# Patient Record
Sex: Female | Born: 1955
Health system: Southern US, Community
[De-identification: ages and names within clinical notes are randomized; demographics above are authoritative.]

## PROBLEM LIST (undated history)

## (undated) DIAGNOSIS — T464X5A Adverse effect of angiotensin-converting-enzyme inhibitors, initial encounter: Secondary | ICD-10-CM

## (undated) DIAGNOSIS — K5909 Other constipation: Secondary | ICD-10-CM

## (undated) DIAGNOSIS — E669 Obesity, unspecified: Secondary | ICD-10-CM

## (undated) DIAGNOSIS — D649 Anemia, unspecified: Secondary | ICD-10-CM

## (undated) DIAGNOSIS — M1990 Unspecified osteoarthritis, unspecified site: Secondary | ICD-10-CM

## (undated) DIAGNOSIS — R058 Other specified cough: Secondary | ICD-10-CM

## (undated) DIAGNOSIS — R05 Cough: Secondary | ICD-10-CM

## (undated) DIAGNOSIS — I10 Essential (primary) hypertension: Secondary | ICD-10-CM

## (undated) HISTORY — DX: Adverse effect of angiotensin-converting-enzyme inhibitors, initial encounter: T46.4X5A

## (undated) HISTORY — DX: Other constipation: K59.09

## (undated) HISTORY — DX: Essential (primary) hypertension: I10

## (undated) HISTORY — DX: Obesity, unspecified: E66.9

## (undated) HISTORY — DX: Other specified cough: R05.8

## (undated) HISTORY — PX: COLONOSCOPY: SHX174

## (undated) HISTORY — DX: Cough: R05

---

## 2000-07-12 ENCOUNTER — Emergency Department (HOSPITAL_COMMUNITY): Admission: EM | Admit: 2000-07-12 | Discharge: 2000-07-13 | Payer: Self-pay

## 2000-07-14 ENCOUNTER — Encounter: Payer: Self-pay | Admitting: Family Medicine

## 2000-07-14 ENCOUNTER — Encounter: Admission: RE | Admit: 2000-07-14 | Discharge: 2000-07-14 | Payer: Self-pay | Admitting: Family Medicine

## 2000-07-17 ENCOUNTER — Encounter: Admission: RE | Admit: 2000-07-17 | Discharge: 2000-08-17 | Payer: Self-pay | Admitting: Family Medicine

## 2000-07-21 ENCOUNTER — Encounter: Admission: RE | Admit: 2000-07-21 | Discharge: 2000-07-21 | Payer: Self-pay | Admitting: Family Medicine

## 2000-07-21 ENCOUNTER — Encounter: Payer: Self-pay | Admitting: Family Medicine

## 2002-03-17 ENCOUNTER — Emergency Department (HOSPITAL_COMMUNITY): Admission: EM | Admit: 2002-03-17 | Discharge: 2002-03-17 | Payer: Self-pay | Admitting: Emergency Medicine

## 2002-03-17 ENCOUNTER — Encounter: Payer: Self-pay | Admitting: Emergency Medicine

## 2002-03-28 ENCOUNTER — Other Ambulatory Visit: Admission: RE | Admit: 2002-03-28 | Discharge: 2002-03-28 | Payer: Self-pay | Admitting: Family Medicine

## 2003-03-17 ENCOUNTER — Ambulatory Visit (HOSPITAL_BASED_OUTPATIENT_CLINIC_OR_DEPARTMENT_OTHER): Admission: RE | Admit: 2003-03-17 | Discharge: 2003-03-17 | Payer: Self-pay | Admitting: Specialist

## 2003-10-13 ENCOUNTER — Emergency Department (HOSPITAL_COMMUNITY): Admission: EM | Admit: 2003-10-13 | Discharge: 2003-10-13 | Payer: Self-pay | Admitting: Emergency Medicine

## 2004-10-17 ENCOUNTER — Emergency Department (HOSPITAL_COMMUNITY): Admission: EM | Admit: 2004-10-17 | Discharge: 2004-10-17 | Payer: Self-pay | Admitting: Emergency Medicine

## 2004-10-18 ENCOUNTER — Ambulatory Visit: Payer: Self-pay | Admitting: Internal Medicine

## 2004-10-26 ENCOUNTER — Ambulatory Visit: Payer: Self-pay | Admitting: Internal Medicine

## 2006-03-23 ENCOUNTER — Emergency Department (HOSPITAL_COMMUNITY): Admission: EM | Admit: 2006-03-23 | Discharge: 2006-03-24 | Payer: Self-pay | Admitting: Emergency Medicine

## 2006-03-29 ENCOUNTER — Ambulatory Visit: Payer: Self-pay | Admitting: Internal Medicine

## 2006-05-09 ENCOUNTER — Ambulatory Visit: Payer: Self-pay | Admitting: Internal Medicine

## 2006-05-11 ENCOUNTER — Ambulatory Visit: Payer: Self-pay | Admitting: Internal Medicine

## 2006-05-11 ENCOUNTER — Other Ambulatory Visit: Admission: RE | Admit: 2006-05-11 | Discharge: 2006-05-11 | Payer: Self-pay | Admitting: Internal Medicine

## 2007-06-25 LAB — CONVERTED CEMR LAB: Pap Smear: NORMAL

## 2007-08-29 ENCOUNTER — Emergency Department (HOSPITAL_COMMUNITY): Admission: EM | Admit: 2007-08-29 | Discharge: 2007-08-29 | Payer: Self-pay | Admitting: Emergency Medicine

## 2007-11-16 ENCOUNTER — Encounter: Admission: RE | Admit: 2007-11-16 | Discharge: 2007-11-16 | Payer: Self-pay | Admitting: Family Medicine

## 2008-03-07 HISTORY — PX: OTHER SURGICAL HISTORY: SHX169

## 2008-03-10 ENCOUNTER — Inpatient Hospital Stay (HOSPITAL_COMMUNITY): Admission: RE | Admit: 2008-03-10 | Discharge: 2008-03-13 | Payer: Self-pay | Admitting: Orthopedic Surgery

## 2008-03-12 ENCOUNTER — Encounter (INDEPENDENT_AMBULATORY_CARE_PROVIDER_SITE_OTHER): Payer: Self-pay | Admitting: Orthopedic Surgery

## 2008-03-12 ENCOUNTER — Ambulatory Visit: Payer: Self-pay | Admitting: Vascular Surgery

## 2008-04-01 ENCOUNTER — Encounter (INDEPENDENT_AMBULATORY_CARE_PROVIDER_SITE_OTHER): Payer: Self-pay | Admitting: *Deleted

## 2008-05-18 ENCOUNTER — Emergency Department (HOSPITAL_COMMUNITY): Admission: EM | Admit: 2008-05-18 | Discharge: 2008-05-18 | Payer: Self-pay | Admitting: Emergency Medicine

## 2008-06-19 ENCOUNTER — Ambulatory Visit: Payer: Self-pay | Admitting: Internal Medicine

## 2008-06-19 ENCOUNTER — Ambulatory Visit (HOSPITAL_BASED_OUTPATIENT_CLINIC_OR_DEPARTMENT_OTHER): Admission: RE | Admit: 2008-06-19 | Discharge: 2008-06-19 | Payer: Self-pay | Admitting: Internal Medicine

## 2008-06-19 DIAGNOSIS — R030 Elevated blood-pressure reading, without diagnosis of hypertension: Secondary | ICD-10-CM

## 2008-06-19 DIAGNOSIS — K5909 Other constipation: Secondary | ICD-10-CM | POA: Insufficient documentation

## 2008-06-19 LAB — HM MAMMOGRAPHY

## 2008-06-24 LAB — CONVERTED CEMR LAB: Pap Smear: NORMAL

## 2008-07-04 ENCOUNTER — Encounter: Payer: Self-pay | Admitting: Internal Medicine

## 2008-07-04 LAB — CONVERTED CEMR LAB
ALT: 27 units/L
AST: 76 units/L
Alkaline Phosphatase: 117 units/L
BUN: 6 mg/dL
Chloride: 106 meq/L
Cholesterol: 163 mg/dL
Creatinine, Ser: 0.68 mg/dL
Free T4: 1.31 ng/dL
HCT: 38.3 %
HDL: 46 mg/dL
Hemoglobin: 12.4 g/dL
LDL Cholesterol: 94 mg/dL
Platelets: 306 10*3/uL
Potassium: 4.5 meq/L
Sodium: 141 meq/L
TSH: 1.494 microintl units/mL
Total Bilirubin: 0.4 mg/dL
Total Protein: 7.8 g/dL
Triglycerides: 115 mg/dL
WBC: 7.1 10*3/uL

## 2008-08-07 ENCOUNTER — Ambulatory Visit: Payer: Self-pay | Admitting: Internal Medicine

## 2008-08-07 DIAGNOSIS — R740 Nonspecific elevation of levels of transaminase and lactic acid dehydrogenase [LDH]: Secondary | ICD-10-CM

## 2008-08-19 ENCOUNTER — Telehealth: Payer: Self-pay | Admitting: Internal Medicine

## 2008-08-19 ENCOUNTER — Ambulatory Visit: Payer: Self-pay | Admitting: Internal Medicine

## 2008-08-19 DIAGNOSIS — K921 Melena: Secondary | ICD-10-CM

## 2008-08-19 LAB — CONVERTED CEMR LAB: Fecal Occult Bld: POSITIVE — AB

## 2008-08-25 ENCOUNTER — Encounter: Payer: Self-pay | Admitting: Internal Medicine

## 2008-09-15 ENCOUNTER — Ambulatory Visit: Payer: Self-pay | Admitting: Internal Medicine

## 2008-09-15 LAB — CONVERTED CEMR LAB
ALT: 5 units/L (ref 0–35)
AST: 16 units/L (ref 0–37)
BUN: 4 mg/dL — ABNORMAL LOW (ref 6–23)
CO2: 28 meq/L (ref 19–32)
Calcium: 9.1 mg/dL (ref 8.4–10.5)
Chloride: 107 meq/L (ref 96–112)
Creatinine, Ser: 0.7 mg/dL (ref 0.4–1.2)
GFR calc Af Amer: 113 mL/min
GFR calc non Af Amer: 94 mL/min
Glucose, Bld: 112 mg/dL — ABNORMAL HIGH (ref 70–99)
Hgb A1c MFr Bld: 5.8 % (ref 4.6–6.0)
Potassium: 4 meq/L (ref 3.5–5.1)
Sodium: 140 meq/L (ref 135–145)

## 2008-09-16 ENCOUNTER — Ambulatory Visit: Payer: Self-pay | Admitting: Gastroenterology

## 2008-09-16 DIAGNOSIS — K5900 Constipation, unspecified: Secondary | ICD-10-CM

## 2008-09-16 DIAGNOSIS — R195 Other fecal abnormalities: Secondary | ICD-10-CM

## 2008-09-19 ENCOUNTER — Ambulatory Visit: Payer: Self-pay | Admitting: Gastroenterology

## 2008-09-19 LAB — HM COLONOSCOPY

## 2008-10-06 ENCOUNTER — Ambulatory Visit: Payer: Self-pay | Admitting: Internal Medicine

## 2008-10-06 DIAGNOSIS — I10 Essential (primary) hypertension: Secondary | ICD-10-CM | POA: Insufficient documentation

## 2008-10-06 DIAGNOSIS — R609 Edema, unspecified: Secondary | ICD-10-CM | POA: Insufficient documentation

## 2008-11-12 ENCOUNTER — Telehealth (INDEPENDENT_AMBULATORY_CARE_PROVIDER_SITE_OTHER): Payer: Self-pay | Admitting: *Deleted

## 2009-01-20 ENCOUNTER — Encounter: Payer: Self-pay | Admitting: Internal Medicine

## 2009-01-20 ENCOUNTER — Telehealth (INDEPENDENT_AMBULATORY_CARE_PROVIDER_SITE_OTHER): Payer: Self-pay | Admitting: *Deleted

## 2009-01-21 ENCOUNTER — Ambulatory Visit: Payer: Self-pay | Admitting: Internal Medicine

## 2009-04-24 ENCOUNTER — Ambulatory Visit: Payer: Self-pay | Admitting: Internal Medicine

## 2009-04-24 ENCOUNTER — Telehealth: Payer: Self-pay | Admitting: Internal Medicine

## 2009-04-24 DIAGNOSIS — N951 Menopausal and female climacteric states: Secondary | ICD-10-CM | POA: Insufficient documentation

## 2009-06-19 ENCOUNTER — Ambulatory Visit: Payer: Self-pay | Admitting: Internal Medicine

## 2009-06-19 LAB — CONVERTED CEMR LAB
ALT: 11 units/L (ref 0–35)
AST: 23 units/L (ref 0–37)
Albumin: 3.5 g/dL (ref 3.5–5.2)
Alkaline Phosphatase: 67 units/L (ref 39–117)
BUN: 6 mg/dL (ref 6–23)
Bilirubin, Direct: 0 mg/dL (ref 0.0–0.3)
CO2: 26 meq/L (ref 19–32)
Calcium: 8.9 mg/dL (ref 8.4–10.5)
Chloride: 106 meq/L (ref 96–112)
Cholesterol: 168 mg/dL (ref 0–200)
Creatinine, Ser: 0.7 mg/dL (ref 0.4–1.2)
FSH: 22.5 milliintl units/mL
GFR calc non Af Amer: 112.74 mL/min (ref >60)
Glucose, Bld: 106 mg/dL — ABNORMAL HIGH (ref 70–99)
HDL: 51.9 mg/dL (ref >39.00)
Hgb A1c MFr Bld: 5.8 % (ref 4.6–6.5)
LDL Cholesterol: 103 mg/dL — ABNORMAL HIGH (ref 0–99)
LH: 27.26 milliintl units/mL
Potassium: 3.6 meq/L (ref 3.5–5.1)
Sodium: 138 meq/L (ref 135–145)
TSH: 1.51 microintl units/mL (ref 0.35–5.50)
Total Bilirubin: 0.6 mg/dL (ref 0.3–1.2)
Total CHOL/HDL Ratio: 3
Total Protein: 7 g/dL (ref 6.0–8.3)
Triglycerides: 65 mg/dL (ref 0.0–149.0)
VLDL: 13 mg/dL (ref 0.0–40.0)

## 2009-06-24 ENCOUNTER — Ambulatory Visit: Payer: Self-pay | Admitting: Internal Medicine

## 2010-02-01 ENCOUNTER — Ambulatory Visit: Payer: Self-pay | Admitting: Internal Medicine

## 2010-02-02 ENCOUNTER — Telehealth: Payer: Self-pay | Admitting: Internal Medicine

## 2010-05-19 ENCOUNTER — Ambulatory Visit: Payer: Self-pay | Admitting: Internal Medicine

## 2010-05-19 LAB — CONVERTED CEMR LAB
BUN: 14 mg/dL (ref 6–23)
CO2: 25 meq/L (ref 19–32)
Calcium: 10.4 mg/dL (ref 8.4–10.5)
Chloride: 105 meq/L (ref 96–112)
Creatinine, Ser: 0.9 mg/dL (ref 0.40–1.20)
Glucose, Bld: 90 mg/dL (ref 70–99)
Hep B S Ab: POSITIVE — AB
Hgb A1c MFr Bld: 5.8 % — ABNORMAL HIGH (ref <5.7)
Potassium: 4.5 meq/L (ref 3.5–5.3)
Sodium: 141 meq/L (ref 135–145)
Varicella IgG: 2.25 — ABNORMAL HIGH

## 2010-05-21 ENCOUNTER — Encounter: Payer: Self-pay | Admitting: Internal Medicine

## 2010-05-26 ENCOUNTER — Ambulatory Visit: Payer: Self-pay | Admitting: Internal Medicine

## 2010-05-28 ENCOUNTER — Encounter: Payer: Self-pay | Admitting: Internal Medicine

## 2010-06-02 ENCOUNTER — Ambulatory Visit: Payer: Self-pay | Admitting: Internal Medicine

## 2010-11-02 ENCOUNTER — Ambulatory Visit: Payer: Self-pay | Admitting: Internal Medicine

## 2010-11-09 ENCOUNTER — Ambulatory Visit: Admit: 2010-11-09 | Payer: Self-pay | Admitting: Internal Medicine

## 2010-11-27 ENCOUNTER — Encounter: Payer: Self-pay | Admitting: Internal Medicine

## 2010-11-28 ENCOUNTER — Encounter: Payer: Self-pay | Admitting: Internal Medicine

## 2010-12-09 NOTE — Assessment & Plan Note (Signed)
Summary: t b test/mhf  Nurse Visit   Allergies: 1)  Benazepril Hcl (Benazepril Hcl)  Immunizations Administered:  PPD Skin Test:    Vaccine Type: PPD    Site: right forearm    Mfr: Sanofi Pasteur    Dose: 0.1 ml    Route: ID    Given by: Glendell Docker CMA    Exp. 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

## 2010-12-09 NOTE — Assessment & Plan Note (Signed)
Summary: TB Skin Test Recheck  Nurse Visit   Allergies: 1)  Benazepril Hcl (Benazepril Hcl)  PPD Results    Date of reading: 05/21/2010    Results: < 5mm    Interpretation: negative  Primary Care Provider:  Dondra Spry DO  CC:  Tb Skin Test Recheck.  History of Present Illness: The patient presented after 48 hours to check the injection site for positive or negative reaction  Injection site examination: No firm bump forms at the test site. Slightly reddish appearance and daimeter was smaller than 5mm.  Assessment & Plan:  Negative TB skin test. Patient was counseled to call if experience any irritation of site.

## 2010-12-09 NOTE — Progress Notes (Signed)
Summary: Tickle in Throat  ---- Converted from flag ---- ---- 02/02/2010 1:37 PM, D. Thomos Lemons DO wrote: call pt - I suggest she take otc claritin or zyrtec for tickle in her throat ------------------------------  Phone Note Outgoing Call   Call placed by: Glendell Docker CMA,  February 02, 2010 3:00 PM Call placed to: Patient Summary of Call: patient advised per Dr Artist Pais instructions Initial call taken by: Glendell Docker CMA,  February 02, 2010 3:01 PM

## 2010-12-09 NOTE — Assessment & Plan Note (Signed)
Summary: tb test/mhf  Nurse Visit  CC: Pt here for 2nd TB test. Will return Friday to have test read.   Allergies: 1)  Benazepril Hcl (Benazepril Hcl)  Immunizations Administered:  PPD Skin Test:    Vaccine Type: PPD    Site: right forearm    Mfr: Sanofi Pasteur    Dose: 0.1 ml    Route: ID    Given by: Mervin Kung CMA (AAMA)    Exp. Date: 09/05/2011    Lot #: [id number]  Orders Added: 1)  TB Skin Test [86580] 2)  Admin 1st Vaccine [phone]

## 2010-12-09 NOTE — Assessment & Plan Note (Signed)
Summary: CPX PATIENT FASTING/MHF   Vital Signs:  Patient profile:   55 year old female Height:      67 inches Weight:      247.25 pounds BMI:     38.86 O2 Sat:      100 % on Room air Temp:     98.1 degrees F oral Pulse rate:   64 / minute Pulse rhythm:   regular Resp:     16 per minute BP sitting:   110 / 70  (left arm) Cuff size:   large  Vitals Entered By: Glendell Docker CMA (June 02, 2010 10:37 AM)  O2 Flow:  Room air  Primary Care Provider:  D. Thomos Lemons DO   History of Present Illness: 55 y/o female with hx of Htn for routine cpx   Preventive Screening-Counseling & Management  Alcohol-Tobacco     Alcohol drinks/day: 0     Smoking Status: never  Caffeine-Diet-Exercise     Caffeine use/day: 2 beverages daily     Does Patient Exercise: yes     Type of exercise: walking   Allergies: 1)  Benazepril Hcl (Benazepril Hcl)  Past History:  Past Medical History: Obesity Hypertension  Chronic consiptation    Hemorrhoids  ACE cough  Past Surgical History: Left knee replacement 03/2008 C-section x 3        Family History: Father deceased in his late 18s secondary to alcoholism, HTN Mother is age 61-hypertension, stroke, kidney failure, brain aneurysm (mother is completely bedridden and patient is primary caregiver)  grandparents with HTN        Social History: Occupation:  Works as a Lawyer Married (second marriage) 2 children from her first marriage and 1 child from second marriage Never Smoked Alcohol use-no       Caffeine use/day:  2 beverages daily Does Patient Exercise:  yes  Physical Exam  General:  alert, well-developed, and well-nourished.   Eyes:  pupils equal, pupils round, and pupils reactive to light.   Ears:  R ear normal and L ear normal.   Mouth:  pharynx pink and moist.   Neck:  No deformities, masses, or tenderness noted.no carotid bruits.   Lungs:  Normal respiratory effort, chest expands symmetrically. Lungs are clear to  auscultation, no crackles or wheezes. Heart:  Normal rate and regular rhythm. S1 and S2 normal without gallop, murmur, click, rub or other extra sounds. Extremities:  trace left pedal edema and trace right pedal edema.   Neurologic:  cranial nerves II-XII intact and gait normal.   Psych:  normally interactive, good eye contact, not anxious appearing, and not depressed appearing.     Impression & Recommendations:  Problem # 1:  HEALTH MAINTENANCE EXAM (ICD-V70.0) Reviewed adult health maintenance protocols.  Orders:  EKG w/ Interpretation (93000)  Mammogram: normal (06/19/2008) Pap smear: normal (06/24/2008) Colonoscopy: Location:  [facility].   (09/19/2008) Td Booster: given (02/25/2008)   Flu Vax: Historical (07/07/2009)   Chol: 168 (06/19/2009)   HDL: 51.90 (06/19/2009)   LDL: 103 (06/19/2009)   TG: 65.0 (06/19/2009) TSH: 1.51 (06/19/2009)   HgbA1C: 5.8 (05/19/2010)     Problem # 2:  HYPERTENSION (ICD-401.9) Assessment: Improved  Her updated medication list for this problem includes:    Diovan Hct 160-12.5 Mg Tabs (Valsartan-hydrochlorothiazide) ..... One by mouth once daily  BP today: 110/70 Prior BP: 160/90 (02/01/2010)  Labs Reviewed: K+: 4.5 (05/19/2010) Creat: : 0.90 (05/19/2010)   Chol: 168 (06/19/2009)   HDL: 51.90 (06/19/2009)   LDL:  103 (06/19/2009)   TG: 65.0 (06/19/2009)  Complete Medication List: 1)  Diovan Hct 160-12.5 Mg Tabs (Valsartan-hydrochlorothiazide) .... One by mouth once daily  Other Orders: Mammogram (Screening) (Mammo)  Patient Instructions: 1)  Please schedule a follow-up appointment in 6 months. 2)  BMP prior to visit, ICD-9:  401.9 3)  HbgA1C prior to visit, ICD-9:  790.29 4)  FLP:  401.9 5)  Please return for lab work one (1) week before your next appointment.  Prescriptions: DIOVAN HCT 160-12.5 MG TABS (VALSARTAN-HYDROCHLOROTHIAZIDE) one by mouth once daily  #90 x 3   Entered and Authorized by:   D. Thomos Lemons DO    Signed by:   D. Thomos Lemons DO on 06/02/2010   Method used:   Electronically to        [facility]* (retail)       [address]       [place], [place]  [id number]       Ph: [phone]       Fax: [phone]   RxID:   [id number]     Current Allergies (reviewed today): BENAZEPRIL HCL (BENAZEPRIL HCL)

## 2010-12-09 NOTE — Assessment & Plan Note (Signed)
Summary: TB skin read--nurse visit  Nurse Visit   Primary Care Provider:  Dondra Spry DO   History of Present Illness: CC:  TB skin test recheck  The patient presented after 48 hours to check the injection site for positive or negative reaction.  Injection site examination: No firm bump forms at the test site.  Slightly reddish appearance and diameter was smaller than 5mm.  Assessment & Plan: Negative TB skin test. Patient was counselled to call if she experiences any irritation of site.  Nicki Guadalajara Fergerson CMA Duncan Dull)  May 28, 2010 11:36 AM      Allergies: 1)  Benazepril Hcl (Benazepril Hcl)  PPD Results    Date of reading: 05/28/2010    Results: < 5mm    Interpretation: negative

## 2010-12-09 NOTE — Assessment & Plan Note (Signed)
Summary: b p meds not working/mhf, resched.- jr   Vital Signs:  Patient profile:   55 year old female Height:      67 inches Weight:      258.50 pounds BMI:     40.63 O2 Sat:      100 % on Room air Temp:     97.7 degrees F oral Pulse rate:   60 / minute Pulse rhythm:   regular Resp:     16 per minute BP sitting:   160 / 90  (right arm) Cuff size:   large  Vitals Entered By: Glendell Docker CMA (February 01, 2010 10:46 AM)  O2 Flow:  Room air CC: Rm 3 Follow up  blood pressure   Primary Care Provider:  DThomos Lemons DO  CC:  Rm 3 Follow up  blood pressure.  History of Present Illness: 55 y/o AA female for f/u re:  Htn.   c/o shortness of breath and tingling in throat with blood pressure medication  she stopped taking medication last week pt prev switched from ACE to Azor due to cough and throat irritation she denies tongue or lip swelling she gained 15 lbs since prev visit. no reports more stress at home - stress eating  Allergies: 1)  Benazepril Hcl (Benazepril Hcl)  Past History:  Past Medical History: Obesity Hypertension  Chronic consiptation   Hemorrhoids ACE cough  Past Surgical History: Left knee replacement 03/2008 C-section x 3      Family History: Father deceased in his late 67s secondary to alcoholism, HTN Mother is age 75-hypertension, stroke, kidney failure, brain aneurysm (mother is completely bedridden and patient is primary caregiver)  grandparents with HTN      Social History: Occupation:  Works as a Lawyer Married (second marriage) 2 children from her first marriage and 1 child from second marriage Never Smoked Alcohol use-no       Physical Exam  General:  alert, well-developed, and well-nourished.   Eyes:  pupils equal, pupils round, and pupils reactive to light.   Ears:  R ear normal and L ear normal.   Mouth:  pharynx pink and moist.   Neck:  No deformities, masses, or tenderness noted.no carotid bruits.   Lungs:  Normal  respiratory effort, chest expands symmetrically. Lungs are clear to auscultation, no crackles or wheezes. Heart:  Normal rate and regular rhythm. S1 and S2 normal without gallop, murmur, click, rub or other extra sounds. Extremities:  trace left pedal edema and trace right pedal edema.     Impression & Recommendations:  Problem # 1:  HYPERTENSION (ICD-401.9) Assessment Deteriorated She stopped Azor 2 weeks ago due to throat irritation.   I doubt symptoms from BP medication.  throat symptoms likely from allergic rhinitis.   switch to diovan / hct.  I encouraged compliance The following medications were removed from the medication list:    Azor 5-20 Mg Tabs (Amlodipine-olmesartan) ..... One by mouth qd Her updated medication list for this problem includes:    Diovan Hct 160-12.5 Mg Tabs (Valsartan-hydrochlorothiazide) ..... One by mouth once daily  BP today: 160/90 Prior BP: 142/80 (06/24/2009)  Labs Reviewed: K+: 3.6 (06/19/2009) Creat: : 0.7 (06/19/2009)   Chol: 168 (06/19/2009)   HDL: 51.90 (06/19/2009)   LDL: 103 (06/19/2009)   TG: 65.0 (06/19/2009)  Complete Medication List: 1)  Diovan Hct 160-12.5 Mg Tabs (Valsartan-hydrochlorothiazide) .... One by mouth once daily  Patient Instructions: 1)  Please schedule a follow-up appointment in 2 months. 2)  BMP prior to visit, ICD-9:  401.9 3)  HbgA1C prior to visit, ICD-9: 790.29 4)  Please return for lab work one (1) week before your next appointment.  Prescriptions: DIOVAN HCT 160-12.5 MG TABS (VALSARTAN-HYDROCHLOROTHIAZIDE) one by mouth once daily  #30 x 3   Entered and Authorized by:   D. Thomos Lemons DO   Signed by:   D. Thomos Lemons DO on 02/01/2010   Method used:   Electronically to        [facility]* (retail)       [address]       [place], [place]  [id number]       Ph: [phone]       Fax: [phone]   RxID:   [id number]   Current Allergies (reviewed today): BENAZEPRIL HCL (BENAZEPRIL  HCL)   Immunization History:  Influenza Immunization History:    Influenza:  historical (07/07/2009)

## 2011-03-22 NOTE — Op Note (Signed)
NAMETIAMARIE, FURNARI              ACCOUNT NO.:  [id number]   MEDICAL RECORD NO.:  [id number]          PATIENT TYPE:  INP   LOCATION:  5020                         FACILITY:  [facility]   PHYSICIAN:  Feliberto Gottron. Turner Daniels, M.D.   DATE OF BIRTH:  Jul 26, 1956   DATE OF PROCEDURE:  03/10/2008  DATE OF DISCHARGE:                               OPERATIVE REPORT   PREOPERATIVE DIAGNOSIS:  End-stage arthritis of left knee.   POSTOPERATIVE DIAGNOSIS:  End-stage arthritis of left knee.   PROCEDURE:  Left total knee arthroplasty using DePuy sigma RP  components, three left femur, four tibia, 10-mm sigma RP bearing, and a  35-mm patellar button.  All components cemented double batch of DePuy HV  cement with 1500 mg of Zinacef.   SURGEON:  Feliberto Gottron.  Turner Daniels, MD   FIRST ASSISTANT:  Shirl Harris PA-C.   ANESTHETIC:  General endotracheal.   ESTIMATED BLOOD LOSS:  Minimal.   FLUID REPLACEMENT:  1200 mL of crystalloid.   DRAINS PLACED:  Two medium Hemovacs, Foley catheter.   URINE OUTPUT:  300 mL.   TOURNIQUET TIME:  1 hour and 30 minutes.   INDICATIONS FOR PROCEDURE:  Morbidly obese 55 year old woman with end-  stage arthritis of left greater than right knee, who has failed  conservative treatment with anti-inflammatory medicines, attempts at  weight loss, physical therapy, and judicious use of narcotics.  She has  disabling pain and limited function and desires elective left total knee  arthroplasty having failed conservative measures.  Risks and benefits of  surgery were discussed, questions answered.   DESCRIPTION OF PROCEDURE:  The patient was identified by armband, taken  to the operating room at [facility], where the appropriate  anesthetic monitors were attached and general endotracheal anesthesia  induced with the patient in supine position.  She received 2 gm of Ancef  preoperatively.  An extra wide Zimmer tourniquet was placed high.  The  left thigh and left lower extremity  prepped and draped in usual sterile  fashion from the ankle to the tourniquet after inserting a Foley  catheter.  A lateral post and foot positioner had also been applied to  the table.  The limb was then wrapped with an Esmarch bandage after a  time-out procedure was performed.  The knee flexed to 90 degrees and the  tourniquet inflated to 350 mmHg.  I began the procedure by making  anterior midline incision starting 1 handbreadth below the patella and  ending 1 handbreadth above the patella.  Bleeders in the skin and  subcutaneous tissue were identified and cauterized.  The transverse  retinaculum over the patella was then incised, reflected medially and  laterally because of the need to tuck the patella beneath the abundant  subcutaneous tissue.  A medial parapatellar arthrotomy was then  performed.  The prepatellar fat pad resected.  The superficial medial  collateral ligament was elevated off the proximal medial flare of the  tibia using a electrocautery around of the posteromedial corner.  The  patella was then everted and tucked underneath the lateral fat pad.  The  knee  was flexed up.  This exposed the notch and cruciate ligaments which  were then resected with the electrocautery.  The notch was widened with  an 1/2-inch wide osteotome and the tibial spines removed.  This allowed  external rotation of the tibia which was then subluxed forward using a  posteromedial Z retractor, a McCullough retractor through the notch, and  a lateral Personal assistant.  We then entered the proximal tibia with a  step-drill followed by the IM rod and a 0-degree posterior slope cutting  guide, removed about 7 mm of bone medially because of the bone-on-bone  arthritic changes, and about 10 mm of bone laterally after pinning the  cutting guide into place and protecting posterior structures with the Z,  the [place], and the [facility].  We then entered the distal femur 2 mm  anterior to the PCL origin  with the step-drill followed by  intramedullary rod and a 5-degree left distal femoral cutting guide set  at 11 mm was pinned along the epicondylar axis and the distal femoral  cut accomplished.  We then sized for a #3 left femoral component and  placed the pins in 3 degrees of external rotation followed by the  chamfer cutting guide for a #3.  The anterior and posterior chamfer cuts  were then accomplished without difficulty as well as the box cut with  the box cutting guide.  The patella was measured at 23 mm.  We felt 35  button would be about right and set the cutting guide at 14 mm and  removed the posterior 9 mm of the patella, sized for a 35 button and  drilled the patella.  At this point, the knee was placed in full  extension with traction.  The rest of the menisci were removed.  A  sizing block for a 10-mm spacer was inserted and had the correct tension  medially and laterally.  At this point, the knee was once again  hyperflexed and the proximal tibia exposed using the [place], the  [place], and the Z.  We sized for #4 tibial baseplate which was then  pinned into place followed by the smokestack cutting guide and the  conical reamer.  The Delta fin keel punch with a bullet tip was then  inserted.  The three left trial femoral component was hammered onto the  femur and the lugs were drilled.  Trial reduction was then performed  with a 10-mm sigma RP spacer and a 35 patellar button which was noted to  track with no thumb pressure.  Ligamentous stability was excellent in  both 0, 45, and 90 degrees of flexion.  Pass that mark adipose tissue  from behind her thigh started to compress a little bit and was hard to  assess the ligament tension but it appeared to be good.  The trial  components were then removed and all bony surfaces were water picked  clean and dried with suction and sponges.  At the back table, a double  batch of DePuy HV cement was mixed with 1500 mg of Zinacef  and applied  to all bony and metallic mating surfaces of the real components except  for the posterior condyles of the femur itself.  In order, we hammered  into place a #4 tibial baseplate and removed the excess cement.  A three  left femoral component removed excess cement and then snapped in a 10-mm  sigma RP rotating platform spacer.  The patellar button was then  squeezed into  place 35 mm and held with a clamp.  As pressure was  applied to the foot with the knee in extension, the cement cured.  The  wound was once again irrigated out with normal saline solution.  Medium  Hemovac drains were placed deep in the wound, and after the cement had  cured, we checked our stability one more time.  Parapatellar arthrotomy  was then closed with running #1 Vicryl suture, the subcutaneous tissue  with 0 and 2-0 undyed Vicryl suture, and the skin with skin staples.  A  dressing of Xeroform, 4x4 dressing, sponges, Webril, and Ace wrap were  applied.  The tourniquet was let down.  The patient was then awakened  and taken to the recovery room without difficulty.      Feliberto Gottron. Turner Daniels, M.D.  Electronically Signed     FJR/MEDQ  D:  03/10/2008  T:  03/10/2008  Job:  [id number]

## 2011-03-25 NOTE — Op Note (Signed)
NAME:  Lisa Mcconnell, Lisa Mcconnell                        ACCOUNT NO.:  [id number]   MEDICAL RECORD NO.:  [id number]                   PATIENT TYPE:  AMB   LOCATION:  DSC                                  FACILITY:  [facility]   PHYSICIAN:  Ronnell Guadalajara, M.D.                DATE OF BIRTH:  July 04, 1956   DATE OF PROCEDURE:  03/17/2003  DATE OF DISCHARGE:                                 OPERATIVE REPORT   PREOPERATIVE DIAGNOSIS:  Loose body, left knee.   POSTOPERATIVE DIAGNOSIS:  Thickened fibrous fat pad, left knee.   PROCEDURE:  Partial fat pad removal.   DESCRIPTION OF PROCEDURE:  After suitable general anesthesia, the knee is  exsanguinated with an Esmarch, placed into a leg holder, prepped and draped  routinely.  Exsanguination is done with the leg lifted up __________ so we  could get some good exsanguination.  Because of her large size, a tourniquet  inflated to 480 mmHg.  A lateral parapatellar inflow portal is created an  anterolateral portal for the scope, and an anteromedial portal made under  direct visualization.  The suprapatellar pouch reveals some synovial  scarring, but both medial and lateral gutters show no evidence of any loose  body.  The underside of the patella shows little wear.  There is no discrete  area where a loose body may have come off.  On the medial side the medial  femoral condyle shows some mild wear, nothing down to bare bone, nothing  where a loose body may have come off, and the tibia shows even less wear,  which is good.  A little bit of ligamentum mucosum is present, and the  lateral meniscus is inspected and looks good all the way around as well.  Some of the fat pad is removed from the front of the lateral meniscus, where  the loose body was seen on MRI.  It was thought that this was a thickened  area of scar tissue mixed in with a little bit of the fat pad.  This was  trimmed out, and this was thought to be the problem.  The rest of the  ligamentum  mucosum was released as well.  I went back up and looked in the  gutters again.  I brought in a separate arthroscopy cannula and with the  scope in the medial side now as we switched it to the other side, we  completed the debridement of some of this fatty scar tissue in front of the  lateral joint and then put the new arthroscopy cannula on the lateral side  into the posterior compartment adjacent to the ACL.  We switched the camera  into that and got a look medially and laterally, ballotted back there, put  an inflow portal onto it, could see nothing that looked like a loose body.  We tried to do the same thing to the medial side, and it was just  too tight  to get anything back in there.  There was no evidence of any true loose body  throughout the knee, and nothing really showed on the plain x-ray.  What was  seen on the MRI was thought to be this area of thickened scar tissue.   Her symptoms had not included a lot of medial joint pain at this time but  more of the knee wanting to buckle in class with her, and perhaps this  thickened scar was the problem.  At the end 20 mL of 0.5% Marcaine with  adrenalin were put into the knee.  The three puncture wounds were sealed.  The tourniquet was up a short time and released.  A compression dressing.  She goes to recovery in good condition.                                               Ronnell Guadalajara, M.D.    PC/MEDQ  D:  03/17/2003  T:  03/18/2003  Job:  [id number]

## 2011-03-25 NOTE — Discharge Summary (Signed)
NAMEFABIANA, Lisa Mcconnell              ACCOUNT NO.:  [id number]   MEDICAL RECORD NO.:  [id number]          PATIENT TYPE:  INP   LOCATION:  5020                         FACILITY:  [facility]   PHYSICIAN:  Feliberto Gottron. Lisa Mcconnell, M.D.   DATE OF BIRTH:  1956-05-20   DATE OF ADMISSION:  03/10/2008  DATE OF DISCHARGE:  03/13/2008                               DISCHARGE SUMMARY   CHIEF COMPLAINT:  Left knee pain and dysfunction.   HISTORY OF PRESENT ILLNESS:  Ms. Lisa Mcconnell is a 55 year old lady with  unremitting left knee pain.  She has failed all conservative treatment  including NSAIDs, narcotic medicines, cortisone injections, and  viscosupplementation.  She desires a surgical intervention at this time.   PAST MEDICAL HISTORY:  Unremarkable.   PAST SURGICAL HISTORY:  She has had 3 C-sections in 1987, 1988, and  1994.   SOCIAL HISTORY:  She is a nonsmoker and does not use alcohol.  She is  married.   FAMILY HISTORY:  Significant for coronary artery disease, diabetes  mellitus, and hypertension.   ALLERGIES:  She has no known drug allergies.   MEDICATIONS:  She takes no medicines.   PHYSICAL EXAMINATION:  Examination of the left knee demonstrates the  patient to have tenderness with range of motion and a varus angulation.  Her range of motion is estimated to be 5-115 degrees.   X-rays of the left knee demonstrate bone-on-bone arthritic changes in  the medial compartment.   PREOPERATIVE LABS:  White blood cells 8.8, platelets 194, hemoglobin  10.7, and hematocrit 31.3.  Sodium 133, potassium 3.7.  PT 14.5, INR  1.1.  Her urinalysis was within normal limits.   HOSPITAL COURSE:  On Mar 10, 2008, Ms. Lisa Mcconnell underwent a left total  knee arthroplasty.  She tolerated the procedure well.  On the first  postoperative day, her drains were removed, and her Foley was taken out.  She was ambulating with physical therapy using a walker.  On the second  postoperative day, her dressing was changed, and  the wound did not show  any signs of drainage.  She was tolerating p.o. intake well, and denied  any nausea or vomiting.  She did complain of increased pain in the left  knee.  On the third postoperative day, she reported improvement in her  pain, she was ambulating and climbing stairs with physical therapy and  eating well, so she was discharged to her home.   DISPOSITION:  The patient will be discharged home on Mar 13, 2008.  Home  health will manage her wound care, physical therapy, and Coumadin.   DISCHARGE MEDICATIONS:  Will include:  1. Percocet 5 mg 1-2 tabs p.o. every 4 hours p.r.n. pain.  2. Coumadin 5 mg take as directed.   FINAL DIAGNOSIS:  Left knee end-stage degenerative joint disease.   She will be weightbearing as tolerated with a walker.  She will return  to the clinic to see Dr. Turner Mcconnell in 1 week.     ______________________________  Jarvis Morgan. Lisa Mcconnell, M.D.  Electronically Signed    JW/MEDQ  D:  03/17/2008  T:  03/18/2008  Job:  [id number]

## 2011-07-27 ENCOUNTER — Ambulatory Visit (INDEPENDENT_AMBULATORY_CARE_PROVIDER_SITE_OTHER)
Admission: RE | Admit: 2011-07-27 | Discharge: 2011-07-27 | Disposition: A | Discharge status: Home / Self Care | Payer: 59 | Source: Ambulatory Visit

## 2011-07-27 ENCOUNTER — Ambulatory Visit (INDEPENDENT_AMBULATORY_CARE_PROVIDER_SITE_OTHER): Payer: 59 | Admitting: Internal Medicine

## 2011-07-27 ENCOUNTER — Encounter: Payer: Self-pay | Admitting: Internal Medicine

## 2011-07-27 DIAGNOSIS — I10 Essential (primary) hypertension: Secondary | ICD-10-CM

## 2011-07-27 DIAGNOSIS — R110 Nausea: Secondary | ICD-10-CM

## 2011-07-27 DIAGNOSIS — R51 Headache: Secondary | ICD-10-CM

## 2011-07-27 LAB — HEPATIC FUNCTION PANEL
ALT: 16 U/L (ref 0–35)
Albumin: 3.9 g/dL (ref 3.5–5.2)
Bilirubin, Direct: 0.1 mg/dL (ref 0.0–0.3)
Total Bilirubin: 0.4 mg/dL (ref 0.3–1.2)

## 2011-07-27 LAB — CBC WITH DIFFERENTIAL/PLATELET
Basophils Absolute: 0 10*3/uL (ref 0.0–0.1)
Basophils Relative: 0.6 % (ref 0.0–3.0)
Eosinophils Absolute: 0.1 10*3/uL (ref 0.0–0.7)
Eosinophils Relative: 1.6 % (ref 0.0–5.0)
HCT: 39.8 % (ref 36.0–46.0)
Hemoglobin: 13.2 g/dL (ref 12.0–15.0)
Lymphocytes Relative: 48.8 % — ABNORMAL HIGH (ref 12.0–46.0)
Lymphs Abs: 3.3 10*3/uL (ref 0.7–4.0)
MCHC: 33.1 g/dL (ref 30.0–36.0)
MCV: 95.3 fl (ref 78.0–100.0)
Monocytes Absolute: 0.3 10*3/uL (ref 0.1–1.0)
Monocytes Relative: 5.1 % (ref 3.0–12.0)
Neutro Abs: 2.9 10*3/uL (ref 1.4–7.7)
Platelets: 186 10*3/uL (ref 150.0–400.0)
RBC: 4.17 Mil/uL (ref 3.87–5.11)
RDW: 13 % (ref 11.5–14.6)
WBC: 6.7 10*3/uL (ref 4.5–10.5)

## 2011-07-27 LAB — BASIC METABOLIC PANEL
BUN: 12 mg/dL (ref 6–23)
CO2: 30 mEq/L (ref 19–32)
Chloride: 104 mEq/L (ref 96–112)
Creatinine, Ser: 0.8 mg/dL (ref 0.4–1.2)
GFR: 91.89 mL/min (ref >60.00)
Glucose, Bld: 101 mg/dL — ABNORMAL HIGH (ref 70–99)
Potassium: 3.3 mEq/L — ABNORMAL LOW (ref 3.5–5.1)

## 2011-07-27 MED ORDER — ONDANSETRON HCL 4 MG PO TABS: 4.0000 mg | ORAL_TABLET | Freq: Three times a day (TID) | ORAL | Status: DC | PRN

## 2011-07-27 NOTE — Assessment & Plan Note (Signed)
54 y/o AA female with acute elevation of blood pressure that started Sunday.  Systolic blood pressure readings in the 160s to 170s. She is having associated headache, dizziness and nausea. Rule out acute intracranial abnormality. Obtain CT of brain without contrast.  EKG show NSR at 68 bpm.  No acute ST changes noted. Pt has not been compliant with taking her blood pressure medication. Restart Diovan/hydrochlorothiazide.  Samples provided.

## 2011-07-27 NOTE — Progress Notes (Signed)
  Subjective:    Patient ID: Lisa Mcconnell, female    DOB: 04-Jun-1956, 55 y.o.   MRN: [id number]  HPI  55 year old [place] American female presents with acute blood pressure elevation. It has been over one year since her last office visit. Patient has not been taking her prescription of Diovan/hydrochlorothiazide as prescribed. She has been taking her mother's dose of amlodipine. Patient describes episode of sudden blood pressure elevation on Sunday at 2 AM. She felt associated dizziness, drug on a sweat, and felt nauseated.  Patient also reports having BM.  Patient denies chest pain during this time but did experience headache. Episode lasted a approximately one hour. She tried to go back to bed and woke up at 4 AM and dizziness recurred. She denies any unusual food intake.   Review of Systems    negative for visual changes,  negative for focal weakness Past Medical History  Diagnosis Date  . Obesity   . Hypertension   . Chronic constipation   . Hemorrhoids   . ACE-inhibitor cough     History   Social History  . Marital Status: Married    Spouse Name: N/A    Number of Children: N/A  . Years of Education: N/A   Occupational History  . Not on file.   Social History Main Topics  . Smoking status: Never Smoker   . Smokeless tobacco: Not on file  . Alcohol Use: No  . Drug Use: No  . Sexually Active: Not on file   Other Topics Concern  . Not on file   Social History Narrative  . No narrative on file    Past Surgical History  Procedure Date  . Left knee replacement 03/2008  . Cesarean section     x 3     Family History  Problem Relation Age of Onset  . Hypertension Mother   . Stroke Mother   . Kidney failure Mother   . Aneurysm Mother     brain   . Alcohol abuse Father   . Hypertension Father   . Kidney disease Maternal Grandmother   . Kidney disease Maternal Grandfather   . Kidney disease Paternal Grandmother   . Kidney disease Paternal Grandfather      Allergies  Allergen Reactions  . Benazepril Hcl     REACTION: cough    No current outpatient prescriptions on file prior to visit.    BP 170/80  Pulse 84  Temp(Src) 98.1 F (36.7 C) (Oral)  Ht 5\' 7"  (1.702 m)  Wt 244 lb (110.678 kg)  BMI 38.22 kg/m2    Objective:   Physical Exam   Constitutional: Appears well-developed and well-nourished. No distress.  Head: Normocephalic and atraumatic.  Right Ear: External ear normal.  Left Ear: External ear normal.  Mouth/Throat: Oropharynx is clear and moist.  Eyes: Conjunctivae are normal. Right pupil is mildly dilated at 4-5 mm but reactive,  EOMI,  no peripheral vision deficit Neck: Normal range of motion. Neck supple. No thyromegaly present. No carotid bruit Cardiovascular: Normal rate, regular rhythm and normal heart sounds.  Exam reveals no gallop and no friction rub.   No murmur heard. Pulmonary/Chest: Effort normal and breath sounds normal.  No wheezes. No rales.  Abdominal: Soft. Bowel sounds are normal. No mass. There is no tenderness.  Neurological: Alert. No cranial nerve deficit.  Skin: Skin is warm and dry.  Psychiatric: Normal mood and affect. Behavior is normal.     Assessment & Plan:

## 2011-07-27 NOTE — Patient Instructions (Signed)
If your headache and dizziness gets worse, go to the emergency room

## 2011-07-28 ENCOUNTER — Telehealth: Payer: Self-pay | Admitting: Internal Medicine

## 2011-07-28 NOTE — Telephone Encounter (Signed)
Pt was given normal CT results per Dr Artist Pais (verbal), still feels light headed and dizzy and looses her balance.  Wants to know what Dr Artist Pais thinks it could be since CT normal

## 2011-07-28 NOTE — Telephone Encounter (Signed)
Would like her ct scan results asap. Thanks.

## 2011-07-28 NOTE — Telephone Encounter (Signed)
Please call pt her CT results ASAP.Lisa Mcconnell She is feeling dizzy today.

## 2011-07-28 NOTE — Telephone Encounter (Signed)
Her symptoms may be from resolving gastroenteritis or inner ear problem.  Please call in meclizine 12.5 mg  # 30 one po tid prn dizziness.  I suggest OV within 2 weeks if she is still dizzy.

## 2011-07-29 ENCOUNTER — Telehealth: Payer: Self-pay | Admitting: Internal Medicine

## 2011-07-29 ENCOUNTER — Telehealth: Payer: Self-pay | Admitting: *Deleted

## 2011-07-29 MED ORDER — ONDANSETRON HCL 4 MG PO TABS: 4.0000 mg | ORAL_TABLET | Freq: Three times a day (TID) | ORAL | Status: DC | PRN

## 2011-07-29 MED ORDER — POTASSIUM CHLORIDE ER 10 MEQ PO TBCR: 10.0000 meq | EXTENDED_RELEASE_TABLET | Freq: Two times a day (BID) | ORAL | Status: DC

## 2011-07-29 MED ORDER — VALSARTAN-HYDROCHLOROTHIAZIDE 160-12.5 MG PO TABS: 1.0000 | ORAL_TABLET | Freq: Every day | ORAL | Status: DC

## 2011-07-29 MED ORDER — ONDANSETRON HCL 4 MG PO TABS: 4.0000 mg | ORAL_TABLET | Freq: Three times a day (TID) | ORAL | Status: AC | PRN

## 2011-07-29 MED ORDER — AMLODIPINE BESYLATE 5 MG PO TABS: 5.0000 mg | ORAL_TABLET | Freq: Every day | ORAL | Status: DC

## 2011-07-29 MED ORDER — MECLIZINE HCL 12.5 MG PO TABS: 12.5000 mg | ORAL_TABLET | Freq: Three times a day (TID) | ORAL | Status: AC | PRN

## 2011-07-29 NOTE — Telephone Encounter (Signed)
Attempted to call pt x 4 on all her contact numbers w/o success. Nursing staff who will work [facility] advised to call pt in AM with instruction and update on status

## 2011-07-29 NOTE — Telephone Encounter (Signed)
Sent rx in electronically

## 2011-07-29 NOTE — Telephone Encounter (Signed)
Called and spoke with pt and she is aware that amlodipine will be called in to pharmacy.  Pt states her head is still swimming to the point where she has to get somewhere and sit down.  Pt states her head was swimming so much she did not go to school because she cannot drive.  Pt would like to know what the next step is.

## 2011-07-29 NOTE — Telephone Encounter (Signed)
Has pt tried taking meclizine.  If pt still feeling dizzy, I suggest pt get seen at [facility].  She can be assessed again to see if there is need of MRI of brain.

## 2011-07-29 NOTE — Telephone Encounter (Signed)
Refill-diovan hct 160-12.5mg  tab. Take one tablet daily. Qty 34 tab. Last fill 9.13.11

## 2011-07-29 NOTE — Telephone Encounter (Signed)
rx sent in electronically, pt aware 

## 2011-07-29 NOTE — Telephone Encounter (Signed)
Pt. States she is still having dizziness, and was supposed to pick up meds at [facility].  Husband went there, but no meds had been called in?  Please call her to let her know what to do.  Is also concerned about her potassium, and when to see Dr. Artist Pais again.

## 2011-07-29 NOTE — Telephone Encounter (Signed)
If pt's BP still elevated - I suggest we add amlodipine.  plz call in amlodipine 5 mg one po qd. # 30 RF x1. Pt needs OV within 2-4 weeks.

## 2011-07-29 NOTE — Telephone Encounter (Signed)
Pt aware, rx sent in electonically.  She is also having BP readings of 180/80 yesterday and 165/73 today.

## 2011-07-30 NOTE — Telephone Encounter (Signed)
Called and spoke with pt and she stated she was doing "great". Pt states she has started the norvasc.

## 2011-08-11 ENCOUNTER — Telehealth: Payer: Self-pay | Admitting: Internal Medicine

## 2011-08-11 NOTE — Telephone Encounter (Signed)
Pt called and is needing to get a copy of tb test results faxed to her job. The fax # is [phone] [facility]. If pt needs to sign a release before this is faxed, pls let her know.

## 2011-08-12 NOTE — Telephone Encounter (Signed)
Pt will need to sign medical release form.  L/M on pts cell phone

## 2011-08-17 LAB — COMPREHENSIVE METABOLIC PANEL
ALT: 15
AST: 22
Albumin: 3.8
Alkaline Phosphatase: 58
Calcium: 9.3
GFR calc Af Amer: >60
Glucose, Bld: 138 — ABNORMAL HIGH
Potassium: 3.6
Sodium: 136
Total Protein: 7.7

## 2011-08-17 LAB — URINE MICROSCOPIC-ADD ON

## 2011-08-17 LAB — CBC
Hemoglobin: 12.8
MCHC: 34.2
RDW: 12.7

## 2011-08-17 LAB — DIFFERENTIAL
Basophils Relative: 0
Eosinophils Absolute: 0
Eosinophils Relative: 0
Lymphs Abs: 0.7
Monocytes Absolute: 0.1 — ABNORMAL LOW
Monocytes Relative: 2 — ABNORMAL LOW
Neutrophils Relative %: 89 — ABNORMAL HIGH

## 2011-08-17 LAB — URINALYSIS, ROUTINE W REFLEX MICROSCOPIC
Bilirubin Urine: NEGATIVE
Glucose, UA: NEGATIVE
Ketones, ur: 40 — AB
Leukocytes, UA: NEGATIVE
Nitrite: NEGATIVE
Protein, ur: NEGATIVE
Specific Gravity, Urine: 1.021
Urobilinogen, UA: 0.2
pH: 6

## 2011-08-22 ENCOUNTER — Ambulatory Visit: Payer: 59 | Admitting: *Deleted

## 2011-09-02 ENCOUNTER — Telehealth: Payer: Self-pay | Admitting: Internal Medicine

## 2011-09-02 NOTE — Telephone Encounter (Signed)
Pt called and is req samples Diovan HCT 12.5 mg per tab. Pt is out of med.

## 2011-09-02 NOTE — Telephone Encounter (Signed)
Samples upfront, pt aware 

## 2011-09-05 ENCOUNTER — Ambulatory Visit: Payer: 59 | Admitting: Internal Medicine

## 2011-10-03 ENCOUNTER — Encounter: Payer: Self-pay | Admitting: Internal Medicine

## 2011-10-03 ENCOUNTER — Ambulatory Visit (INDEPENDENT_AMBULATORY_CARE_PROVIDER_SITE_OTHER): Payer: 59 | Admitting: Internal Medicine

## 2011-10-03 DIAGNOSIS — I10 Essential (primary) hypertension: Secondary | ICD-10-CM

## 2011-10-03 DIAGNOSIS — E876 Hypokalemia: Secondary | ICD-10-CM

## 2011-10-03 LAB — BASIC METABOLIC PANEL
BUN: 15 mg/dL (ref 6–23)
CO2: 26 mEq/L (ref 19–32)
Calcium: 9.4 mg/dL (ref 8.4–10.5)
Creatinine, Ser: 0.9 mg/dL (ref 0.4–1.2)
GFR: 80.53 mL/min (ref >60.00)
Glucose, Bld: 89 mg/dL (ref 70–99)
Sodium: 139 mEq/L (ref 135–145)

## 2011-10-03 MED ORDER — VALSARTAN-HYDROCHLOROTHIAZIDE 160-12.5 MG PO TABS: 1.0000 | ORAL_TABLET | Freq: Every day | ORAL | Status: DC

## 2011-10-03 MED ORDER — POTASSIUM CHLORIDE ER 10 MEQ PO TBCR: 10.0000 meq | EXTENDED_RELEASE_TABLET | Freq: Two times a day (BID) | ORAL | Status: DC

## 2011-10-03 NOTE — Assessment & Plan Note (Signed)
Patient encouraged to take a potassium supplement daily. We also reviewed foods that are high in potassium. Repeat basic metabolic panel today.

## 2011-10-03 NOTE — Progress Notes (Signed)
  Subjective:    Patient ID: Lisa Mcconnell, female    DOB: December 21, 1955, 55 y.o.   MRN: [id number]  HPI  55 year old [place] American female previously seen for her malignant hypertension in for followup. Patient has been much more compliant with her blood pressure medications. Her blood pressure has completely normalized. She has been taking Diovan hydrochlorothiazide. She has not been taking amlodipine.  CT of Brain reviewed.  Review of Systems Negative for headache or chest pain Past Medical History  Diagnosis Date  . Obesity   . Hypertension   . Chronic constipation   . Hemorrhoids   . ACE-inhibitor cough     History   Social History  . Marital Status: Married    Spouse Name: N/A    Number of Children: N/A  . Years of Education: N/A   Occupational History  . Not on file.   Social History Main Topics  . Smoking status: Never Smoker   . Smokeless tobacco: Not on file  . Alcohol Use: No  . Drug Use: No  . Sexually Active: Not on file   Other Topics Concern  . Not on file   Social History Narrative  . No narrative on file    Past Surgical History  Procedure Date  . Left knee replacement 03/2008  . Cesarean section     x 3     Family History  Problem Relation Age of Onset  . Hypertension Mother   . Stroke Mother   . Kidney failure Mother   . Aneurysm Mother     brain   . Alcohol abuse Father   . Hypertension Father   . Kidney disease Maternal Grandmother   . Kidney disease Maternal Grandfather   . Kidney disease Paternal Grandmother   . Kidney disease Paternal Grandfather     Allergies  Allergen Reactions  . Benazepril Hcl     REACTION: cough    No current outpatient prescriptions on file prior to visit.    BP 122/74  Pulse 68  Temp(Src) 98.1 F (36.7 C) (Oral)  Wt 247 lb (112.038 kg)    Objective:   Physical Exam   Constitutional: Appears well-developed and well-nourished. No distress.  Cardiovascular: Normal rate, regular rhythm  and normal heart sounds.  Exam reveals no gallop and no friction rub.  No murmur heard. Pulmonary/Chest: Effort normal and breath sounds normal.  No wheezes. No rales.  Neurological: Alert. No cranial nerve deficit.  Skin: Skin is warm and dry. trace lower extremity edema Psychiatric: Normal mood and affect. Behavior is normal.      Assessment & Plan:

## 2011-10-03 NOTE — Assessment & Plan Note (Signed)
Patient's CT of the brain was negative for acute lesion. Her blood pressure is significantly improved with reinitiating Diovan/hydrochlorothiazide. She has not been taking her amlodipine. BP: 122/74 mmHg  Lab Results  Component Value Date   CREATININE 0.9 10/03/2011

## 2011-10-19 ENCOUNTER — Telehealth: Payer: Self-pay | Admitting: Internal Medicine

## 2011-10-19 NOTE — Telephone Encounter (Signed)
Pt is out of potassium do you want her to continue it?

## 2011-10-19 NOTE — Telephone Encounter (Signed)
Requesting lab results that were done 2 weeks ago. Thanks.      Or you can call her at [phone].

## 2011-10-19 NOTE — Telephone Encounter (Signed)
Yes,  Ok to refill # 90 x 3

## 2011-10-19 NOTE — Telephone Encounter (Signed)
Call pt - electrolytes and kidney function is normal

## 2011-10-20 NOTE — Telephone Encounter (Signed)
LMTCB

## 2012-02-20 ENCOUNTER — Other Ambulatory Visit: Payer: Self-pay | Admitting: *Deleted

## 2012-02-20 MED ORDER — POTASSIUM CHLORIDE ER 10 MEQ PO TBCR: 10.0000 meq | EXTENDED_RELEASE_TABLET | Freq: Two times a day (BID) | ORAL | Status: DC

## 2012-03-16 ENCOUNTER — Telehealth: Payer: Self-pay | Admitting: Internal Medicine

## 2012-03-16 DIAGNOSIS — I10 Essential (primary) hypertension: Secondary | ICD-10-CM

## 2012-03-16 NOTE — Telephone Encounter (Signed)
Patient called stating that the HCTZ is $118 and it is too expensive and she would like to know if she could possibly go back to taking the amlodipine as she can afford that. Please assist.

## 2012-03-16 NOTE — Telephone Encounter (Signed)
She is on diovan hctz.  If too expensive change to losartan 100 mg and separate rx for hctz 12.5 mg.  # 30 RF X 3.  Pt should monitor BP and call office if significant change.

## 2012-03-16 NOTE — Telephone Encounter (Signed)
Left message for pt to call back  °

## 2012-03-20 MED ORDER — LOSARTAN POTASSIUM 100 MG PO TABS: 100.0000 mg | ORAL_TABLET | Freq: Every day | ORAL | Status: DC

## 2012-03-20 MED ORDER — HYDROCHLOROTHIAZIDE 12.5 MG PO TABS: 12.5000 mg | ORAL_TABLET | Freq: Every day | ORAL | Status: DC

## 2012-03-20 NOTE — Telephone Encounter (Signed)
Addended by: Alfred Levins D on: 03/20/2012 10:48 AM   Modules accepted: Orders

## 2012-03-20 NOTE — Telephone Encounter (Signed)
rx's sent in electronically, but pt wants to know if you could put her back on amlodipine

## 2012-03-21 NOTE — Telephone Encounter (Signed)
Patient is aware and agrees. 

## 2012-03-21 NOTE — Telephone Encounter (Signed)
Lets see how see does on losartan and hctz.  If BP is not controlled, we can add amlodipine.

## 2012-03-21 NOTE — Telephone Encounter (Signed)
Left message on machine for patient to return our call 

## 2012-03-22 ENCOUNTER — Telehealth: Payer: Self-pay | Admitting: Internal Medicine

## 2012-03-22 NOTE — Telephone Encounter (Signed)
Left message on home # to return my call. 

## 2012-03-22 NOTE — Telephone Encounter (Signed)
Pt called and said that the med that was called in is too expensive. Pt said to please call in Norvasc because it is cheaper. Pls call in to [facility].

## 2012-05-01 NOTE — Telephone Encounter (Signed)
Talked with pharmacist and pt now has insurance with 5 dollar co pay- she bought it

## 2012-05-01 NOTE — Telephone Encounter (Signed)
Lisa Mcconnell, please call patient's pharmacy and clarify which medication is too expensive

## 2012-07-27 ENCOUNTER — Other Ambulatory Visit: Payer: Self-pay | Admitting: Internal Medicine

## 2012-09-03 ENCOUNTER — Other Ambulatory Visit: Payer: Self-pay | Admitting: Internal Medicine

## 2012-10-07 ENCOUNTER — Other Ambulatory Visit: Payer: Self-pay | Admitting: Internal Medicine

## 2012-11-20 ENCOUNTER — Other Ambulatory Visit: Payer: Self-pay | Admitting: Internal Medicine

## 2012-11-20 NOTE — Telephone Encounter (Signed)
Ok for rx for [employer] 8 mcg # 60 one po bid prn constipation  RF x 3

## 2012-11-20 NOTE — Telephone Encounter (Signed)
Pt would like a script for AMITIZA.the patient states. Pt is constipated, and says it has been over a month that she has had a BM.

## 2012-11-21 MED ORDER — LUBIPROSTONE 8 MCG PO CAPS: 8.0000 ug | ORAL_CAPSULE | Freq: Two times a day (BID) | ORAL | Status: DC

## 2012-11-21 NOTE — Telephone Encounter (Signed)
rx sent in electronically to [facility], pt aware

## 2013-02-20 ENCOUNTER — Other Ambulatory Visit: Payer: Self-pay | Admitting: Internal Medicine

## 2013-02-25 ENCOUNTER — Telehealth: Payer: Self-pay | Admitting: *Deleted

## 2013-02-25 ENCOUNTER — Encounter: Payer: Self-pay | Admitting: *Deleted

## 2013-02-25 ENCOUNTER — Telehealth: Payer: Self-pay | Admitting: Internal Medicine

## 2013-02-25 MED ORDER — HYDROCHLOROTHIAZIDE 12.5 MG PO CAPS: ORAL_CAPSULE | ORAL | Status: DC

## 2013-02-25 MED ORDER — LOSARTAN POTASSIUM 100 MG PO TABS: ORAL_TABLET | ORAL | Status: DC

## 2013-02-25 NOTE — Telephone Encounter (Signed)
See 02/25/13 phone note.  [facility] Triage Call Report Triage Record Num: [id number] Operator: Cornell Barman Patient Name: Lisa Mcconnell Call Date & Time: 02/23/2013 8:12:23AM Patient Phone: [phone] PCP: Thomos Lemons Patient Gender: Female PCP Fax : [phone] Patient DOB: [birth year] Practice Name: [facility] [place] [facility] Reason for Call: Caller: Lisa Mcconnell/Patient; PCP: Other; CB#: [phone]; Call regarding Out of her blood pressure medication; Patient states she has been without her B/p med since Tuesday 02/19/13. She contacted her pharmacy and they sent a request to the office . She didi not find out until yesterday that Dr. Artist Pais will not fill without seeing her. She is out and needs medication until appt. Reviewed EPIC patient has several medication listed Cozaar, and two different HCTZ . She cannot tell me the names of her medication she threw bottles away. Attemtped to [facility] [phone] for Clarification and they are closed for Holiday. Offered her an appt for today at [facility] and she declined , she has to work. She found bottles in Trash. confirmed medication bottles and dosage . Cozaar 100mg  take one tablet daily and HCTZ Hydrodiuril 12.5mg  take one tablet daily, Give #3 given only with no refills and contact the office on Monday for assistance. She uses [facility] at [address] [phone]. Left information on automated line. Caller aware pharmacy opens at 09:00 and understands that she must contact the office on Monday. Medication, allergy and history reviewed per EPIC Protocol(s) Used: Office Note Recommended Outcome per Protocol: Information Noted and Sent to Office Reason for Outcome: Caller information to office

## 2013-02-25 NOTE — Telephone Encounter (Signed)
rx sent in electronically 

## 2013-02-25 NOTE — Telephone Encounter (Signed)
Patient called stating that she need a refill of her hydrochlorothiazide 12.5 mg 1poqd and her losartan 100 mg 1poqd sent to [facility] to last until her appt as she is completely out. Please assist.

## 2013-02-28 ENCOUNTER — Ambulatory Visit: Payer: 59 | Admitting: Internal Medicine

## 2013-03-06 ENCOUNTER — Ambulatory Visit (INDEPENDENT_AMBULATORY_CARE_PROVIDER_SITE_OTHER): Payer: 59 | Admitting: Internal Medicine

## 2013-03-06 ENCOUNTER — Encounter: Payer: Self-pay | Admitting: Internal Medicine

## 2013-03-06 ENCOUNTER — Ambulatory Visit: Payer: 59 | Admitting: Internal Medicine

## 2013-03-06 VITALS — BP 160/80 | HR 68 | Temp 97.8°F | Ht 67.0 in | Wt 251.0 lb

## 2013-03-06 DIAGNOSIS — I10 Essential (primary) hypertension: Secondary | ICD-10-CM

## 2013-03-06 LAB — BASIC METABOLIC PANEL
BUN: 18 mg/dL (ref 6–23)
Calcium: 9.2 mg/dL (ref 8.4–10.5)
Chloride: 105 mEq/L (ref 96–112)
Creatinine, Ser: 0.7 mg/dL (ref 0.4–1.2)

## 2013-03-06 MED ORDER — LOSARTAN POTASSIUM-HCTZ 100-12.5 MG PO TABS: 1.0000 | ORAL_TABLET | Freq: Every day | ORAL | Status: DC

## 2013-03-06 MED ORDER — AMLODIPINE BESYLATE 5 MG PO TABS: 5.0000 mg | ORAL_TABLET | Freq: Every day | ORAL | Status: DC

## 2013-03-06 NOTE — Assessment & Plan Note (Signed)
Blood pressure is suboptimally controlled. Changed to Hyzaar 100/12.5 mg once daily. Add amlodipine 5 mg. Monitor electrolytes and kidney function. BP: 160/80 mmHg

## 2013-03-06 NOTE — Patient Instructions (Addendum)
If you experience dizziness or lightheadedness, take 1/2 tablet of amlodipine 5 mg.

## 2013-03-06 NOTE — Progress Notes (Signed)
  Subjective:    Patient ID: Lisa Mcconnell, female    DOB: 13-Oct-1956, 57 y.o.   MRN: [id number]  HPI  56 year old [place] American female for followup regarding hypertension. Patient currently taking losartan 50 mg and hydrochlorothiazide 12.5 mg. Patient reports monitoring her blood pressure at home and home blood pressure readings are suboptimal. Her systolic blood pressure readings are usually in the mid-140s.   Review of Systems Negative for chest pain or shortness of breath  Past Medical History  Diagnosis Date  . Obesity   . Hypertension   . Chronic constipation   . Hemorrhoids   . ACE-inhibitor cough     History   Social History  . Marital Status: Married    Spouse Name: N/A    Number of Children: N/A  . Years of Education: N/A   Occupational History  . Not on file.   Social History Main Topics  . Smoking status: Never Smoker   . Smokeless tobacco: Not on file  . Alcohol Use: No  . Drug Use: No  . Sexually Active: Not on file   Other Topics Concern  . Not on file   Social History Narrative  . No narrative on file    Past Surgical History  Procedure Laterality Date  . Left knee replacement  03/2008  . Cesarean section      x 3     Family History  Problem Relation Age of Onset  . Hypertension Mother   . Stroke Mother   . Kidney failure Mother   . Aneurysm Mother     brain   . Alcohol abuse Father   . Hypertension Father   . Kidney disease Maternal Grandmother   . Kidney disease Maternal Grandfather   . Kidney disease Paternal Grandmother   . Kidney disease Paternal Grandfather     Allergies  Allergen Reactions  . Benazepril Hcl     REACTION: cough    Current Outpatient Prescriptions on File Prior to Visit  Medication Sig Dispense Refill  . lubiprostone (AMITIZA) 8 MCG capsule Take 1 capsule (8 mcg total) by mouth 2 (two) times daily with a meal.  60 capsule  3  . potassium chloride (K-DUR) 10 MEQ tablet Take 1 tablet (10 mEq total) by  mouth 2 (two) times daily.  60 tablet  3   No current facility-administered medications on file prior to visit.    BP 160/80  Pulse 68  Temp(Src) 97.8 F (36.6 C) (Oral)  Ht 5\' 7"  (1.702 m)  Wt 251 lb (113.853 kg)  BMI 39.3 kg/m2       Objective:   Physical Exam  Constitutional: She appears well-developed and well-nourished.  HENT:  Head: Normocephalic and atraumatic.  Cardiovascular: Normal rate, regular rhythm and normal heart sounds.   Pulmonary/Chest: Effort normal. She has no wheezes.  Musculoskeletal: She exhibits no edema.  Skin: Skin is warm and dry.  Psychiatric: She has a normal mood and affect. Her behavior is normal.          Assessment & Plan:

## 2013-03-08 ENCOUNTER — Telehealth: Payer: Self-pay | Admitting: Internal Medicine

## 2013-03-08 NOTE — Telephone Encounter (Signed)
Pt calling today 03/08/13 regarding returning call from office staff regarding recent lab results.  Triage pulled chart and advised pt of following message from Dr. Artist Pais, "Call patient-electrolytes and kidney function stable/normal.".  Pt verbalized understanding.

## 2013-03-08 NOTE — Telephone Encounter (Signed)
Noted  

## 2013-03-11 ENCOUNTER — Encounter: Payer: Self-pay | Admitting: *Deleted

## 2013-03-11 ENCOUNTER — Ambulatory Visit: Payer: 59 | Admitting: Internal Medicine

## 2013-10-16 ENCOUNTER — Other Ambulatory Visit: Payer: Self-pay | Admitting: Internal Medicine

## 2014-04-09 ENCOUNTER — Other Ambulatory Visit: Payer: Self-pay | Admitting: Internal Medicine

## 2014-08-18 ENCOUNTER — Telehealth: Payer: Self-pay | Admitting: Internal Medicine

## 2014-08-18 MED ORDER — LUBIPROSTONE 8 MCG PO CAPS: 8.0000 ug | ORAL_CAPSULE | Freq: Two times a day (BID) | ORAL | Status: DC

## 2014-08-18 NOTE — Telephone Encounter (Signed)
Pt has appt on 09/12/14.  1 month supply sent in electronically to [facility]

## 2014-08-18 NOTE — Telephone Encounter (Signed)
Pt needs new rx amitiza call into [facility]

## 2014-09-12 ENCOUNTER — Encounter: Payer: Self-pay | Admitting: Internal Medicine

## 2014-09-12 ENCOUNTER — Ambulatory Visit (INDEPENDENT_AMBULATORY_CARE_PROVIDER_SITE_OTHER): Payer: 59 | Admitting: Internal Medicine

## 2014-09-12 VITALS — BP 142/72 | HR 76 | Temp 98.0°F | Ht 67.0 in | Wt 252.0 lb

## 2014-09-12 DIAGNOSIS — K5909 Other constipation: Secondary | ICD-10-CM

## 2014-09-12 DIAGNOSIS — I10 Essential (primary) hypertension: Secondary | ICD-10-CM

## 2014-09-12 MED ORDER — LACTULOSE 10 G PO PACK: 10.0000 g | PACK | Freq: Every day | ORAL | Status: DC | PRN

## 2014-09-12 MED ORDER — LACTULOSE 10 GM/15ML PO SOLN
10.0000 g | Freq: Every day | ORAL | Status: DC | PRN
Start: 2014-09-12 — End: 2015-10-05

## 2014-09-12 MED ORDER — LOSARTAN POTASSIUM-HCTZ 100-12.5 MG PO TABS: 1.0000 | ORAL_TABLET | Freq: Every day | ORAL | Status: DC

## 2014-09-12 MED ORDER — AMLODIPINE BESYLATE 5 MG PO TABS: 5.0000 mg | ORAL_TABLET | Freq: Every day | ORAL | Status: DC

## 2014-09-12 NOTE — Assessment & Plan Note (Signed)
BP improved.  Continue same medication regimen. Monitor electrolytes and kidney function. BP: (!) 142/72 mmHg

## 2014-09-12 NOTE — Progress Notes (Signed)
Pre visit review using our clinic review tool, if applicable. No additional management support is needed unless otherwise documented below in the visit note. 

## 2014-09-12 NOTE — Progress Notes (Signed)
   Subjective:    Patient ID: Lisa Mcconnell, female    DOB: [birth year], 58 y.o.   MRN: [id number]  HPI  58 year old African-American female with history of hypertension and idiopathic constipation for routine follow-up. Patient denies any significant interval medical history. Patient reports taking her antihypertensives on a regular basis. She denies any side effects.  Chronic constipation-patient had to discontinue, Amitiza due to cost reasons. She still suffers from chronic constipation.  Review of Systems Negative for chest pain,  Her last colonoscopy was performed in November 2009. It was unremarkable other than internal hemorrhoids.    Past Medical History  Diagnosis Date  . Obesity   . Hypertension   . Chronic constipation   . Hemorrhoids   . ACE-inhibitor cough     History   Social History  . Marital Status: Married    Spouse Name: N/A    Number of Children: N/A  . Years of Education: N/A   Occupational History  . Not on file.   Social History Main Topics  . Smoking status: Never Smoker   . Smokeless tobacco: Not on file  . Alcohol Use: No  . Drug Use: No  . Sexual Activity: Not on file   Other Topics Concern  . Not on file   Social History Narrative    Past Surgical History  Procedure Laterality Date  . Left knee replacement  03/2008  . Cesarean section      x 3     Family History  Problem Relation Age of Onset  . Hypertension Mother   . Stroke Mother   . Kidney failure Mother   . Aneurysm Mother     brain   . Alcohol abuse Father   . Hypertension Father   . Kidney disease Maternal Grandmother   . Kidney disease Maternal Grandfather   . Kidney disease Paternal Grandmother   . Kidney disease Paternal Grandfather     Allergies  Allergen Reactions  . Benazepril Hcl     REACTION: cough    No current outpatient prescriptions on file prior to visit.   No current facility-administered medications on file prior to visit.    BP 142/72 mmHg   Pulse 76  Temp(Src) 98 F (36.7 C) (Oral)  Ht 5\' 7"  (1.702 m)  Wt 252 lb (114.306 kg)  BMI 39.46 kg/m2    Objective:   Physical Exam  Constitutional: She is oriented to person, place, and time. She appears well-developed and well-nourished. No distress.  Cardiovascular: Normal rate, regular rhythm and normal heart sounds.   No murmur heard. Pulmonary/Chest: Effort normal and breath sounds normal. She has no wheezes.  Neurological: She is alert and oriented to person, place, and time. No cranial nerve deficit.          Assessment & Plan:

## 2014-09-12 NOTE — Patient Instructions (Signed)
Please complete the following lab tests before your next follow up appointment: BMET - 401.9 

## 2014-09-12 NOTE — Assessment & Plan Note (Signed)
Patient has history of chronic functional constipation.  Amitiza cost prohibitive.  Patient reports over-the-counter MiraLAX ineffective. Trial of lactulose.

## 2014-09-13 LAB — HEPATIC FUNCTION PANEL
ALBUMIN: 3.8 g/dL (ref 3.5–5.2)
ALK PHOS: 58 U/L (ref 39–117)
ALT: 18 U/L (ref 0–35)
AST: 29 U/L (ref 0–37)
BILIRUBIN TOTAL: 0.5 mg/dL (ref 0.2–1.2)
Bilirubin, Direct: 0.1 mg/dL (ref 0.0–0.3)
Indirect Bilirubin: 0.4 mg/dL (ref 0.2–1.2)
Total Protein: 6.8 g/dL (ref 6.0–8.3)

## 2014-09-13 LAB — LIPID PANEL
Cholesterol: 174 mg/dL (ref 0–200)
HDL: 65 mg/dL (ref >39)
LDL CALC: 97 mg/dL (ref 0–99)
Total CHOL/HDL Ratio: 2.7 Ratio
Triglycerides: 61 mg/dL (ref <150)
VLDL: 12 mg/dL (ref 0–40)

## 2014-09-13 LAB — BASIC METABOLIC PANEL
BUN: 12 mg/dL (ref 6–23)
CHLORIDE: 103 meq/L (ref 96–112)
CO2: 25 meq/L (ref 19–32)
CREATININE: 0.65 mg/dL (ref 0.50–1.10)
Calcium: 9.1 mg/dL (ref 8.4–10.5)
Glucose, Bld: 80 mg/dL (ref 70–99)
Potassium: 3.7 mEq/L (ref 3.5–5.3)
Sodium: 141 mEq/L (ref 135–145)

## 2014-09-16 ENCOUNTER — Encounter: Payer: Self-pay | Admitting: *Deleted

## 2014-12-09 ENCOUNTER — Ambulatory Visit (INDEPENDENT_AMBULATORY_CARE_PROVIDER_SITE_OTHER): Payer: 59 | Admitting: Internal Medicine

## 2014-12-09 ENCOUNTER — Telehealth: Payer: Self-pay

## 2014-12-09 ENCOUNTER — Encounter: Payer: Self-pay | Admitting: Internal Medicine

## 2014-12-09 VITALS — BP 172/72 | HR 107 | Temp 98.6°F | Ht 67.0 in | Wt 261.0 lb

## 2014-12-09 DIAGNOSIS — I10 Essential (primary) hypertension: Secondary | ICD-10-CM

## 2014-12-09 DIAGNOSIS — J019 Acute sinusitis, unspecified: Secondary | ICD-10-CM

## 2014-12-09 MED ORDER — LEVOFLOXACIN 500 MG PO TABS: 500.0000 mg | ORAL_TABLET | Freq: Every day | ORAL | Status: DC

## 2014-12-09 NOTE — Assessment & Plan Note (Signed)
Uncontrolled, states better controlled at home, declines med change, to re-start meds

## 2014-12-09 NOTE — Assessment & Plan Note (Signed)
Mild to mod, for antibx course,  to f/u any worsening symptoms or concerns 

## 2014-12-09 NOTE — Progress Notes (Signed)
   Subjective:    Patient ID: Lisa Mcconnell, female    DOB: June 24, 1956, 59 y.o.   MRN: [id number]  HPI   Here with 2-3 days acute onset fever, facial pain, pressure, headache, general weakness and malaise, and greenish d/c, with mild ST and cough, but pt denies chest pain, wheezing, increased sob or doe, orthopnea, PND, increased LE swelling, palpitations, dizziness or syncope.  Has not taken BP meds today Past Medical History  Diagnosis Date  . Obesity   . Hypertension   . Chronic constipation   . Hemorrhoids   . ACE-inhibitor cough    Past Surgical History  Procedure Laterality Date  . Left knee replacement  03/2008  . Cesarean section      x 3     reports that she has never smoked. She does not have any smokeless tobacco history on file. She reports that she does not drink alcohol or use illicit drugs. family history includes Alcohol abuse in her father; Aneurysm in her mother; Hypertension in her father and mother; Kidney disease in her maternal grandfather, maternal grandmother, paternal grandfather, and paternal grandmother; Kidney failure in her mother; Stroke in her mother. Allergies  Allergen Reactions  . Benazepril Hcl     REACTION: cough   Current Outpatient Prescriptions on File Prior to Visit  Medication Sig Dispense Refill  . amLODipine (NORVASC) 5 MG tablet Take 1 tablet (5 mg total) by mouth daily. 90 tablet 1  . lactulose (CHRONULAC) 10 GM/15ML solution Take 15 mLs (10 g total) by mouth daily as needed for mild constipation. 473 mL 3  . losartan-hydrochlorothiazide (HYZAAR) 100-12.5 MG per tablet Take 1 tablet by mouth daily. 90 tablet 1   No current facility-administered medications on file prior to visit.    Review of Systems All otherwise neg per pt     Objective:   Physical Exam BP 172/72 mmHg  Pulse 107  Temp(Src) 98.6 F (37 C) (Oral)  Ht 5\' 7"  (1.702 m)  Wt 261 lb (118.389 kg)  BMI 40.87 kg/m2  SpO2 96% VS noted, mild ill Constitutional: Pt  appears well-developed, well-nourished.  HENT: Head: NCAT.  Right Ear: External ear normal.  Left Ear: External ear normal.  Eyes: . Pupils are equal, round, and reactive to light. Conjunctivae and EOM are normal Neck: Normal range of motion. Neck supple.  Cardiovascular: Normal rate and regular rhythm.   Bilat tm's with mild erythema.  Max sinus areas mild tender.  Pharynx with mild erythema, no exudate Pulmonary/Chest: Effort normal and breath sounds without rales or wheezing.  Neurological: Pt is alert. Not confused , motor grossly intact Skin: Skin is warm. No rash Psychiatric: Pt behavior is normal. No agitation.      Assessment & Plan:

## 2014-12-09 NOTE — Patient Instructions (Signed)
Please take all new medication as prescribed - the antibiotic  Please continue all other medications as before, and refills have been done if requested.  Please have the pharmacy call with any other refills you may need.  Please keep your appointments with your specialists as you may have planned   

## 2014-12-09 NOTE — Progress Notes (Signed)
Pre visit review using our clinic review tool, if applicable. No additional management support is needed unless otherwise documented below in the visit note. 

## 2014-12-09 NOTE — Telephone Encounter (Signed)
Pt walked into office today and states she cannot breathe and has been coughing for 2 weeks.  Pt triaged and her symptoms include:  Cough, congestion, hoarseness, yellow mucus, and difficulty sleeping.  Pt denies fever, body aches, chills. Pt states she missed work on yesterday and she will not go in today.  I checked pt's oxygen and it was 95% and her pulse was 96.  Advised pt that the clinic was full for today so pt could either wait until the morning or go to UC.  Pt states she did not want to go UC and would come back in the morning.  Pt taken to the scheduling dept to make an appt.

## 2014-12-10 ENCOUNTER — Ambulatory Visit: Payer: Self-pay | Admitting: Family Medicine

## 2014-12-30 ENCOUNTER — Encounter (HOSPITAL_COMMUNITY): Payer: Self-pay

## 2014-12-30 ENCOUNTER — Emergency Department (HOSPITAL_COMMUNITY)
Admission: EM | Admit: 2014-12-30 | Discharge: 2014-12-30 | Disposition: A | Discharge status: Home / Self Care | Payer: 59 | Attending: Emergency Medicine | Admitting: Emergency Medicine

## 2014-12-30 ENCOUNTER — Encounter (HOSPITAL_COMMUNITY): Payer: Self-pay | Admitting: Emergency Medicine

## 2014-12-30 ENCOUNTER — Emergency Department (HOSPITAL_COMMUNITY): Payer: 59

## 2014-12-30 DIAGNOSIS — Z8719 Personal history of other diseases of the digestive system: Secondary | ICD-10-CM | POA: Insufficient documentation

## 2014-12-30 DIAGNOSIS — M79602 Pain in left arm: Secondary | ICD-10-CM | POA: Insufficient documentation

## 2014-12-30 DIAGNOSIS — Z792 Long term (current) use of antibiotics: Secondary | ICD-10-CM | POA: Diagnosis not present

## 2014-12-30 DIAGNOSIS — E669 Obesity, unspecified: Secondary | ICD-10-CM | POA: Insufficient documentation

## 2014-12-30 DIAGNOSIS — K5900 Constipation, unspecified: Secondary | ICD-10-CM | POA: Diagnosis not present

## 2014-12-30 DIAGNOSIS — Z79899 Other long term (current) drug therapy: Secondary | ICD-10-CM | POA: Insufficient documentation

## 2014-12-30 DIAGNOSIS — M792 Neuralgia and neuritis, unspecified: Secondary | ICD-10-CM | POA: Insufficient documentation

## 2014-12-30 DIAGNOSIS — R52 Pain, unspecified: Secondary | ICD-10-CM

## 2014-12-30 DIAGNOSIS — I10 Essential (primary) hypertension: Secondary | ICD-10-CM | POA: Diagnosis not present

## 2014-12-30 MED ORDER — OXYCODONE-ACETAMINOPHEN 5-325 MG PO TABS: 1.0000 | ORAL_TABLET | ORAL | Status: DC | PRN

## 2014-12-30 MED ORDER — CELECOXIB 100 MG PO CAPS: 100.0000 mg | ORAL_CAPSULE | Freq: Two times a day (BID) | ORAL | Status: DC

## 2014-12-30 MED ORDER — OXYCODONE-ACETAMINOPHEN 5-325 MG PO TABS
2.0000 | ORAL_TABLET | Freq: Once | ORAL | Status: AC
Start: 2014-12-30 — End: 2014-12-30
  Administered 2014-12-30: 2 via ORAL
  Filled 2014-12-30: qty 2

## 2014-12-30 MED ORDER — HYDROCODONE-ACETAMINOPHEN 5-325 MG PO TABS: 1.0000 | ORAL_TABLET | Freq: Four times a day (QID) | ORAL | Status: DC | PRN

## 2014-12-30 MED ORDER — OXYCODONE-ACETAMINOPHEN 5-325 MG PO TABS
1.0000 | ORAL_TABLET | Freq: Once | ORAL | Status: AC
  Administered 2014-12-30: 1 via ORAL
  Filled 2014-12-30: qty 1

## 2014-12-30 NOTE — ED Notes (Signed)
Patient was educated not to drive, operate heavy machinery, or drink alcohol while taking narcotic medication.  

## 2014-12-30 NOTE — ED Notes (Signed)
Patient states that she has had intermittent left arm pain and numbness x 3 months. Patient states she had her left arm unjder her when she was sleeping and is now having throbbing and the "feeling like it's asleep."

## 2014-12-30 NOTE — ED Provider Notes (Signed)
CSN: [id number]     Arrival date & time 12/30/14  0710 History   First MD Initiated Contact with Patient 12/30/14 [phone]     Chief Complaint  Patient presents with  . Arm Pain     (Consider location/radiation/quality/duration/timing/severity/associated sxs/prior Treatment) Patient is a 59 y.o. female presenting with arm pain. The history is provided by the patient (pt complaisn of left arm pain).  Arm Pain This is a new problem. The current episode started more than 1 week ago. The problem occurs daily. The problem has not changed since onset.Pertinent negatives include no chest pain, no abdominal pain and no headaches. Nothing aggravates the symptoms. Nothing relieves the symptoms.    Past Medical History  Diagnosis Date  . Obesity   . Hypertension   . Chronic constipation   . Hemorrhoids   . ACE-inhibitor cough    Past Surgical History  Procedure Laterality Date  . Left knee replacement  03/2008  . Cesarean section      x 3    Family History  Problem Relation Age of Onset  . Hypertension Mother   . Stroke Mother   . Kidney failure Mother   . Aneurysm Mother     brain   . Alcohol abuse Father   . Hypertension Father   . Kidney disease Maternal Grandmother   . Kidney disease Maternal Grandfather   . Kidney disease Paternal Grandmother   . Kidney disease Paternal Grandfather    History  Substance Use Topics  . Smoking status: Never Smoker   . Smokeless tobacco: Not on file  . Alcohol Use: No   OB History    No data available     Review of Systems  Constitutional: Negative for appetite change and fatigue.  HENT: Negative for congestion, ear discharge and sinus pressure.   Eyes: Negative for discharge.  Respiratory: Negative for cough.   Cardiovascular: Negative for chest pain.  Gastrointestinal: Negative for abdominal pain and diarrhea.  Genitourinary: Negative for frequency and hematuria.  Musculoskeletal: Negative for back pain.       Pain let arm  Skin:  Negative for rash.  Neurological: Negative for seizures and headaches.  Psychiatric/Behavioral: Negative for hallucinations.      Allergies  Benazepril hcl  Home Medications   Prior to Admission medications   Medication Sig Start Date End Date Taking? Authorizing Provider  amLODipine (NORVASC) 5 MG tablet Take 1 tablet (5 mg total) by mouth daily. 09/12/14   Doe-Hyun R Shawna Orleans, DO  celecoxib (CELEBREX) 100 MG capsule Take 1 capsule (100 mg total) by mouth 2 (two) times daily. 12/30/14   Maudry Diego, MD  HYDROcodone-acetaminophen (NORCO/VICODIN) 5-325 MG per tablet Take 1 tablet by mouth every 6 (six) hours as needed for moderate pain. 12/30/14   Maudry Diego, MD  lactulose (CHRONULAC) 10 GM/15ML solution Take 15 mLs (10 g total) by mouth daily as needed for mild constipation. 09/12/14   Doe-Hyun R Shawna Orleans, DO  levofloxacin (LEVAQUIN) 500 MG tablet Take 1 tablet (500 mg total) by mouth daily. 12/09/14   Biagio Borg, MD  losartan-hydrochlorothiazide (HYZAAR) 100-12.5 MG per tablet Take 1 tablet by mouth daily. 09/12/14   Doe-Hyun R Shawna Orleans, DO   BP 175/70 mmHg  Pulse 67  Temp(Src) 97.7 F (36.5 C) (Oral)  Resp 16  SpO2 98% Physical Exam  Constitutional: She is oriented to person, place, and time. She appears well-developed.  HENT:  Head: Normocephalic.  Eyes: Conjunctivae and EOM are normal. No scleral  icterus.  Neck: Neck supple. No thyromegaly present.  Cardiovascular: Normal rate and regular rhythm.  Exam reveals no gallop and no friction rub.   No murmur heard. Pulmonary/Chest: No stridor. She has no wheezes. She has no rales. She exhibits no tenderness.  Abdominal: She exhibits no distension. There is no tenderness. There is no rebound.  Musculoskeletal: Normal range of motion. She exhibits no edema.  Lymphadenopathy:    She has no cervical adenopathy.  Neurological: She is oriented to person, place, and time. She exhibits normal muscle tone. Coordination normal.  Skin: No rash  noted. No erythema.  Psychiatric: She has a normal mood and affect. Her behavior is normal.    ED Course  Procedures (including critical care time) Labs Review Labs Reviewed - No data to display  Imaging Review Dg Chest 2 View  12/30/2014   CLINICAL DATA:  Nonsmoker, neck pain, left arm pain for 2 months  EXAM: CHEST  2 VIEW  COMPARISON:  03/07/2008  FINDINGS: Cardiomediastinal silhouette is stable. No acute infiltrate or pleural effusion. No pulmonary edema. Mild mid thoracic dextroscoliosis.  IMPRESSION: No active cardiopulmonary disease.   Electronically Signed   By: Lahoma Crocker M.D.   On: 12/30/2014 08:24   Dg Cervical Spine Complete  12/30/2014   CLINICAL DATA:  Neck pain for 2 months, left shoulder pain  EXAM: CERVICAL SPINE  4+ VIEWS  COMPARISON:  10/17/2004  FINDINGS: Five views of cervical spine submitted. There is straightening of cervical spine. No acute fracture or subluxation. Mild degenerative changes C1-C2 articulation. Mild disc space flattening with anterior spurring at C5-C6 level. Minimal disc space flattening at C6-C7 level no prevertebral soft tissue swelling. Mild narrowing of neural foramina bilaterally at C6-C7 level. Mild narrowing of left neural foramina at C5-C6 level.  IMPRESSION: No acute fracture or subluxation. Degenerative changes as described above.   Electronically Signed   By: Lahoma Crocker M.D.   On: 12/30/2014 08:26     EKG Interpretation None      MDM   Final diagnoses:  Pain  Neuritis    Neuritis left arm.  tx with celebrex and hydrocodone.  And follow up     Maudry Diego, MD 12/30/14 [phone]

## 2014-12-30 NOTE — Discharge Instructions (Signed)
Follow up with Dr. Lorin Mercy in 1-2 weeks.

## 2014-12-30 NOTE — ED Notes (Addendum)
Patient reports to ED after leaving this morning.  Reports ongoing arm pain.  Patient states she had relief this morning after taking Percocet which was given to her here but that she asked for Percocet RX when leaving and thought that's what she had been given.  Reports that when she took this it made her sick on her stomach.  Pt reports "I just want to be out of pain".

## 2014-12-30 NOTE — ED Provider Notes (Signed)
CSN: [id number]     Arrival date & time 12/30/14  1925 History  This chart was scribed for Charlann Lange, PA-C working with Mariea Clonts, MD by Randa Evens, ED Scribe. This patient was seen in room WTR8/WTR8 and the patient's care was started at 8:12 PM.    Chief Complaint  Patient presents with  . Arm Pain   Patient is a 59 y.o. female presenting with arm pain. The history is provided by the patient. No language interpreter was used.  Arm Pain   HPI Comments: Lisa Mcconnell is a 59 y.o. female who presents to the Emergency Department complaining of throbbing left arm pain onset November 2015. Pt states that the pain radiates up into her shoulder. PT states Pt states that she was evaluated in the ED this morning and was given percocet that provided relief. Pt states that she was discharged with hydrocodone which makes her "sick on my stomach." PT states she was evaluated previously by her orthopedic and ws prescribed steroids that hasn't provided any relief. Pt doesn't report any other symptoms.   Past Medical History  Diagnosis Date  . Obesity   . Hypertension   . Chronic constipation   . Hemorrhoids   . ACE-inhibitor cough    Past Surgical History  Procedure Laterality Date  . Left knee replacement  03/2008  . Cesarean section      x 3    Family History  Problem Relation Age of Onset  . Hypertension Mother   . Stroke Mother   . Kidney failure Mother   . Aneurysm Mother     brain   . Alcohol abuse Father   . Hypertension Father   . Kidney disease Maternal Grandmother   . Kidney disease Maternal Grandfather   . Kidney disease Paternal Grandmother   . Kidney disease Paternal Grandfather    History  Substance Use Topics  . Smoking status: Never Smoker   . Smokeless tobacco: Not on file  . Alcohol Use: No   OB History    No data available      Review of Systems  Musculoskeletal: Positive for arthralgias.  All other systems reviewed and are  negative.    Allergies  Benazepril hcl  Home Medications   Prior to Admission medications   Medication Sig Start Date End Date Taking? Authorizing Provider  amLODipine (NORVASC) 5 MG tablet Take 1 tablet (5 mg total) by mouth daily. 09/12/14   Doe-Hyun R Shawna Orleans, DO  celecoxib (CELEBREX) 100 MG capsule Take 1 capsule (100 mg total) by mouth 2 (two) times daily. 12/30/14   Maudry Diego, MD  HYDROcodone-acetaminophen (NORCO/VICODIN) 5-325 MG per tablet Take 1 tablet by mouth every 6 (six) hours as needed for moderate pain. 12/30/14   Maudry Diego, MD  lactulose (CHRONULAC) 10 GM/15ML solution Take 15 mLs (10 g total) by mouth daily as needed for mild constipation. 09/12/14   Doe-Hyun R Shawna Orleans, DO  levofloxacin (LEVAQUIN) 500 MG tablet Take 1 tablet (500 mg total) by mouth daily. 12/09/14   Biagio Borg, MD  losartan-hydrochlorothiazide (HYZAAR) 100-12.5 MG per tablet Take 1 tablet by mouth daily. 09/12/14   Doe-Hyun R Shawna Orleans, DO   BP 156/67 mmHg  Pulse 63  Temp(Src) 97.3 F (36.3 C) (Oral)  Resp 18  Ht 5\' 8"  (1.727 m)  Wt 260 lb (117.935 kg)  BMI 39.54 kg/m2  SpO2 100%   Physical Exam  Constitutional: She is oriented to person, place, and time. She  appears well-developed and well-nourished. No distress.  HENT:  Head: Normocephalic and atraumatic.  Eyes: Conjunctivae and EOM are normal.  Neck: Neck supple. No tracheal deviation present.  Cardiovascular: Normal rate.   Pulmonary/Chest: Effort normal. No respiratory distress.  Musculoskeletal: Normal range of motion.  No swelling of neck or shoulder, full ROM of left upper extremity, distal pulsed 2 +  Neurological: She is alert and oriented to person, place, and time.  Skin: Skin is warm and dry.  Psychiatric: She has a normal mood and affect. Her behavior is normal.  Nursing note and vitals reviewed.   ED Course  Procedures (including critical care time) DIAGNOSTIC STUDIES: Oxygen Saturation is 100% on RA, normal by my  interpretation.    COORDINATION OF CARE: 8:37 PM-Discussed treatment plan with pt at bedside and pt agreed to plan.     Labs Review Labs Reviewed - No data to display  Imaging Review Dg Chest 2 View  12/30/2014   CLINICAL DATA:  Nonsmoker, neck pain, left arm pain for 2 months  EXAM: CHEST  2 VIEW  COMPARISON:  03/07/2008  FINDINGS: Cardiomediastinal silhouette is stable. No acute infiltrate or pleural effusion. No pulmonary edema. Mild mid thoracic dextroscoliosis.  IMPRESSION: No active cardiopulmonary disease.   Electronically Signed   By: Lahoma Crocker M.D.   On: 12/30/2014 08:24   Dg Cervical Spine Complete  12/30/2014   CLINICAL DATA:  Neck pain for 2 months, left shoulder pain  EXAM: CERVICAL SPINE  4+ VIEWS  COMPARISON:  10/17/2004  FINDINGS: Five views of cervical spine submitted. There is straightening of cervical spine. No acute fracture or subluxation. Mild degenerative changes C1-C2 articulation. Mild disc space flattening with anterior spurring at C5-C6 level. Minimal disc space flattening at C6-C7 level no prevertebral soft tissue swelling. Mild narrowing of neural foramina bilaterally at C6-C7 level. Mild narrowing of left neural foramina at C5-C6 level.  IMPRESSION: No acute fracture or subluxation. Degenerative changes as described above.   Electronically Signed   By: Lahoma Crocker M.D.   On: 12/30/2014 08:26     EKG Interpretation None      MDM   Final diagnoses:  None   1. Arm pain  Medication Rx changed to Percocet. Hydrocodone Rx given to her this morning collected and dispensed to pharmacy.    I personally performed the services described in this documentation, which was scribed in my presence. The recorded information has been reviewed and is accurate.       Dewaine Oats, PA-C 12/30/14 2052  Mariea Clonts, MD 12/31/14 [phone]

## 2014-12-30 NOTE — ED Notes (Signed)
Per provider-patient can have RX for percocet but has to return norco. Patient gave myself and Lambert Keto, RN 24 Norco.  Medication walked to pharmacy with myself and Lambert Keto and signed for by pharmacy.

## 2014-12-30 NOTE — Discharge Instructions (Signed)
FOLLOW UP WITH YOUR DOCTOR FOR FURTHER OUTPATIENT MANAGEMENT OF ARM PAIN.

## 2015-01-06 ENCOUNTER — Other Ambulatory Visit (HOSPITAL_COMMUNITY): Payer: Self-pay | Admitting: Orthopaedic Surgery

## 2015-01-06 DIAGNOSIS — IMO0002 Reserved for concepts with insufficient information to code with codable children: Secondary | ICD-10-CM

## 2015-01-07 ENCOUNTER — Ambulatory Visit (HOSPITAL_COMMUNITY)
Admission: RE | Admit: 2015-01-07 | Discharge: 2015-01-07 | Disposition: A | Discharge status: Home / Self Care | Payer: 59 | Source: Ambulatory Visit | Attending: Orthopaedic Surgery | Admitting: Orthopaedic Surgery

## 2015-01-07 DIAGNOSIS — IMO0002 Reserved for concepts with insufficient information to code with codable children: Secondary | ICD-10-CM

## 2015-01-07 DIAGNOSIS — R200 Anesthesia of skin: Secondary | ICD-10-CM | POA: Insufficient documentation

## 2015-01-07 DIAGNOSIS — M542 Cervicalgia: Secondary | ICD-10-CM | POA: Insufficient documentation

## 2015-01-07 DIAGNOSIS — M4802 Spinal stenosis, cervical region: Secondary | ICD-10-CM | POA: Diagnosis not present

## 2015-01-07 DIAGNOSIS — R202 Paresthesia of skin: Secondary | ICD-10-CM | POA: Insufficient documentation

## 2015-03-25 ENCOUNTER — Telehealth: Payer: Self-pay

## 2015-03-25 NOTE — Telephone Encounter (Signed)
Left message for pt to call back concerning need for mammogram 

## 2015-07-23 ENCOUNTER — Encounter: Payer: Self-pay | Admitting: Gastroenterology

## 2015-08-11 ENCOUNTER — Other Ambulatory Visit: Payer: Self-pay

## 2015-08-11 DIAGNOSIS — Z1231 Encounter for screening mammogram for malignant neoplasm of breast: Secondary | ICD-10-CM

## 2015-08-25 ENCOUNTER — Ambulatory Visit
Admission: RE | Admit: 2015-08-25 | Discharge: 2015-08-25 | Disposition: A | Discharge status: Home / Self Care | Payer: 59 | Source: Ambulatory Visit

## 2015-08-25 DIAGNOSIS — Z1231 Encounter for screening mammogram for malignant neoplasm of breast: Secondary | ICD-10-CM

## 2015-09-04 ENCOUNTER — Other Ambulatory Visit: Payer: Self-pay | Admitting: Internal Medicine

## 2015-09-16 ENCOUNTER — Other Ambulatory Visit: Payer: Self-pay | Admitting: Orthopedic Surgery

## 2015-09-30 ENCOUNTER — Inpatient Hospital Stay (HOSPITAL_COMMUNITY)
Admission: RE | Admit: 2015-09-30 | Discharge: 2015-09-30 | Disposition: A | Discharge status: Home / Self Care | Payer: 59 | Source: Ambulatory Visit

## 2015-09-30 NOTE — Progress Notes (Signed)
Patient called stating she had pink eye and was not able to come to PAT visit on 09/30/15

## 2015-10-05 ENCOUNTER — Other Ambulatory Visit (HOSPITAL_COMMUNITY): Payer: Self-pay | Admitting: *Deleted

## 2015-10-05 NOTE — Pre-Procedure Instructions (Signed)
Lisa Mcconnell  10/05/2015     Your procedure is scheduled on Monday, October 12, 2015 at 10:00 AM.   Report to [facility] Entrance "A" Admitting Office at 8:00 AM.   Call this number if you have problems the morning of surgery: [phone]   Any questions prior to day of surgery, please call [phone] between 8 & 4 PM.   Remember:  Do not eat food or drink liquids after midnight Sunday, 10/11/15.  Take these medicines the morning of surgery with A SIP OF WATER: Amlodipine (Norvasc), Hydrocodone or Oxycodone - if needed    Stop NSAIDS (Meloxicam, Mobic, Ibuprofen, Aleve, etc.) as of today. Do not use any Aspirin products prior to surgery.          Do not wear jewelry, make-up or nail polish.  Do not wear lotions, powders, or perfumes.  You may wear deodorant.  Do not shave 48 hours prior to surgery.    Do not bring valuables to the hospital.  [facility] is not responsible for any belongings or valuables.  Contacts, dentures or bridgework may not be worn into surgery.  Leave your suitcase in the car.  After surgery it may be brought to your room.  For patients admitted to the hospital, discharge time will be determined by your treatment team.  Special instructions: [facility] - Preparing for Surgery  Before surgery, you can play an important role.  Because skin is not sterile, your skin needs to be as free of germs as possible.  You can reduce the number of germs on you skin by washing with CHG (chlorahexidine gluconate) soap before surgery.  CHG is an antiseptic cleaner which kills germs and bonds with the skin to continue killing germs even after washing.  Please DO NOT use if you have an allergy to CHG or antibacterial soaps.  If your skin becomes reddened/irritated stop using the CHG and inform your nurse when you arrive at Short Stay.  Do not shave (including legs and underarms) for at least 48 hours prior to the first CHG shower.  You may shave your  face.  Please follow these instructions carefully:   1.  Shower with CHG Soap the night before surgery and the                                morning of Surgery.  2.  If you choose to wash your hair, wash your hair first as usual with your       normal shampoo.  3.  After you shampoo, rinse your hair and body thoroughly to remove the                      Shampoo.  4.  Use CHG as you would any other liquid soap.  You can apply chg directly       to the skin and wash gently with scrungie or a clean washcloth.  5.  Apply the CHG Soap to your body ONLY FROM THE NECK DOWN.        Do not use on open wounds or open sores.  Avoid contact with your eyes, ears, mouth and genitals (private parts).  Wash genitals (private parts) with your normal soap.  6.  Wash thoroughly, paying special attention to the area where your surgery        will be performed.  7.  Thoroughly  rinse your body with warm water from the neck down.  8.  DO NOT shower/wash with your normal soap after using and rinsing off       the CHG Soap.  9.  Pat yourself dry with a clean towel.            10.  Wear clean pajamas.            11.  Place clean sheets on your bed the night of your first shower and do not        sleep with pets.  Day of Surgery  Do not apply any lotions the morning of surgery.  Please wear clean clothes to the hospital.   Please read over the following fact sheets that you were given. Pain Booklet, Coughing and Deep Breathing, Blood Transfusion Information, MRSA Information and Surgical Site Infection Prevention

## 2015-10-06 ENCOUNTER — Encounter (HOSPITAL_COMMUNITY)
Admission: RE | Admit: 2015-10-06 | Discharge: 2015-10-06 | Disposition: A | Discharge status: Home / Self Care | Payer: 59 | Source: Ambulatory Visit | Attending: Orthopedic Surgery | Admitting: Orthopedic Surgery

## 2015-10-06 ENCOUNTER — Encounter (HOSPITAL_COMMUNITY): Payer: Self-pay

## 2015-10-06 DIAGNOSIS — Z01812 Encounter for preprocedural laboratory examination: Secondary | ICD-10-CM | POA: Insufficient documentation

## 2015-10-06 DIAGNOSIS — M179 Osteoarthritis of knee, unspecified: Secondary | ICD-10-CM | POA: Insufficient documentation

## 2015-10-06 DIAGNOSIS — Z01818 Encounter for other preprocedural examination: Secondary | ICD-10-CM | POA: Diagnosis not present

## 2015-10-06 DIAGNOSIS — R001 Bradycardia, unspecified: Secondary | ICD-10-CM | POA: Insufficient documentation

## 2015-10-06 DIAGNOSIS — Z0183 Encounter for blood typing: Secondary | ICD-10-CM | POA: Diagnosis not present

## 2015-10-06 HISTORY — DX: Anemia, unspecified: D64.9

## 2015-10-06 HISTORY — DX: Unspecified osteoarthritis, unspecified site: M19.90

## 2015-10-06 LAB — CBC WITH DIFFERENTIAL/PLATELET
BASOS ABS: 0 10*3/uL (ref 0.0–0.1)
BASOS PCT: 1 %
Eosinophils Absolute: 0.3 10*3/uL (ref 0.0–0.7)
Eosinophils Relative: 4 %
HEMATOCRIT: 35.9 % — AB (ref 36.0–46.0)
Hemoglobin: 12.1 g/dL (ref 12.0–15.0)
Lymphocytes Relative: 44 %
Lymphs Abs: 2.8 10*3/uL (ref 0.7–4.0)
MCH: 31.8 pg (ref 26.0–34.0)
MCHC: 33.7 g/dL (ref 30.0–36.0)
MCV: 94.5 fL (ref 78.0–100.0)
MONO ABS: 0.4 10*3/uL (ref 0.1–1.0)
Monocytes Relative: 6 %
NEUTROS ABS: 2.9 10*3/uL (ref 1.7–7.7)
Neutrophils Relative %: 45 %
PLATELETS: 231 10*3/uL (ref 150–400)
RBC: 3.8 MIL/uL — AB (ref 3.87–5.11)
RDW: 13.1 % (ref 11.5–15.5)
WBC: 6.4 10*3/uL (ref 4.0–10.5)

## 2015-10-06 LAB — BASIC METABOLIC PANEL
ANION GAP: 6 (ref 5–15)
BUN: 8 mg/dL (ref 6–20)
CALCIUM: 9.1 mg/dL (ref 8.9–10.3)
CO2: 27 mmol/L (ref 22–32)
Chloride: 105 mmol/L (ref 101–111)
Creatinine, Ser: 0.68 mg/dL (ref 0.44–1.00)
GLUCOSE: 94 mg/dL (ref 65–99)
POTASSIUM: 3.7 mmol/L (ref 3.5–5.1)
SODIUM: 138 mmol/L (ref 135–145)

## 2015-10-06 LAB — URINALYSIS, ROUTINE W REFLEX MICROSCOPIC
Bilirubin Urine: NEGATIVE
GLUCOSE, UA: NEGATIVE mg/dL
HGB URINE DIPSTICK: NEGATIVE
Ketones, ur: NEGATIVE mg/dL
Leukocytes, UA: NEGATIVE
Nitrite: NEGATIVE
Protein, ur: NEGATIVE mg/dL
SPECIFIC GRAVITY, URINE: 1.018 (ref 1.005–1.030)
pH: 6 (ref 5.0–8.0)

## 2015-10-06 LAB — TYPE AND SCREEN
ABO/RH(D): A POS
ANTIBODY SCREEN: NEGATIVE

## 2015-10-06 LAB — PROTIME-INR
INR: 1.07 (ref 0.00–1.49)
Prothrombin Time: 14.1 seconds (ref 11.6–15.2)

## 2015-10-06 LAB — SURGICAL PCR SCREEN
MRSA, PCR: NEGATIVE
Staphylococcus aureus: NEGATIVE

## 2015-10-06 LAB — APTT: APTT: 31 s (ref 24–37)

## 2015-10-06 NOTE — Progress Notes (Signed)
Pt denies cardiac history, chest pain or sob. 

## 2015-10-09 NOTE — H&P (Signed)
TOTAL KNEE ADMISSION H&P  Patient is being admitted for right total knee arthroplasty.  Subjective:  Chief Complaint:right knee pain.  HPI: Lisa Mcconnell, 59 y.o. female, has a history of pain and functional disability in the right knee due to arthritis and has failed non-surgical conservative treatments for greater than 12 weeks to includeNSAID's and/or analgesics, corticosteriod injections, flexibility and strengthening excercises, weight reduction as appropriate and activity modification.  Onset of symptoms was gradual, starting 2 years ago with gradually worsening course since that time. The patient noted no past surgery on the right knee(s).  Patient currently rates pain in the right knee(s) at 10 out of 10 with activity. Patient has night pain, worsening of pain with activity and weight bearing, pain that interferes with activities of daily living, pain with passive range of motion, crepitus and joint swelling.  Patient has evidence of subchondral sclerosis, periarticular osteophytes and joint space narrowing by imaging studies. . There is no active infection.  Patient Active Problem List   Diagnosis Date Noted  . Acute sinus infection 12/09/2014  . MENOPAUSE, EARLY 04/24/2009  . Hypertension 10/06/2008  . PERIPHERAL EDEMA 10/06/2008  . Constipation 09/16/2008  . BLOOD IN STOOL, OCCULT 09/16/2008  . HEMOCCULT POSITIVE STOOL 08/19/2008  . TRANSAMINASES, SERUM, ELEVATED 08/07/2008  . CONSTIPATION, CHRONIC 06/19/2008   Past Medical History  Diagnosis Date  . Obesity   . Hypertension   . Chronic constipation   . Hemorrhoids   . ACE-inhibitor cough   . Arthritis   . Anemia     as a child    Past Surgical History  Procedure Laterality Date  . Left knee replacement  03/2008  . Cesarean section      x 3   . Colonoscopy      No prescriptions prior to admission   Allergies  Allergen Reactions  . Benazepril Hcl     REACTION: cough    Social History  Substance Use Topics   . Smoking status: Never Smoker   . Smokeless tobacco: Never Used  . Alcohol Use: No    Family History  Problem Relation Age of Onset  . Hypertension Mother   . Stroke Mother   . Kidney failure Mother   . Aneurysm Mother     brain   . Alcohol abuse Father   . Hypertension Father   . Heart attack Father   . Kidney disease Maternal Grandmother   . Kidney disease Maternal Grandfather   . Kidney disease Paternal Grandmother   . Kidney disease Paternal Grandfather      Review of Systems  Constitutional: Negative.   HENT: Negative.   Eyes: Negative.   Respiratory: Negative.   Cardiovascular:       HTN  Gastrointestinal: Positive for constipation.  Musculoskeletal: Positive for joint pain.  Skin: Negative.   Neurological: Negative.   Endo/Heme/Allergies: Negative.   Psychiatric/Behavioral: Negative.     Objective:  Physical Exam  Constitutional: She is oriented to person, place, and time. She appears well-developed and well-nourished.  HENT:  Head: Normocephalic and atraumatic.  Eyes: Pupils are equal, round, and reactive to light.  Neck: Normal range of motion. Neck supple.  Cardiovascular: Intact distal pulses.   Respiratory: Effort normal.  Musculoskeletal: She exhibits tenderness.  Surgical scar to the left knee is well-healed, no palpable effusion range of motion is 0/115 limited by adipose tissue.  The right knee is tender along the medial joint line.  There is crepitus as you take her through a  range of motion.  One plus effusion.  Pain is increased with varus stress, although her ligaments are stable.    Neurological: She is alert and oriented to person, place, and time.  Skin: Skin is warm and dry.  Psychiatric: She has a normal mood and affect. Her behavior is normal. Judgment and thought content normal.    Vital signs in last 24 hours:    Labs:   Estimated body mass index is 39.54 kg/(m^2) as calculated from the following:   Height as of 12/30/14: 5\' 8"   (1.727 m).   Weight as of 12/30/14: 117.935 kg (260 lb).   Imaging Review Plain radiographs demonstrate bone-on-bone arthritic changes to the right knee, medial compartment, with some actual erosion of the tibial plateaus.  There are small peripheral osteophytes.  Assessment/Plan:  End stage arthritis, right knee   The patient history, physical examination, clinical judgment of the provider and imaging studies are consistent with end stage degenerative joint disease of the right knee(s) and total knee arthroplasty is deemed medically necessary. The treatment options including medical management, injection therapy arthroscopy and arthroplasty were discussed at length. The risks and benefits of total knee arthroplasty were presented and reviewed. The risks due to aseptic loosening, infection, stiffness, patella tracking problems, thromboembolic complications and other imponderables were discussed. The patient acknowledged the explanation, agreed to proceed with the plan and consent was signed. Patient is being admitted for inpatient treatment for surgery, pain control, PT, OT, prophylactic antibiotics, VTE prophylaxis, progressive ambulation and ADL's and discharge planning. The patient is planning to be discharged home with home health services

## 2015-10-11 DIAGNOSIS — M1711 Unilateral primary osteoarthritis, right knee: Secondary | ICD-10-CM | POA: Diagnosis present

## 2015-10-11 MED ORDER — DEXTROSE 5 % IV SOLN
3.0000 g | INTRAVENOUS | Status: AC
  Administered 2015-10-12: 3 g via INTRAVENOUS
  Filled 2015-10-11: qty 3000

## 2015-10-11 MED ORDER — TRANEXAMIC ACID 1000 MG/10ML IV SOLN
1000.0000 mg | INTRAVENOUS | Status: AC
  Administered 2015-10-12: 1000 mg via INTRAVENOUS
  Filled 2015-10-11: qty 10

## 2015-10-12 ENCOUNTER — Inpatient Hospital Stay (HOSPITAL_COMMUNITY): Payer: 59 | Admitting: Anesthesiology

## 2015-10-12 ENCOUNTER — Encounter (HOSPITAL_COMMUNITY): Payer: Self-pay | Admitting: Anesthesiology

## 2015-10-12 ENCOUNTER — Inpatient Hospital Stay (HOSPITAL_COMMUNITY)
Admission: RE | Admit: 2015-10-12 | Discharge: 2015-10-16 | DRG: 470 | Disposition: A | Discharge status: Home / Self Care | Payer: 59 | Source: Ambulatory Visit | Attending: Orthopedic Surgery | Admitting: Orthopedic Surgery

## 2015-10-12 ENCOUNTER — Encounter (HOSPITAL_COMMUNITY)
Admission: RE | Disposition: A | Discharge status: Home / Self Care | Payer: Self-pay | Source: Ambulatory Visit | Attending: Orthopedic Surgery

## 2015-10-12 DIAGNOSIS — K5909 Other constipation: Secondary | ICD-10-CM | POA: Diagnosis present

## 2015-10-12 DIAGNOSIS — D62 Acute posthemorrhagic anemia: Secondary | ICD-10-CM | POA: Diagnosis not present

## 2015-10-12 DIAGNOSIS — M79661 Pain in right lower leg: Secondary | ICD-10-CM

## 2015-10-12 DIAGNOSIS — M1710 Unilateral primary osteoarthritis, unspecified knee: Secondary | ICD-10-CM | POA: Diagnosis present

## 2015-10-12 DIAGNOSIS — Z96652 Presence of left artificial knee joint: Secondary | ICD-10-CM | POA: Diagnosis present

## 2015-10-12 DIAGNOSIS — Z6841 Body Mass Index (BMI) 40.0 and over, adult: Secondary | ICD-10-CM | POA: Diagnosis not present

## 2015-10-12 DIAGNOSIS — Z888 Allergy status to other drugs, medicaments and biological substances status: Secondary | ICD-10-CM

## 2015-10-12 DIAGNOSIS — I10 Essential (primary) hypertension: Secondary | ICD-10-CM | POA: Diagnosis present

## 2015-10-12 DIAGNOSIS — G8929 Other chronic pain: Secondary | ICD-10-CM | POA: Diagnosis present

## 2015-10-12 DIAGNOSIS — E669 Obesity, unspecified: Secondary | ICD-10-CM | POA: Diagnosis present

## 2015-10-12 DIAGNOSIS — M1711 Unilateral primary osteoarthritis, right knee: Secondary | ICD-10-CM | POA: Diagnosis present

## 2015-10-12 HISTORY — PX: TOTAL KNEE ARTHROPLASTY: SHX125

## 2015-10-12 SURGERY — ARTHROPLASTY, KNEE, TOTAL
Anesthesia: Monitor Anesthesia Care | Laterality: Right

## 2015-10-12 MED ORDER — MIDAZOLAM HCL 2 MG/2ML IJ SOLN
INTRAMUSCULAR | Status: AC
  Filled 2015-10-12: qty 2

## 2015-10-12 MED ORDER — BUPIVACAINE-EPINEPHRINE (PF) 0.5% -1:200000 IJ SOLN
INTRAMUSCULAR | Status: DC | PRN
  Administered 2015-10-12: 20 mL

## 2015-10-12 MED ORDER — LACTATED RINGERS IV SOLN
INTRAVENOUS | Status: DC | PRN
  Administered 2015-10-12 (×2): via INTRAVENOUS

## 2015-10-12 MED ORDER — HYDROMORPHONE HCL 1 MG/ML IJ SOLN
0.2500 mg | INTRAMUSCULAR | Status: DC | PRN
  Administered 2015-10-12 (×4): 0.5 mg via INTRAVENOUS

## 2015-10-12 MED ORDER — OXYCODONE-ACETAMINOPHEN 5-325 MG PO TABS: 1.0000 | ORAL_TABLET | ORAL | Status: DC | PRN

## 2015-10-12 MED ORDER — ASPIRIN EC 325 MG PO TBEC
325.0000 mg | DELAYED_RELEASE_TABLET | Freq: Every day | ORAL | Status: DC
  Administered 2015-10-13 – 2015-10-16 (×4): 325 mg via ORAL
  Filled 2015-10-12 (×4): qty 1

## 2015-10-12 MED ORDER — PHENYLEPHRINE 40 MCG/ML (10ML) SYRINGE FOR IV PUSH (FOR BLOOD PRESSURE SUPPORT)
PREFILLED_SYRINGE | INTRAVENOUS | Status: AC
  Filled 2015-10-12: qty 10

## 2015-10-12 MED ORDER — LIDOCAINE HCL (CARDIAC) 20 MG/ML IV SOLN
INTRAVENOUS | Status: AC
  Filled 2015-10-12: qty 5

## 2015-10-12 MED ORDER — FLEET ENEMA 7-19 GM/118ML RE ENEM: 1.0000 | ENEMA | Freq: Once | RECTAL | Status: DC | PRN

## 2015-10-12 MED ORDER — ACETAMINOPHEN 650 MG RE SUPP: 650.0000 mg | Freq: Four times a day (QID) | RECTAL | Status: DC | PRN

## 2015-10-12 MED ORDER — ONDANSETRON HCL 4 MG/2ML IJ SOLN
4.0000 mg | Freq: Four times a day (QID) | INTRAMUSCULAR | Status: DC | PRN
  Administered 2015-10-12: 4 mg via INTRAVENOUS
  Filled 2015-10-12: qty 2

## 2015-10-12 MED ORDER — METHOCARBAMOL 1000 MG/10ML IJ SOLN
500.0000 mg | Freq: Four times a day (QID) | INTRAVENOUS | Status: DC | PRN
  Administered 2015-10-12 (×2): 500 mg via INTRAVENOUS
  Filled 2015-10-12 (×4): qty 5

## 2015-10-12 MED ORDER — FENTANYL CITRATE (PF) 250 MCG/5ML IJ SOLN
INTRAMUSCULAR | Status: AC
  Filled 2015-10-12: qty 5

## 2015-10-12 MED ORDER — ALUM & MAG HYDROXIDE-SIMETH 200-200-20 MG/5ML PO SUSP: 30.0000 mL | ORAL | Status: DC | PRN

## 2015-10-12 MED ORDER — EPHEDRINE SULFATE 50 MG/ML IJ SOLN
INTRAMUSCULAR | Status: AC
  Filled 2015-10-12: qty 1

## 2015-10-12 MED ORDER — SODIUM CHLORIDE 0.9 % IJ SOLN
INTRAMUSCULAR | Status: DC | PRN
  Administered 2015-10-12: 20 mL

## 2015-10-12 MED ORDER — ACETAMINOPHEN 325 MG PO TABS
650.0000 mg | ORAL_TABLET | Freq: Four times a day (QID) | ORAL | Status: DC | PRN
  Administered 2015-10-14 – 2015-10-15 (×2): 650 mg via ORAL
  Filled 2015-10-12 (×2): qty 2

## 2015-10-12 MED ORDER — HYDROMORPHONE HCL 1 MG/ML IJ SOLN
1.0000 mg | INTRAMUSCULAR | Status: DC | PRN
  Administered 2015-10-12 – 2015-10-13 (×4): 1 mg via INTRAVENOUS
  Filled 2015-10-12 (×4): qty 1

## 2015-10-12 MED ORDER — LOSARTAN POTASSIUM-HCTZ 100-12.5 MG PO TABS: 1.0000 | ORAL_TABLET | Freq: Every day | ORAL | Status: DC

## 2015-10-12 MED ORDER — ASPIRIN EC 325 MG PO TBEC: 325.0000 mg | DELAYED_RELEASE_TABLET | Freq: Two times a day (BID) | ORAL | Status: DC

## 2015-10-12 MED ORDER — METHOCARBAMOL 500 MG PO TABS
500.0000 mg | ORAL_TABLET | Freq: Four times a day (QID) | ORAL | Status: DC | PRN
  Administered 2015-10-13 – 2015-10-16 (×7): 500 mg via ORAL
  Filled 2015-10-12 (×7): qty 1

## 2015-10-12 MED ORDER — CHLORHEXIDINE GLUCONATE 4 % EX LIQD: 60.0000 mL | Freq: Once | CUTANEOUS | Status: DC

## 2015-10-12 MED ORDER — METOCLOPRAMIDE HCL 5 MG/ML IJ SOLN: 5.0000 mg | Freq: Three times a day (TID) | INTRAMUSCULAR | Status: DC | PRN

## 2015-10-12 MED ORDER — LOSARTAN POTASSIUM 50 MG PO TABS
100.0000 mg | ORAL_TABLET | Freq: Every day | ORAL | Status: DC
  Administered 2015-10-13 – 2015-10-16 (×4): 100 mg via ORAL
  Filled 2015-10-12 (×4): qty 2

## 2015-10-12 MED ORDER — DOCUSATE SODIUM 100 MG PO CAPS
100.0000 mg | ORAL_CAPSULE | Freq: Two times a day (BID) | ORAL | Status: DC
  Administered 2015-10-12 – 2015-10-16 (×8): 100 mg via ORAL
  Filled 2015-10-12 (×7): qty 1

## 2015-10-12 MED ORDER — MIDAZOLAM HCL 5 MG/5ML IJ SOLN
INTRAMUSCULAR | Status: DC | PRN
  Administered 2015-10-12: 2 mg via INTRAVENOUS

## 2015-10-12 MED ORDER — PHENOL 1.4 % MT LIQD: 1.0000 | OROMUCOSAL | Status: DC | PRN

## 2015-10-12 MED ORDER — SUCCINYLCHOLINE CHLORIDE 20 MG/ML IJ SOLN
INTRAMUSCULAR | Status: AC
  Filled 2015-10-12: qty 1

## 2015-10-12 MED ORDER — HYDROMORPHONE HCL 1 MG/ML IJ SOLN
0.2500 mg | INTRAMUSCULAR | Status: DC | PRN
  Administered 2015-10-12 (×2): 0.5 mg via INTRAVENOUS

## 2015-10-12 MED ORDER — DIPHENHYDRAMINE HCL 12.5 MG/5ML PO ELIX
12.5000 mg | ORAL_SOLUTION | ORAL | Status: DC | PRN
  Administered 2015-10-13: 25 mg via ORAL
  Filled 2015-10-12: qty 10

## 2015-10-12 MED ORDER — ROCURONIUM BROMIDE 50 MG/5ML IV SOLN
INTRAVENOUS | Status: AC
  Filled 2015-10-12: qty 1

## 2015-10-12 MED ORDER — ONDANSETRON HCL 4 MG PO TABS: 4.0000 mg | ORAL_TABLET | Freq: Four times a day (QID) | ORAL | Status: DC | PRN

## 2015-10-12 MED ORDER — SODIUM CHLORIDE 0.9 % IJ SOLN
INTRAMUSCULAR | Status: AC
  Filled 2015-10-12: qty 10

## 2015-10-12 MED ORDER — ONDANSETRON HCL 4 MG/2ML IJ SOLN
INTRAMUSCULAR | Status: AC
  Filled 2015-10-12: qty 2

## 2015-10-12 MED ORDER — TIZANIDINE HCL 2 MG PO CAPS: 2.0000 mg | ORAL_CAPSULE | Freq: Three times a day (TID) | ORAL | Status: DC

## 2015-10-12 MED ORDER — HYDROCHLOROTHIAZIDE 12.5 MG PO CAPS
12.5000 mg | ORAL_CAPSULE | Freq: Every day | ORAL | Status: DC
  Administered 2015-10-13 – 2015-10-16 (×4): 12.5 mg via ORAL
  Filled 2015-10-12 (×4): qty 1

## 2015-10-12 MED ORDER — OXYCODONE HCL 5 MG PO TABS
ORAL_TABLET | ORAL | Status: AC
  Filled 2015-10-12: qty 2

## 2015-10-12 MED ORDER — HYDROMORPHONE HCL 1 MG/ML IJ SOLN
INTRAMUSCULAR | Status: AC
  Filled 2015-10-12: qty 1

## 2015-10-12 MED ORDER — BUPIVACAINE-EPINEPHRINE (PF) 0.5% -1:200000 IJ SOLN
INTRAMUSCULAR | Status: AC
  Filled 2015-10-12: qty 30

## 2015-10-12 MED ORDER — EPHEDRINE SULFATE 50 MG/ML IJ SOLN
INTRAMUSCULAR | Status: DC | PRN
  Administered 2015-10-12: 5 mg via INTRAVENOUS
  Administered 2015-10-12: 10 mg via INTRAVENOUS
  Administered 2015-10-12 (×2): 5 mg via INTRAVENOUS
  Administered 2015-10-12: 10 mg via INTRAVENOUS

## 2015-10-12 MED ORDER — METOCLOPRAMIDE HCL 5 MG PO TABS: 5.0000 mg | ORAL_TABLET | Freq: Three times a day (TID) | ORAL | Status: DC | PRN

## 2015-10-12 MED ORDER — SODIUM CHLORIDE 0.9 % IR SOLN
Status: DC | PRN
  Administered 2015-10-12: 1000 mL

## 2015-10-12 MED ORDER — SENNOSIDES-DOCUSATE SODIUM 8.6-50 MG PO TABS: 1.0000 | ORAL_TABLET | Freq: Every evening | ORAL | Status: DC | PRN

## 2015-10-12 MED ORDER — BUPIVACAINE IN DEXTROSE 0.75-8.25 % IT SOLN
INTRATHECAL | Status: DC | PRN
  Administered 2015-10-12: 15 mg via INTRATHECAL

## 2015-10-12 MED ORDER — MENTHOL 3 MG MT LOZG: 1.0000 | LOZENGE | OROMUCOSAL | Status: DC | PRN

## 2015-10-12 MED ORDER — DEXTROSE-NACL 5-0.45 % IV SOLN: INTRAVENOUS | Status: DC

## 2015-10-12 MED ORDER — PROPOFOL 500 MG/50ML IV EMUL
INTRAVENOUS | Status: DC | PRN
  Administered 2015-10-12: 80 ug/kg/min via INTRAVENOUS

## 2015-10-12 MED ORDER — KCL IN DEXTROSE-NACL 20-5-0.45 MEQ/L-%-% IV SOLN
INTRAVENOUS | Status: DC
  Administered 2015-10-12: 18:00:00 via INTRAVENOUS
  Administered 2015-10-13: 125 mL/h via INTRAVENOUS
  Filled 2015-10-12 (×2): qty 1000

## 2015-10-12 MED ORDER — BISACODYL 5 MG PO TBEC
5.0000 mg | DELAYED_RELEASE_TABLET | Freq: Every day | ORAL | Status: DC | PRN
  Administered 2015-10-14: 5 mg via ORAL
  Filled 2015-10-12: qty 1

## 2015-10-12 MED ORDER — OXYCODONE HCL 5 MG PO TABS
5.0000 mg | ORAL_TABLET | ORAL | Status: DC | PRN
  Administered 2015-10-12 – 2015-10-13 (×6): 10 mg via ORAL
  Administered 2015-10-13 (×2): 5 mg via ORAL
  Administered 2015-10-13 – 2015-10-16 (×15): 10 mg via ORAL
  Filled 2015-10-12 (×3): qty 2
  Filled 2015-10-12: qty 1
  Filled 2015-10-12 (×14): qty 2
  Filled 2015-10-12: qty 1
  Filled 2015-10-12 (×3): qty 2

## 2015-10-12 MED ORDER — BUPIVACAINE LIPOSOME 1.3 % IJ SUSP
20.0000 mL | Freq: Once | INTRAMUSCULAR | Status: AC
  Administered 2015-10-12: 20 mL
  Filled 2015-10-12: qty 20

## 2015-10-12 MED ORDER — AMLODIPINE BESYLATE 5 MG PO TABS
5.0000 mg | ORAL_TABLET | Freq: Every day | ORAL | Status: DC
  Administered 2015-10-12 – 2015-10-16 (×5): 5 mg via ORAL
  Filled 2015-10-12 (×5): qty 1

## 2015-10-12 MED ORDER — FENTANYL CITRATE (PF) 100 MCG/2ML IJ SOLN
INTRAMUSCULAR | Status: DC | PRN
  Administered 2015-10-12 (×2): 50 ug via INTRAVENOUS

## 2015-10-12 MED ORDER — LACTATED RINGERS IV SOLN
INTRAVENOUS | Status: DC
  Administered 2015-10-12: 09:00:00 via INTRAVENOUS

## 2015-10-12 SURGICAL SUPPLY — 58 items
BANDAGE ESMARK 6X9 LF (GAUZE/BANDAGES/DRESSINGS) ×1 IMPLANT
BLADE SAG 18X100X1.27 (BLADE) ×3 IMPLANT
BLADE SAW SGTL 13X75X1.27 (BLADE) ×3 IMPLANT
BLADE SURG ROTATE 9660 (MISCELLANEOUS) IMPLANT
BNDG ELASTIC 6X10 VLCR STRL LF (GAUZE/BANDAGES/DRESSINGS) ×3 IMPLANT
BNDG ESMARK 6X9 LF (GAUZE/BANDAGES/DRESSINGS) ×3
BOWL SMART MIX CTS (DISPOSABLE) ×3 IMPLANT
CAPT KNEE TOTAL 3 ATTUNE ×3 IMPLANT
CEMENT HV SMART SET (Cement) ×6 IMPLANT
COVER SURGICAL LIGHT HANDLE (MISCELLANEOUS) ×3 IMPLANT
CUFF TOURNIQUET SINGLE 34IN LL (TOURNIQUET CUFF) IMPLANT
CUFF TOURNIQUET SINGLE 44IN (TOURNIQUET CUFF) ×3 IMPLANT
DRAPE EXTREMITY T 121X128X90 (DRAPE) ×3 IMPLANT
DRAPE U-SHAPE 47X51 STRL (DRAPES) ×3 IMPLANT
DURAPREP 26ML APPLICATOR (WOUND CARE) ×6 IMPLANT
ELECT REM PT RETURN 9FT ADLT (ELECTROSURGICAL) ×3
ELECTRODE REM PT RTRN 9FT ADLT (ELECTROSURGICAL) ×1 IMPLANT
EVACUATOR 1/8 PVC DRAIN (DRAIN) IMPLANT
GAUZE SPONGE 4X4 12PLY STRL (GAUZE/BANDAGES/DRESSINGS) ×6 IMPLANT
GAUZE XEROFORM 1X8 LF (GAUZE/BANDAGES/DRESSINGS) ×3 IMPLANT
GLOVE BIO SURGEON STRL SZ7.5 (GLOVE) ×6 IMPLANT
GLOVE BIO SURGEON STRL SZ8.5 (GLOVE) ×6 IMPLANT
GLOVE BIOGEL PI IND STRL 8 (GLOVE) ×2 IMPLANT
GLOVE BIOGEL PI IND STRL 9 (GLOVE) ×1 IMPLANT
GLOVE BIOGEL PI INDICATOR 8 (GLOVE) ×4
GLOVE BIOGEL PI INDICATOR 9 (GLOVE) ×2
GLOVE ECLIPSE 6.5 STRL STRAW (GLOVE) ×3 IMPLANT
GOWN STRL REUS W/ TWL LRG LVL3 (GOWN DISPOSABLE) ×1 IMPLANT
GOWN STRL REUS W/ TWL XL LVL3 (GOWN DISPOSABLE) ×2 IMPLANT
GOWN STRL REUS W/TWL LRG LVL3 (GOWN DISPOSABLE) ×2
GOWN STRL REUS W/TWL XL LVL3 (GOWN DISPOSABLE) ×4
HANDPIECE INTERPULSE COAX TIP (DISPOSABLE) ×2
HOOD PEEL AWAY FACE SHEILD DIS (HOOD) ×6 IMPLANT
KIT BASIN OR (CUSTOM PROCEDURE TRAY) ×3 IMPLANT
KIT ROOM TURNOVER OR (KITS) ×3 IMPLANT
MANIFOLD NEPTUNE II (INSTRUMENTS) ×3 IMPLANT
NEEDLE SPNL 18GX3.5 QUINCKE PK (NEEDLE) ×3 IMPLANT
NS IRRIG 1000ML POUR BTL (IV SOLUTION) ×3 IMPLANT
PACK TOTAL JOINT (CUSTOM PROCEDURE TRAY) ×3 IMPLANT
PACK UNIVERSAL I (CUSTOM PROCEDURE TRAY) ×3 IMPLANT
PAD ARMBOARD 7.5X6 YLW CONV (MISCELLANEOUS) ×6 IMPLANT
PADDING CAST COTTON 6X4 STRL (CAST SUPPLIES) ×3 IMPLANT
SET HNDPC FAN SPRY TIP SCT (DISPOSABLE) ×1 IMPLANT
SPONGE LAP 18X18 X RAY DECT (DISPOSABLE) ×3 IMPLANT
SUT VIC AB 0 CT1 27 (SUTURE) ×2
SUT VIC AB 0 CT1 27XBRD ANBCTR (SUTURE) ×1 IMPLANT
SUT VIC AB 1 CTX 36 (SUTURE) ×4
SUT VIC AB 1 CTX36XBRD ANBCTR (SUTURE) ×2 IMPLANT
SUT VIC AB 2-0 CT1 27 (SUTURE) ×4
SUT VIC AB 2-0 CT1 TAPERPNT 27 (SUTURE) ×2 IMPLANT
SUT VIC AB 3-0 CT1 27 (SUTURE) ×4
SUT VIC AB 3-0 CT1 TAPERPNT 27 (SUTURE) ×2 IMPLANT
SUT VIC AB 3-0 FS2 27 (SUTURE) ×3 IMPLANT
SYR 50ML LL SCALE MARK (SYRINGE) ×3 IMPLANT
TOWEL OR 17X24 6PK STRL BLUE (TOWEL DISPOSABLE) ×3 IMPLANT
TOWEL OR 17X26 10 PK STRL BLUE (TOWEL DISPOSABLE) ×3 IMPLANT
TRAY CATH 16FR W/PLASTIC CATH (SET/KITS/TRAYS/PACK) ×3 IMPLANT
WATER STERILE IRR 1000ML POUR (IV SOLUTION) ×9 IMPLANT

## 2015-10-12 NOTE — Evaluation (Signed)
Physical Therapy Evaluation Patient Details Name: Lisa Mcconnell MRN: [id number] DOB: 1956-01-12 Today's Date: 10/12/2015   History of Present Illness  s/p Rt TKR  PMHx-Lt TKR '09,   Clinical Impression  Pt is s/p TKA resulting in the deficits listed below (see PT Problem List).  Pt will benefit from skilled PT to increase their independence and safety with mobility to allow discharge to the venue listed below.      Follow Up Recommendations Home health PT;Supervision for mobility/OOB    Equipment Recommendations  3in1 (PT)    Recommendations for Other Services OT consult     Precautions / Restrictions Precautions Precautions: Knee Restrictions Weight Bearing Restrictions: Yes RLE Weight Bearing: Weight bearing as tolerated      Mobility  Bed Mobility Overal bed mobility: Needs Assistance Bed Mobility: Supine to Sit     Supine to sit: Min assist     General bed mobility comments: pt moved to long-sitting and then used her arms to help move RLE with assist of PT off bed  Transfers Overall transfer level: Needs assistance Equipment used: Rolling walker (2 wheeled) Transfers: Sit to/from Stand Sit to Stand: Min assist;+2 safety/equipment;From elevated surface         General transfer comment: vc for safe use of RW; controlled descent  Ambulation/Gait Ambulation/Gait assistance: Min assist Ambulation Distance (Feet): 11 Feet Assistive device: Rolling walker (2 wheeled) Gait Pattern/deviations: Step-to pattern;Decreased stride length;Antalgic Gait velocity: slow   General Gait Details: slow, good weightbearing through RLE   Stairs            Wheelchair Mobility    Modified Rankin (Stroke Patients Only)       Balance                                             Pertinent Vitals/Pain Pain Assessment: Faces Faces Pain Scale: Hurts worst Pain Location: Rt knee Pain Descriptors / Indicators: Operative site  guarding;Grimacing;Guarding Pain Intervention(s): Limited activity within patient's tolerance;Monitored during session;Premedicated before session;Repositioned (by end of session, pt sleeping; NT to refill Ice bag)    Home Living Family/patient expects to be discharged to:: Private residence Living Arrangements: Spouse/significant other Available Help at Discharge: Family;Available 24 hours/day Type of Home: House Home Access: Stairs to enter Entrance Stairs-Rails: Psychiatric nurse of Steps: 3 Home Layout: One level Home Equipment: Walker - 2 wheels Additional Comments: family interested in 3n1 for over toilet and tub seat (pt too sleepy)    Prior Function Level of Independence: Independent               Hand Dominance        Extremity/Trunk Assessment   Upper Extremity Assessment: Overall WFL for tasks assessed           Lower Extremity Assessment: RLE deficits/detail RLE Deficits / Details: Pt with 2+/5 quads (nearly SLR while removing CPM); knee ROM 0-45    Cervical / Trunk Assessment: Other exceptions  Communication   Communication: No difficulties  Cognition Arousal/Alertness: Awake/alert;Lethargic;Suspect due to medications (alert initially) Behavior During Therapy: Impulsive (initially) Overall Cognitive Status: Difficult to assess                      General Comments      Exercises        Assessment/Plan    PT Assessment Patient needs continued  PT services  PT Diagnosis Difficulty walking;Acute pain   PT Problem List Decreased strength;Decreased range of motion;Decreased activity tolerance;Decreased mobility;Decreased knowledge of use of DME;Decreased knowledge of precautions;Obesity;Pain  PT Treatment Interventions DME instruction;Gait training;Stair training;Functional mobility training;Therapeutic activities;Therapeutic exercise;Patient/family education   PT Goals (Current goals can be found in the Care Plan section)  Acute Rehab PT Goals Patient Stated Goal: unable due to lethargy  PT Goal Formulation: With family Time For Goal Achievement: 10/15/15 Potential to Achieve Goals: Good    Frequency 7X/week   Barriers to discharge        Co-evaluation               End of Session Equipment Utilized During Treatment: Gait belt;Oxygen Activity Tolerance: Patient limited by pain;Patient limited by lethargy Patient left: in chair;with call bell/phone within reach;with family/visitor present Nurse Communication: Mobility status;Other (comment) (please refill ice pack)         Time: [id number] PT Time Calculation (min) (ACUTE ONLY): 29 min   Charges:   PT Evaluation $Initial PT Evaluation Tier I: 1 Procedure PT Treatments $Gait Training: 8-22 mins   PT G Codes:        Anselmo Reihl 10-21-15, 5:10 PM  Pager [phone]

## 2015-10-12 NOTE — Anesthesia Postprocedure Evaluation (Signed)
Anesthesia Post Note  Patient: Lisa Mcconnell  Procedure(s) Performed: Procedure(s) (LRB): TOTAL KNEE ARTHROPLASTY (Right)  Patient location during evaluation: PACU Anesthesia Type: MAC Level of consciousness: awake and alert Pain management: pain level controlled Vital Signs Assessment: post-procedure vital signs reviewed and stable Respiratory status: spontaneous breathing and respiratory function stable Cardiovascular status: stable Anesthetic complications: no    Last Vitals:  Filed Vitals:   10/12/15 1245 10/12/15 1300  BP: 142/68 151/70  Pulse: 54 51  Temp:    Resp: 9 11    Last Pain:  Filed Vitals:   10/12/15 1303  PainSc: Asleep                 Loa Idler DANIEL

## 2015-10-12 NOTE — Care Management Note (Signed)
Case Management Note  Patient Details  Name: Lisa BROWNBACK MRN: [id number] Date of Birth: May 03, 1956  Subjective/Objective:   59 yr old female s/p right total knee arthroplasty.                Action/Plan: Patient was preopertively setup with [facility], no changes. Case manager will continue to monitor.   Expected Discharge Date:                  Expected Discharge Plan:   Home with Home Health Physical therapy  In-House Referral:     Discharge planning Services  CM Consult  Post Acute Care Choice:  Durable Medical Equipment, Home Health Choice offered to:  Patient  DME Arranged:  3-N-1, CPM, Walker rolling DME Agency:  [employer]  HH Arranged:  PT HH Agency:  [facility]  Status of Service:  In process, will continue to follow  Medicare Important Message Given:    Date Medicare IM Given:    Medicare IM give by:    Date Additional Medicare IM Given:    Additional Medicare Important Message give by:     If discussed at [place] of Stay Meetings, dates discussed:    Additional Comments:  Ninfa Meeker, RN 10/12/2015, 3:23 PM

## 2015-10-12 NOTE — Anesthesia Preprocedure Evaluation (Addendum)
Anesthesia Evaluation  Patient identified by MRN, date of birth, ID band Patient awake    Reviewed: Allergy & Precautions, H&P , NPO status , Patient's Chart, lab work & pertinent test results  Airway Mallampati: II  TM Distance: >3 FB Neck ROM: Full    Dental no notable dental hx. (+) Teeth Intact, Dental Advisory Given   Pulmonary neg pulmonary ROS,    Pulmonary exam normal breath sounds clear to auscultation       Cardiovascular hypertension, Pt. on medications  Rhythm:Regular Rate:Normal     Neuro/Psych negative neurological ROS  negative psych ROS   GI/Hepatic negative GI ROS, Neg liver ROS,   Endo/Other  Morbid obesity  Renal/GU negative Renal ROS  negative genitourinary   Musculoskeletal  (+) Arthritis , Osteoarthritis,    Abdominal   Peds  Hematology negative hematology ROS (+)   Anesthesia Other Findings   Reproductive/Obstetrics negative OB ROS                            Anesthesia Physical Anesthesia Plan  ASA: III  Anesthesia Plan: MAC and Spinal   Post-op Pain Management:    Induction: Intravenous  Airway Management Planned: Simple Face Mask  Additional Equipment:   Intra-op Plan:   Post-operative Plan:   Informed Consent: I have reviewed the patients History and Physical, chart, labs and discussed the procedure including the risks, benefits and alternatives for the proposed anesthesia with the patient or authorized representative who has indicated his/her understanding and acceptance.   Dental advisory given  Plan Discussed with: CRNA  Anesthesia Plan Comments:         Anesthesia Quick Evaluation

## 2015-10-12 NOTE — Transfer of Care (Signed)
Immediate Anesthesia Transfer of Care Note  Patient: Lisa Mcconnell  Procedure(s) Performed: Procedure(s): TOTAL KNEE ARTHROPLASTY (Right)  Patient Location: PACU  Anesthesia Type:MAC and Spinal  Level of Consciousness: awake, alert , oriented and patient cooperative  Airway & Oxygen Therapy: Patient Spontanous Breathing  Post-op Assessment: Report given to RN and Post -op Vital signs reviewed and stable  Post vital signs: Reviewed and stable  Last Vitals:  Filed Vitals:   10/12/15 0817  BP: 149/54  Pulse: 69  Temp: 36.8 C  Resp: 20    Complications: No apparent anesthesia complications

## 2015-10-12 NOTE — Discharge Instructions (Signed)

## 2015-10-12 NOTE — Progress Notes (Signed)
Utilization review completed.  

## 2015-10-12 NOTE — Anesthesia Procedure Notes (Signed)
Spinal Patient location during procedure: OR Start time: 10/12/2015 9:52 AM End time: 10/12/2015 10:01 AM Staffing Anesthesiologist: Duane Boston Performed by: anesthesiologist  Preanesthetic Checklist Completed: patient identified, surgical consent, pre-op evaluation, timeout performed, IV checked, risks and benefits discussed and monitors and equipment checked Spinal Block Patient position: sitting Prep: DuraPrep Patient monitoring: cardiac monitor, continuous pulse ox and blood pressure Approach: right paramedian Location: L3-4 Injection technique: single-shot Needle Needle type: Pencan  Needle gauge: 24 G Needle length: 9 cm Additional Notes Functioning IV was confirmed and monitors were applied. Sterile prep and drape, including hand hygiene and sterile gloves were used. The patient was positioned and the spine was prepped. The skin was anesthetized with lidocaine. Midline approach X 3, unsuccessful moved to paramedian with success. Free flow of clear CSF was obtained prior to injecting local anesthetic into the CSF.  The spinal needle aspirated freely following injection.  The needle was carefully withdrawn.  The patient tolerated the procedure well.

## 2015-10-12 NOTE — Interval H&P Note (Signed)
History and Physical Interval Note:  10/12/2015 9:27 AM  Lisa Mcconnell  has presented today for surgery, with the diagnosis of RIGHT KNEE OSTEOARTHRITIS  The various methods of treatment have been discussed with the patient and family. After consideration of risks, benefits and other options for treatment, the patient has consented to  Procedure(s): TOTAL KNEE ARTHROPLASTY (Right) as a surgical intervention .  The patient's history has been reviewed, patient examined, no change in status, stable for surgery.  I have reviewed the patient's chart and labs.  Questions were answered to the patient's satisfaction.     Kerin Salen

## 2015-10-12 NOTE — Op Note (Signed)
PATIENT ID:      Lisa Mcconnell  MRN:     [id number] DOB/AGE:    October 10, 1956 / 59 y.o.       OPERATIVE REPORT    DATE OF PROCEDURE:  10/12/2015       PREOPERATIVE DIAGNOSIS:   RIGHT KNEE OSTEOARTHRITIS      Estimated body mass index is 40.76 kg/(m^2) as calculated from the following:   Height as of this encounter: 5\' 8"  (1.727 m).   Weight as of this encounter: 121.564 kg (268 lb).                                                        POSTOPERATIVE DIAGNOSIS:   RIGHT KNEE OSTEOARTHRITIS                                                                      PROCEDURE:  Procedure(s): TOTAL KNEE ARTHROPLASTY Using DepuyAttune RP implants #5R Femur, #6Tibia, 5 mm Attune RP bearing, 38 Patella     SURGEON: Kallista Pae J    ASSISTANT:   [employer] K[employer]   (Present and scrubbed throughout the case, critical for assistance with exposure, retraction, instrumentation, and closure.)         ANESTHESIA: spinal, Exparel, marcaine  EBL: 300  FLUID REPLACEMENT: 1800 crystalloid  TOURNIQUET TIME: 66min  Drains: None  Tranexamic Acid: 1gm iv   COMPLICATIONS:  None         INDICATIONS FOR PROCEDURE: The patient has  RIGHT KNEE OSTEOARTHRITIS, varus deformities, XR shows bone on bone arthritis, lateral subluxation of tibia. Patient has failed all conservative measures including anti-inflammatory medicines, narcotics, attempts at  exercise and weight loss, cortisone injections and viscosupplementation.  Risks and benefits of surgery have been discussed, questions answered.   DESCRIPTION OF PROCEDURE: The patient identified by armband, received  IV antibiotics, in the holding area at [facility]. Patient taken to the operating room, appropriate anesthetic  monitors were attached, and spinal anesthesia was  induced. Tourniquet  applied high to the operative thigh. Lateral post and foot positioner  applied to the table, the lower extremity was then prepped and draped  in usual sterile fashion  from the toes to the tourniquet. Time-out procedure was performed. We began the operation, with the knee flexed 120 degrees, by making the anterior midline incision starting at handbreadth above the patella going over the patella 1 cm medial to and 4 cm distal to the tibial tubercle. Small bleeders in the skin and the  subcutaneous tissue identified and cauterized. Transverse retinaculum was incised and reflected medially and a medial parapatellar arthrotomy was accomplished. the patella was everted and theprepatellar fat pad resected. The superficial medial collateral  ligament was then elevated from anterior to posterior along the proximal  flare of the tibia and anterior half of the menisci resected. The knee was hyperflexed exposing bone on bone arthritis. Peripheral and notch osteophytes as well as the cruciate ligaments were then resected. We continued to  work our way around posteriorly along the proximal tibia, and externally  rotated  the tibia subluxing it out from underneath the femur. A McHale  retractor was placed through the notch and a lateral Hohmann retractor  placed, and we then drilled through the proximal tibia in line with the  axis of the tibia followed by an intramedullary guide rod and 2-degree  posterior slope cutting guide. The tibial cutting guide, 3 degree posterior sloped, was pinned into place allowing resection of 6 mm of bone medially and 12 mm of bone laterally. Satisfied with the tibial resection, we then  entered the distal femur 2 mm anterior to the PCL origin with the  intramedullary guide rod and applied the distal femoral cutting guide  set at 9 mm, with 5 degrees of valgus. This was pinned along the  epicondylar axis. At this point, the distal femoral cut was accomplished without difficulty. We then sized for a #5R femoral component and pinned the guide in 3 degrees of external rotation. The chamfer cutting guide was pinned into place. The anterior, posterior, and  chamfer cuts were accomplished without difficulty followed by  the Attune RP box cutting guide and the box cut. We also removed posterior osteophytes from the posterior femoral condyles. At this  time, the knee was brought into full extension. We checked our  extension and flexion gaps and found them symmetric for a 8 mm bearing. Distracting in extension with a lamina spreader, the posterior horns of the menisci were removed, and Exparel, diluted to 60 cc, was injected into the capsule and synovium of the knee. The posterior patella cut was accomplished with the 9.5 mm Attune cutting guide, sized at 38 dome, and the fixation pegs drilled.The knee  was then once again hyperflexed exposing the proximal tibia. We sized for a # 6 tibial base plate, applied the smokestack and the conical reamer followed by the the Delta fin keel punch. We then hammered into place the Attune RP trial femoral component, drilled the lugs, inserted a  8 mm trial bearing, trial patellar button, and took the knee through range of motion from 0-130 degrees. No thumb pressure was required for patellar Tracking. At this point, the limb was wrapped with an Esmarch bandage and the tourniquet inflated to 350 mmHg. All trial components were removed, mating surfaces irrigated with pulse lavage, and dried with suction and sponges. A double batch of DePuy HV cement with 1500 mg of Zinacef was mixed and applied to all bony metallic mating surfaces except for the posterior condyles of the femur itself. In order, we  hammered into place the tibial tray and removed excess cement, the femoral component and removed excess cement. The final Attune RP bearing  was inserted, and the knee brought to full extension with compression.  The patellar button was clamped into place, and excess cement  removed. While the cement cured the wound was irrigated out with normal saline solution pulse lavage. Ligament stability and patellar tracking were checked and  found to be excellent. The parapatellar arthrotomy was closed with  running #1 Vicryl suture. The subcutaneous tissue with 0 and 2-0 undyed  Vicryl suture, and the skin with running 3-0 SQ vicryl. A dressing of Xeroform,  4 x 4, dressing sponges, Webril, and Ace wrap applied. The patient  awakened, and taken to recovery room without difficulty.   Datrell Dunton J 10/12/2015, 11:30 AM

## 2015-10-13 ENCOUNTER — Encounter (HOSPITAL_COMMUNITY): Payer: Self-pay | Admitting: General Practice

## 2015-10-13 LAB — CBC
HCT: 34.7 % — ABNORMAL LOW (ref 36.0–46.0)
Hemoglobin: 11.5 g/dL — ABNORMAL LOW (ref 12.0–15.0)
MCH: 31.4 pg (ref 26.0–34.0)
MCHC: 33.1 g/dL (ref 30.0–36.0)
MCV: 94.8 fL (ref 78.0–100.0)
Platelets: 204 10*3/uL (ref 150–400)
RBC: 3.66 MIL/uL — AB (ref 3.87–5.11)
RDW: 13.2 % (ref 11.5–15.5)
WBC: 12.8 10*3/uL — AB (ref 4.0–10.5)

## 2015-10-13 LAB — BASIC METABOLIC PANEL
Anion gap: 10 (ref 5–15)
BUN: 7 mg/dL (ref 6–20)
CALCIUM: 9.2 mg/dL (ref 8.9–10.3)
CO2: 25 mmol/L (ref 22–32)
CREATININE: 0.73 mg/dL (ref 0.44–1.00)
Chloride: 100 mmol/L — ABNORMAL LOW (ref 101–111)
Glucose, Bld: 142 mg/dL — ABNORMAL HIGH (ref 65–99)
Potassium: 3.9 mmol/L (ref 3.5–5.1)
SODIUM: 135 mmol/L (ref 135–145)

## 2015-10-13 MED ORDER — HYDROMORPHONE HCL 1 MG/ML IJ SOLN
1.0000 mg | INTRAMUSCULAR | Status: DC | PRN
Start: 2015-10-13 — End: 2015-10-16
  Administered 2015-10-13: 1 mg via INTRAVENOUS
  Administered 2015-10-13: 2 mg via INTRAVENOUS
  Administered 2015-10-13 (×2): 1 mg via INTRAVENOUS
  Filled 2015-10-13: qty 2
  Filled 2015-10-13 (×2): qty 1
  Filled 2015-10-13: qty 2

## 2015-10-13 NOTE — Progress Notes (Signed)
Md on call informed that pt states that her pain is not being managed and requesting a PCA. New Orders received. Arthor Captain LPN

## 2015-10-13 NOTE — Progress Notes (Signed)
Physical Therapy Treatment Patient Details Name: Lisa Mcconnell MRN: [id number] DOB: Jan 20, 1956 Today's Date: 10/13/2015    History of Present Illness s/p Rt TKR  PMHx-Lt TKR '09,     PT Comments    Pt stated she was in a significant amount of pain when SPTA and PT entered room. Once sitting EOB, pt got sick, however, she was able to continue with treatment stating multiple times she knew she needed to get up and move. Nice, stable R knee in stance. Pt was informed therapy will be back later today to prepare her for when she discharges to home.  Follow Up Recommendations  Home health PT;Supervision for mobility/OOB     Equipment Recommendations  3in1 (PT)       Precautions / Restrictions Precautions Precautions: Knee Restrictions Weight Bearing Restrictions: Yes RLE Weight Bearing: Weight bearing as tolerated    Mobility  Bed Mobility Overal bed mobility: Needs Assistance Bed Mobility: Supine to Sit     Supine to sit: Min assist     General bed mobility comments: Pt required min assistance to help move RLE off of bed. Pt was able to use BUE to turn and scoot EOB.  Transfers Overall transfer level: Needs assistance Equipment used: Rolling walker (2 wheeled) Transfers: Sit to/from Stand Sit to Stand: Min assist         General transfer comment: Pt required min assistance to help stand from EOB. RW was blocked for safety while pt stood up.  Ambulation/Gait Ambulation/Gait assistance: Min guard Ambulation Distance (Feet): 30 Feet Assistive device: Rolling walker (2 wheeled) Gait Pattern/deviations: Step-to pattern;Trunk flexed;Decreased stride length Gait velocity: decreased Gait velocity interpretation: <1.8 ft/sec, indicative of risk for recurrent falls General Gait Details: Pt required min guard during gait training for safety. Verbal cuing to open eyes during gait training. Pt was able to WB through RLE even though she was in a significant amount of pain.                   Cognition Arousal/Alertness: Awake/alert;Lethargic;Suspect due to medications (alert before pain meds were given) Behavior During Therapy: [facility] for tasks assessed/performed                        Exercises Total Joint Exercises Ankle Circles/Pumps: AROM;AAROM;Both;20 reps;Supine (HOB elevated) Quad Sets: AROM;Right;10 reps;Supine (HOB elevated) Other exercises: Pt was able to perform pre-gait activitiy; pt was able to perform single leg stance (x 3 bouts with RW) to gain knowledge of pts R knee stability before gait training. Pt required +2 for physical assistance, SPTA was blocking R knee while PT was guarding pts trunk.       Pertinent Vitals/Pain Pain Assessment: 0-10 Pain Score: 9  Pain Location: right knee Pain Descriptors / Indicators: Grimacing;Guarding Pain Intervention(s): Limited activity within patient's tolerance;RN gave pain meds during session           PT Goals (current goals can now be found in the care plan section) Progress towards PT goals: Progressing toward goals    Frequency  7X/week    PT Plan Current plan remains appropriate       End of Session Equipment Utilized During Treatment: Gait belt Activity Tolerance: Patient limited by pain;Patient limited by lethargy Patient left: in chair;with call bell/phone within reach;with family/visitor present     Time: [id number] PT Time Calculation (min) (ACUTE ONLY): 34 min  Charges:    2 Gait  Sundra Aland, [place] OFFICE  10/13/2015, 10:10 AM   Roney Marion, PT  Acute Rehabilitation Services Pager [phone] Office [phone]'

## 2015-10-13 NOTE — Progress Notes (Signed)
Pt not tolerating regular diet started back on clears liquid. Arthor Captain LPN

## 2015-10-13 NOTE — Progress Notes (Signed)
Patient ID: Lisa Mcconnell, female   DOB: June 01, 1956, 59 y.o.   MRN: [id number] PATIENT ID: Lisa Mcconnell  MRN: [id number]  DOB/AGE:  Jun 09, 1956 / 59 y.o.  1 Day Post-Op Procedure(s) (LRB): TOTAL KNEE ARTHROPLASTY (Right)    PROGRESS NOTE Subjective: Patient is alert, oriented, x2 Nausea, no Vomiting, yes passing gas. Taking PO sips. Denies SOB, Chest or Calf Pain. Using Incentive Spirometer, PAS in place. Ambulate 11 ft yesterday, CPM 0-40 Patient reports pain as 10/10, but sleeping, arousable. .    Objective: Vital signs in last 24 hours: Filed Vitals:   10/12/15 2100 10/13/15 0230 10/13/15 0606 10/13/15 0701  BP: 144/58 126/54 173/65 130/50  Pulse: 54 60 75 65  Temp: 97.7 F (36.5 C) 98.4 F (36.9 C) 98 F (36.7 C)   TempSrc: Oral Oral Oral   Resp: 16 14 16    Height:      Weight:      SpO2: 99% 100% 99%       Intake/Output from previous day: I/O last 3 completed shifts: In: 1400 [I.V.:1400] Out: 550 [Urine:550]   Intake/Output this shift:     LABORATORY DATA: No results for input(s): WBC, HGB, HCT, PLT, NA, K, CL, CO2, BUN, CREATININE, GLUCOSE, GLUCAP, INR, CALCIUM in the last 72 hours.  Invalid input(s): PT, 2  Examination: Neurologically intact ABD soft Neurovascular intact Sensation intact distally Intact pulses distally Dorsiflexion/Plantar flexion intact Incision: dressing C/D/I No cellulitis present Compartment soft}  Assessment:   1 Day Post-Op Procedure(s) (LRB): TOTAL KNEE ARTHROPLASTY (Right) ADDITIONAL DIAGNOSIS: Expected Acute Blood Loss Anemia, Hypertension  Plan: PT/OT WBAT, CPM 5/hrs day until ROM 0-90 degrees, then D/C CPM DVT Prophylaxis:  SCDx72hrs, ASA 325 mg BID x 2 weeks DISCHARGE PLAN: Home, may need SNF if PT slow. DISCHARGE NEEDS: HHPT, CPM, Walker and 3-in-1 comode seat     Artice Bergerson J 10/13/2015, 7:08 AM

## 2015-10-13 NOTE — Progress Notes (Signed)
Physical Therapy Treatment Note  Clinical Impression:  Still very painful with mobility, and knee ROM, but very willing to work; Moving slowly, but overall steady in RW;   On track for dc home -- would like to see more efficient gait and needs stair training;     10/13/15 1444  PT Visit Information  Last PT Received On 10/13/15  Assistance Needed +1  PT/OT/SLP Co-Evaluation/Treatment Yes  Reason for Co-Treatment For patient/therapist safety  PT goals addressed during session Mobility/safety with mobility  History of Present Illness s/p Rt TKR  PMHx-Lt TKR '09,   Subjective Data  Patient Stated Goal decrease pain  Precautions  Precautions Knee  Restrictions  RLE Weight Bearing WBAT  Pain Assessment  Pain Assessment Faces  Faces Pain Scale 8  Pain Location R knee   Pain Descriptors / Indicators Aching  Pain Intervention(s) Limited activity within patient's tolerance  Cognition  Arousal/Alertness Awake/alert  Behavior During Therapy WFL for tasks assessed/performed  Overall Cognitive Status Within Functional Limits for tasks assessed  Bed Mobility  Overal bed mobility Needs Assistance  Bed Mobility Sit to Supine  Sit to supine Min assist  General bed mobility comments Min assist for guiding RLE into bed  Transfers  Overall transfer level Needs assistance  Equipment used Rolling walker (2 wheeled)  Transfers Sit to/from Stand  Sit to Stand Min guard  General transfer comment Min guard for safety. VC for hand placement and technique. Sit to stand from chair x 1, BSC over toilet x 1  Ambulation/Gait  Ambulation/Gait assistance Min guard  Ambulation Distance (Feet) 20 Feet  Assistive device Rolling walker (2 wheeled)  Gait Pattern/deviations Step-to pattern;Decreased step length - left;Decreased stance time - right;Trunk flexed  General Gait Details Pt required min guard during gait training for safety. Verbal cuing to open eyes during gait training. Pt was able to WB  through RLE even though she was in a significant amount of pain.  Gait velocity decreased  Balance  Standing balance-Leahy Scale Poor  Exercises  Exercises Total Joint;Other exercises  Total Joint Exercises  Quad Sets AROM;Right;10 reps  Short Arc [employer];Left;5 reps  Straight Leg Raises AAROM;Left;5 reps  Long Arc [place];Right;10 reps  Knee Flexion AAROM;Left;10 reps;Seated (very painful)  Goniometric ROM 0-50 approximately; limited by pain, grimace, arching  PT - End of Session  Equipment Utilized During Treatment Gait belt  Activity Tolerance Patient limited by pain;Patient limited by lethargy  Patient left in bed;with call bell/phone within reach;with family/visitor present (in zero knee bone foam)  Nurse Communication Mobility status  PT - Assessment/Plan  PT Plan Current plan remains appropriate  PT Frequency (ACUTE ONLY) 7X/week  Follow Up Recommendations Home health PT;Supervision for mobility/OOB  PT equipment Rolling walker with 5" wheels;3in1 (PT)  PT Goal Progression  Progress towards PT goals Progressing toward goals  Acute Rehab PT Goals  PT Goal Formulation With family  Time For Goal Achievement 10/15/15  Potential to Achieve Goals Good  PT General Charges  $$ ACUTE PT VISIT 1 Procedure  PT Treatments  $Gait Training 8-22 mins   Roney Marion, [place]  Acute Rehabilitation Services Pager [phone] Office [phone]

## 2015-10-13 NOTE — Progress Notes (Signed)
Orthopedic Tech Progress Note Patient Details:  Lisa Mcconnell [birth year] [id number]  Patient ID: [name], female   DOB: February 23, 1956, 59 y.o.   MRN: [id number] Placed pt's rle on cpm @ 0-20 degrees @1435   Hildred Priest 10/13/2015, 2:36 PM

## 2015-10-13 NOTE — Clinical Social Work Note (Signed)
CSW received referral for SNF.  Case discussed with case manage and plan is to discharge home.  CSW to sign off please re-consult if social work needs arise.  Jones Broom. [place], MSW, [place]

## 2015-10-13 NOTE — Progress Notes (Signed)
Pt states that pain level continues to be at 10/10 with no relief. However when you enter the room pt is asleep will continue to assess. Arthor Captain LPN

## 2015-10-13 NOTE — Evaluation (Signed)
Occupational Therapy Evaluation Patient Details Name: Lisa Mcconnell MRN: [id number] DOB: [birth year] Today's Date: 10/13/2015    History of Present Illness s/p Rt TKR  PMHx-Lt TKR '09,    Clinical Impression   Pt reports she was independent with ADLs PTA. Currently pt is min guard for ADLs and mobility with the exception of mod assist for LB ADLs. Began ADL and safety education with pt and daughter; they verbalized understanding. Pt planning to d/c home with 24/7 supervision from family. Recommending HHOT for further rehab to maximize independence and safety with ADLs and mobility. Pt would benefit from skilled OT in order to increase independence with tub transfers using a 3 in 1.     Follow Up Recommendations  Home health OT;Supervision/Assistance - 24 hour    Equipment Recommendations  3 in 1 bedside comode    Recommendations for Other Services       Precautions / Restrictions Precautions Precautions: Knee Restrictions Weight Bearing Restrictions: Yes RLE Weight Bearing: Weight bearing as tolerated      Mobility Bed Mobility Overal bed mobility: Needs Assistance Bed Mobility: Sit to Supine       Sit to supine: Min assist   General bed mobility comments: Min assist for guiding RLE into bed  Transfers Overall transfer level: Needs assistance Equipment used: Rolling walker (2 wheeled) Transfers: Sit to/from Stand Sit to Stand: Min guard         General transfer comment: Min guard for safety. VC for hand placement and technique. Sit to stand from chair x 1, BSC over toilet x 1    Balance Overall balance assessment: Needs assistance Sitting-balance support: Feet supported;Bilateral upper extremity supported Sitting balance-Leahy Scale: Good     Standing balance support: No upper extremity supported;During functional activity Standing balance-Leahy Scale: Poor Standing balance comment: Pt able to stand at sink and wash hands with no UE support                             ADL Overall ADL's : Needs assistance/impaired Eating/Feeding: Set up;Sitting   Grooming: Wash/dry hands;Min guard;Standing       Lower Body Bathing: Moderate assistance;Sit to/from stand   Upper Body Dressing : Set up;Sitting Upper Body Dressing Details (indicate cue type and reason): Pt able to don hospital gown in sitting with set up Lower Body Dressing: Moderate assistance;Sit to/from stand Lower Body Dressing Details (indicate cue type and reason): Pt unable to reach bilateral feet at this time. Educated pt and daughter on compensatory strategies for LB ADLs; pt verbalized understanding. Toilet Transfer: Min guard;Ambulation;BSC;RW (BSC over toilet, increased time required) Toilet Transfer Details (indicate cue type and reason): Educated on use of 3 in 1 over toilet Toileting- Clothing Manipulation and Hygiene: Minimal assistance;Sit to/from stand       Functional mobility during ADLs: Min guard;Rolling walker General ADL Comments: Pts daughter present for OT eval. Educated pt on home safety, need for close supervision during ADLs and mobility, edema management techniques; pt verbalized understanding.     Vision     Perception     Praxis      Pertinent Vitals/Pain Pain Assessment: Faces Faces Pain Scale: Hurts whole lot Pain Location: R knee Pain Descriptors / Indicators: Grimacing;Guarding;Aching;Operative site guarding Pain Intervention(s): Limited activity within patient's tolerance;Monitored during session;Repositioned;Ice applied     Hand Dominance     Extremity/Trunk Assessment Upper Extremity Assessment Upper Extremity Assessment: Overall WFL for tasks assessed  Lower Extremity Assessment Lower Extremity Assessment: Defer to PT evaluation   Cervical / Trunk Assessment Cervical / Trunk Assessment: Other exceptions Cervical / Trunk Exceptions: obese    Communication Communication Communication: No difficulties   Cognition  Arousal/Alertness: Awake/alert Behavior During Therapy: WFL for tasks assessed/performed;Flat affect Overall Cognitive Status: Within Functional Limits for tasks assessed                     General Comments       Exercises       Shoulder Instructions      Home Living Family/patient expects to be discharged to:: Private residence Living Arrangements: Spouse/significant other Available Help at Discharge: Family;Available 24 hours/day Type of Home: House Home Access: Stairs to enter [employer] of Steps: 3 Entrance Stairs-Rails: Right;Left Home Layout: One level     Bathroom Shower/Tub: Teacher, early years/pre: Standard Bathroom Accessibility: Yes How Accessible: Accessible via walker Home Equipment: Walker - 2 wheels          Prior Functioning/Environment Level of Independence: Independent             OT Diagnosis: Generalized weakness;Acute pain   OT Problem List: Decreased activity tolerance;Impaired balance (sitting and/or standing);Decreased safety awareness;Decreased knowledge of use of DME or AE;Decreased knowledge of precautions;Obesity;Pain   OT Treatment/Interventions: Self-care/ADL training;DME and/or AE instruction;Patient/family education    OT Goals(Current goals can be found in the care plan section) Acute Rehab OT Goals Patient Stated Goal: decrease pain OT Goal Formulation: With patient Time For Goal Achievement: 10/27/15 Potential to Achieve Goals: Good ADL Goals Pt Will Perform Tub/Shower Transfer: with supervision;ambulating;3 in 1;rolling walker;Tub transfer  OT Frequency: Min 2X/week   Barriers to D/C:            Co-evaluation PT/OT/SLP Co-Evaluation/Treatment: Yes Reason for Co-Treatment: For patient/therapist safety   OT goals addressed during session: ADL's and self-care      End of Session Equipment Utilized During Treatment: Gait belt;Rolling walker CPM Right Knee CPM Right Knee: Off Nurse  Communication: Mobility status;Weight bearing status  Activity Tolerance: Patient limited by pain Patient left: in bed;with call bell/phone within reach;with family/visitor present (zero degree bone foam applied to RLE)   Time: [id number] OT Time Calculation (min): 28 min Charges:  OT General Charges $OT Visit: 1 Procedure OT Evaluation $Initial OT Evaluation Tier I: 1 Procedure G-Codes:     Binnie Kand M.S., OTR/L [phone]  10/13/2015, 2:42 PM

## 2015-10-13 NOTE — Progress Notes (Signed)
Around 2230 last night I went in to speak with patient and her husband in regards to pain management.  The patient stated that she was promised a PCA after surgery.  The doctor did not put in an order for PCA.  Patient received Dilaudid iv around 2200 and was sleeping during the conversation.  The ultimate agreement was to get her back on Oxy IR PO q 3 hours as per her husband since patient was in/out of sleep.  Husband stated that the Oxycodone was helpful and she was off of schedule since day shift.  Informed evening nurse of this agreement.

## 2015-10-13 NOTE — Progress Notes (Signed)
Orthopedic Tech Progress Note Patient Details:  Lisa Mcconnell 04/29/1956 [id number] Off cpm at 1915 Patient ID: [name], female   DOB: [birth year], 59 y.o.   MRN: [id number]   [name] 10/13/2015, 7:14 PM

## 2015-10-14 LAB — CBC
HEMATOCRIT: 31.7 % — AB (ref 36.0–46.0)
HEMOGLOBIN: 10.4 g/dL — AB (ref 12.0–15.0)
MCH: 31.3 pg (ref 26.0–34.0)
MCHC: 32.8 g/dL (ref 30.0–36.0)
MCV: 95.5 fL (ref 78.0–100.0)
Platelets: 183 10*3/uL (ref 150–400)
RBC: 3.32 MIL/uL — ABNORMAL LOW (ref 3.87–5.11)
RDW: 13.4 % (ref 11.5–15.5)
WBC: 14 10*3/uL — AB (ref 4.0–10.5)

## 2015-10-14 NOTE — Progress Notes (Signed)
Physical Therapy Treatment Patient Details Name: Lisa Mcconnell MRN: [id number] DOB: 1956-07-23 Today's Date: 10/14/2015    History of Present Illness s/p Rt TKR  PMHx-Lt TKR '09,     PT Comments    Pt was in chair upon entering room for treatment. Pt had complaints of pain, however, looked better than yesterday. Pt's goniometry measurements may be limited due to pt's pain/swelling. During exercises, nurse came in and stated the pt had orders for a doppler due to pt having increased pain in calf. SPTA did not get patient out of chair, and will hold afternoon treatment until doppler results are in.   Follow Up Recommendations  Home health PT;Supervision for mobility/OOB     Equipment Recommendations  Rolling walker with 5" wheels;3in1 (PT)       Precautions / Restrictions Precautions Precautions: Knee Restrictions Weight Bearing Restrictions: Yes RLE Weight Bearing: Weight bearing as tolerated          Cognition Arousal/Alertness: Awake/alert Behavior During Therapy: WFL for tasks assessed/performed Overall Cognitive Status: Within Functional Limits for tasks assessed                      Exercises Total Joint Exercises Quad Sets: AROM;Right;10 reps Short Arc Quad: AAROM;10 reps;Right Heel Slides: AROM;AAROM;Right;10 reps Straight Leg Raises: AAROM;Right;10 reps Goniometric ROM: 18-35 degrees of knee flexion.        Pertinent Vitals/Pain Pain Assessment: 0-10 Pain Score: 8  Pain Location: Right knee Pain Descriptors / Indicators: Aching;Grimacing;Guarding Pain Intervention(s): Monitored during session;RN gave pain meds during session              Frequency  7X/week    PT Plan Current plan remains appropriate       End of Session   Activity Tolerance: Patient limited by pain Patient left: in chair;with call bell/phone within reach;with family/visitor present     Time: [id number] PT Time Calculation (min) (ACUTE ONLY): 11 min  Charges:    [place], [place] OFFICE  10/14/2015, 10:30 AM

## 2015-10-14 NOTE — Progress Notes (Signed)
PATIENT ID: Lisa Mcconnell  MRN: [id number]  DOB/AGE:  [birth year] / 59 y.o.  2 Days Post-Op Procedure(s) (LRB): TOTAL KNEE ARTHROPLASTY (Right)    PROGRESS NOTE Subjective: Patient is alert, oriented, no Nausea, no Vomiting, yes passing gas. Taking PO well with small bites. Denies SOB, Chest or Calf Pain. Using Incentive Spirometer, PAS in place. Ambulate WBAT with pt walking 30 ft with therapy, CPM 0-40 Patient reports pain as 10/10 .    Objective: Vital signs in last 24 hours: Filed Vitals:   10/13/15 1600 10/13/15 2145 10/14/15 0038 10/14/15 0531  BP: 155/45 131/56 144/60 129/54  Pulse: 81 95 98 98  Temp: 98.4 F (36.9 C) 99.8 F (37.7 C) 100 F (37.8 C) 99.1 F (37.3 C)  TempSrc: Oral Oral Oral Oral  Resp: 17  16 16   Height:      Weight:      SpO2:  92% 97% 92%      Intake/Output from previous day: I/O last 3 completed shifts: In: 2377.1 [P.O.:600; I.V.:1777.1] Out: -    Intake/Output this shift:     LABORATORY DATA:  Recent Labs  10/13/15 0650 10/13/15 0943 10/14/15 0427  WBC  --  12.8* 14.0*  HGB  --  11.5* 10.4*  HCT  --  34.7* 31.7*  PLT  --  204 183  NA 135  --   --   K 3.9  --   --   CL 100*  --   --   CO2 25  --   --   BUN 7  --   --   CREATININE 0.73  --   --   GLUCOSE 142*  --   --   CALCIUM 9.2  --   --     Examination: Neurologically intact Neurovascular intact Sensation intact distally Intact pulses distally Dorsiflexion/Plantar flexion intact Incision: dressing C/D/I No cellulitis present pt does have increast right calf tenderness}  Assessment:   2 Days Post-Op Procedure(s) (LRB): TOTAL KNEE ARTHROPLASTY (Right) ADDITIONAL DIAGNOSIS: Expected Acute Blood Loss Anemia, Hypertension Right   Plan: PT/OT WBAT, CPM 5/hrs day until ROM 0-90 degrees, then D/C CPM DVT Prophylaxis:  SCDx72hrs, ASA 325 mg BID x 2 weeks DISCHARGE PLAN: Home, though pt may need SNF DISCHARGE NEEDS: HHPT, CPM, Walker and 3-in-1 comode seat      Shelvia Fojtik R 10/14/2015, 8:11 AM

## 2015-10-14 NOTE — Progress Notes (Signed)
Occupational Therapy Treatment Patient Details Name: Lisa Mcconnell MRN: [id number] DOB: May 15, 1956 Today's Date: 10/14/2015    History of present illness s/p Rt TKR  PMHx-Lt TKR '09,    OT comments  Pt. Seen for skilled OT. Pt. Has strong family support from spouse and dtr. Who is a CNA.  Family already ambulating In room with pt. With their own gait belt.  Declining multiple attempts at goal and ADL review as they will be her main assistance.  Report she will be sponge bathing initially, declining tub transfer.  Also report their are no concerns with toileiting A or LB ADLS.  They have no acute OT needs at this time.  They verbalize understanding with this.  Will alert OTR/L to sign off.    Follow Up Recommendations  Home health OT;Supervision/Assistance - 24 hour    Equipment Recommendations  3 in 1 bedside comode    Recommendations for Other Services      Precautions / Restrictions Precautions Precautions: Knee Restrictions Weight Bearing Restrictions: Yes RLE Weight Bearing: Weight bearing as tolerated       Mobility Bed Mobility               General bed mobility comments: pt. amb. in room with family upon arrival  Transfers Overall transfer level: Needs assistance Equipment used: Rolling walker (2 wheeled) Transfers: Sit to/from [facility] Sit to Stand: Min guard Stand pivot transfers: Min guard       General transfer comment: husband able to instruct pt. on proper alignment and hand placement prior to sitting down.  proper use of gait belt also    Balance                                   ADL Overall ADL's : Needs assistance/impaired               Lower Body Bathing Details (indicate cue type and reason): declines need for review, will have family assist   Upper Body Dressing Details (indicate cue type and reason): family assisting   Lower Body Dressing Details (indicate cue type and reason): declines need for  review, will have family assist  Toilet Transfer: Min guard;Ambulation;BSC;RW Toilet Transfer Details (indicate cue type and reason): sim. amb. from right side of bed around the foot of the bed to the recliner.  declined need to amb. to b.room for actual use/demo Toileting- Clothing Manipulation and Hygiene: Minimal assistance;Sit to/from stand Toileting - Clothing Manipulation Details (indicate cue type and reason): sim. during transfer   Tub/Shower Transfer Details (indicate cue type and reason): pt. declined this goal stating "oh we aint gonna be doing all that, im doing a sit bath" declines tub transfer and will be sponge bathing initially Functional mobility during ADLs: Min guard;Rolling walker General ADL Comments: dtr. and spouse present.  actively amb. pt. upon arrival to room with their own personal gait belt.  all family members and pt. declined all goals and demo of adls this session.  state she amb. to/from b.room with assist, state they use bsc at night.  declined need for tub transfer practice due to songe bathe initially.  family very active in pts. care. dtr. is a cna and was helping throughout entire session.  very guarded and operate without wanting assistance or input from therapist.  spouse applying lotion to pts arms and rubbing it in during end of session.  pt.  will have very limited need for independence due to family will be assisting her with everything.  reviewed our OT goals/role and they state they have no further needs.  will sign off      Vision                     Perception     Praxis      Cognition   Behavior During Therapy: WFL for tasks assessed/performed Overall Cognitive Status: Within Functional Limits for tasks assessed                       Extremity/Trunk Assessment               Exercises     Shoulder Instructions       General Comments      Pertinent Vitals/ Pain       Pain Assessment: 0-10 Pain Score: 10-Worst  pain ever Pain Location: R knee Pain Descriptors / Indicators: Aching Pain Intervention(s): Limited activity within patient's tolerance;Premedicated before session;Repositioned  Home Living                                          Prior Functioning/Environment              Frequency Min 2X/week     Progress Toward Goals  OT Goals(current goals can now be found in the care plan section)  Progress towards OT goals: Goals met/education completed, patient discharged from [place] Discharge plan remains appropriate    Co-evaluation                 End of Session Equipment Utilized During Treatment: Gait belt;Rolling walker   Activity Tolerance Patient tolerated treatment well   Patient Left in chair;with call bell/phone within reach;with family/visitor present   Nurse Communication          Time: 6415-8309 OT Time Calculation (min): 21 min  Charges: OT General Charges $OT Visit: 1 Procedure OT Treatments $Self Care/Home Management : 8-22 mins  Janice Coffin, COT/L 10/14/2015, 9:11 AM

## 2015-10-14 NOTE — Plan of Care (Signed)
Problem: Acute Rehab OT Goals (only OT should resolve) Goal: Pt. Will Perform Tub/Shower Transfer Outcome: Not Met (add Reason) Pt. And family declined introduction of this transfer/goal stating they will sponge bathe initially.

## 2015-10-15 ENCOUNTER — Inpatient Hospital Stay (HOSPITAL_COMMUNITY): Payer: 59

## 2015-10-15 DIAGNOSIS — M79661 Pain in right lower leg: Secondary | ICD-10-CM

## 2015-10-15 LAB — CBC
HEMATOCRIT: 30.4 % — AB (ref 36.0–46.0)
HEMOGLOBIN: 9.7 g/dL — AB (ref 12.0–15.0)
MCH: 30.8 pg (ref 26.0–34.0)
MCHC: 31.9 g/dL (ref 30.0–36.0)
MCV: 96.5 fL (ref 78.0–100.0)
Platelets: 162 10*3/uL (ref 150–400)
RBC: 3.15 MIL/uL — ABNORMAL LOW (ref 3.87–5.11)
RDW: 13.5 % (ref 11.5–15.5)
WBC: 12.6 10*3/uL — AB (ref 4.0–10.5)

## 2015-10-15 NOTE — Progress Notes (Signed)
Pt scheduled for doppler of RLE to r/o dvt. Pt has not been down for procedure yet. Vascular lab paged and they close at 1900. Will report off to on coming day RN to call vascular and follow up with them.

## 2015-10-15 NOTE — Progress Notes (Signed)
Orthopedic Tech Progress Note Patient Details:  Lisa Mcconnell [birth year] [id number] On cpm at 1905 Patient ID: Lisa Mcconnell, female   DOB: [birth year], 59 y.o.   MRN: [id number]   Lisa Mcconnell 10/15/2015, 7:03 PM

## 2015-10-15 NOTE — Progress Notes (Addendum)
Physical Therapy Treatment Patient Details Name: Lisa Mcconnell MRN: [id number] DOB: 10-13-1956 Today's Date: 10/15/2015    History of Present Illness 59 y.o. female admitted to [facility] on 10/12/15 for elective R TKA.  Pt with significant PMHx of L TKA (2009), HTN, obesity, chronic constipation, and anemia.    PT Comments    Pt is POD #3 and is progressing slowly, but daily.  She was able to walk into the hallway with RW and min guard assist.  She reports the plan is to go home tomorrow.  LE dopplers were negative, so we can proceed normally with therapy this PM.  Goal update completed today.   Follow Up Recommendations  Home health PT;Supervision for mobility/OOB     Equipment Recommendations  Rolling walker with 5" wheels;3in1 (PT)    Recommendations for Other Services   NA     Precautions / Restrictions Precautions Precautions: Knee Precaution Booklet Issued: Yes (comment) Precaution Comments: hanout filled out Restrictions RLE Weight Bearing: Weight bearing as tolerated    Mobility  Bed Mobility Overal bed mobility: Needs Assistance Bed Mobility: Supine to Sit;Sit to Supine     Supine to sit: Min assist;HOB elevated Sit to supine: Min assist;HOB elevated   General bed mobility comments: Min assist to help progress right leg over EOB.  Pt using bed rails for leverage at her trunk.   Transfers Overall transfer level: Needs assistance Equipment used: Rolling walker (2 wheeled) Transfers: Sit to/from Stand Sit to Stand: Min guard         General transfer comment: Min guard assist for safety during transitions.  Verbal cues for safe hand placement and slow descent to sit.   Ambulation/Gait Ambulation/Gait assistance: Min guard Ambulation Distance (Feet): 45 Feet Assistive device: Rolling walker (2 wheeled) Gait Pattern/deviations: Step-to pattern;Antalgic;Trunk flexed     General Gait Details: Pt with moderately antalgic gait pattern, verbal cues for upright  posture.  Limited endurance due to arm weakness and leg pain.           Balance Overall balance assessment: Needs assistance Sitting-balance support: Feet supported;No upper extremity supported Sitting balance-Leahy Scale: Good     Standing balance support: Bilateral upper extremity supported Standing balance-Leahy Scale: Poor Standing balance comment: pt needs RW and external assist                     Cognition Arousal/Alertness: Awake/alert Behavior During Therapy: WFL for tasks assessed/performed Overall Cognitive Status: Within Functional Limits for tasks assessed                      Exercises Total Joint Exercises Ankle Circles/Pumps: AROM;Both;20 reps Quad Sets: AROM;Right;10 reps Towel Squeeze: AROM;Both;10 reps Heel Slides: AAROM;Right;10 reps Goniometric ROM: 11-62 AAROM supine in the bed        Pertinent Vitals/Pain Pain Assessment: 0-10 Pain Score: 7  Pain Location: right knee Pain Descriptors / Indicators: Aching Pain Intervention(s): Limited activity within patient's tolerance;Monitored during session;Repositioned           PT Goals (current goals can now be found in the care plan section) Acute Rehab PT Goals Patient Stated Goal: decrease pain PT Goal Formulation: With patient Time For Goal Achievement: 10/22/15 Potential to Achieve Goals: Good Progress towards PT goals: Progressing toward goals (goal update completed- due today)    Frequency  7X/week    PT Plan Current plan remains appropriate       End of Session   Activity Tolerance: Patient  limited by fatigue;Patient limited by pain Patient left: in bed;with call bell/phone within reach     Time: [id number] PT Time Calculation (min) (ACUTE ONLY): 34 min  Charges:  $Gait Training: 8-22 mins $Therapeutic Exercise: 8-22 mins                      Brenon Antosh B. Jamise Pentland, [place], DPT [phone]   10/15/2015, 10:45 AM

## 2015-10-15 NOTE — Progress Notes (Signed)
PT Cancellation Note  Patient Details Name: Lisa Mcconnell MRN: [id number] DOB: [birth year]   Cancelled Treatment:    Reason Eval/Treat Not Completed: Patient at procedure or test/unavailable.  Pt getting [place] and not back in room yet.  PT will come back before lunch.  Thanks,    Barbarann Ehlers. Tatanisha Cuthbert, [place], DPT [phone]   10/15/2015, 8:59 AM

## 2015-10-15 NOTE — Progress Notes (Signed)
Patient ID: Lisa Mcconnell, female   DOB: January 04, 1956, 59 y.o.   MRN: [id number] PATIENT ID: Lisa Mcconnell  MRN: [id number]  DOB/AGE:  [birth year] / 59 y.o.  3 Days Post-Op Procedure(s) (LRB): TOTAL KNEE ARTHROPLASTY (Right)    PROGRESS NOTE Subjective: Patient is alert, oriented, no Nausea, no Vomiting, yes passing gas. Taking PO well. Denies SOB, Chest or Calf Pain. Using Incentive Spirometer, PAS in place. Ambulate 20", CPM 0-60 Patient reports pain as 4/10 .    Objective: Vital signs in last 24 hours: Filed Vitals:   10/14/15 0531 10/14/15 1300 10/14/15 1947 10/15/15 0438  BP: 129/54 124/44 125/53 129/55  Pulse: 98  98 94  Temp: 99.1 F (37.3 C) 99.4 F (37.4 C) 99.5 F (37.5 C) 98.8 F (37.1 C)  TempSrc: Oral  Oral Oral  Resp: 16 14 16 16   Height:      Weight:      SpO2: 92% 96% 92% 95%      Intake/Output from previous day: I/O last 3 completed shifts: In: 1200 [P.O.:1200] Out: -    Intake/Output this shift:     LABORATORY DATA:  Recent Labs  10/13/15 0650  10/14/15 0427 10/15/15 0314  WBC  --   < > 14.0* 12.6*  HGB  --   < > 10.4* 9.7*  HCT  --   < > 31.7* 30.4*  PLT  --   < > 183 162  NA 135  --   --   --   K 3.9  --   --   --   CL 100*  --   --   --   CO2 25  --   --   --   BUN 7  --   --   --   CREATININE 0.73  --   --   --   GLUCOSE 142*  --   --   --   CALCIUM 9.2  --   --   --   < > = values in this interval not displayed.  Examination: Neurologically intact ABD soft Neurovascular intact Sensation intact distally Intact pulses distally Dorsiflexion/Plantar flexion intact Incision: dressing C/D/I No cellulitis present Compartment soft} New light dressing applied  Assessment:   3 Days Post-Op Procedure(s) (LRB): TOTAL KNEE ARTHROPLASTY (Right) ADDITIONAL DIAGNOSIS: Expected Acute Blood Loss Anemia, Hypertension, chronic pain  Plan: PT/OT WBAT, CPM 5/hrs day until ROM 0-90 degrees, then D/C CPM DVT Prophylaxis:  SCDx72hrs, ASA 325 mg  BID x 2 weeks DISCHARGE PLAN: Home, when passes PT DISCHARGE NEEDS: HHPT, HHRN, CPM, Walker and 3-in-1 comode seat     Shahzad Thomann J 10/15/2015, 8:00 AM

## 2015-10-15 NOTE — Progress Notes (Signed)
*  Preliminary Results* Right lower extremity venous duplex completed. Right lower extremity is negative for deep vein thrombosis. There is no evidence of right Baker's cyst.  10/15/2015 8:31 AM  Maudry Mayhew, RVT, RDCS, RDMS

## 2015-10-15 NOTE — Progress Notes (Signed)
Physical Therapy Treatment Patient Details Name: Lisa Mcconnell MRN: [id number] DOB: [birth year] Today's Date: 10/15/2015    History of Present Illness 59 y.o. female admitted to [facility] on 10/12/15 for elective R TKA.  Pt with significant PMHx of L TKA (2009), HTN, obesity, chronic constipation, and anemia.    PT Comments    Session #2 on POD #3.  Pt is moving well and stair training was initiated with daughter present for education and practice.  She would benefit from stair training being reinforced tomorrow as well as reviewing the last three seated exercises in her exercise packet before discharge (planned for) tomorrow.  PT will continue to follow acutely.   Follow Up Recommendations  Home health PT;Supervision for mobility/OOB     Equipment Recommendations  Rolling walker with 5" wheels;3in1 (PT)    Recommendations for Other Services   NA     Precautions / Restrictions Precautions Precautions: Knee Precaution Booklet Issued: Yes (comment) Precaution Comments: knee precaution reviewed Restrictions RLE Weight Bearing: Weight bearing as tolerated    Mobility  Bed Mobility Overal bed mobility: Needs Assistance Bed Mobility: Supine to Sit     Supine to sit: Min assist;HOB elevated Sit to supine: Min assist;HOB elevated   General bed mobility comments: Min assist to help progress right leg to EOB.  Pt using bedrail for leverage at trunk  Transfers Overall transfer level: Needs assistance Equipment used: Rolling walker (2 wheeled) Transfers: Sit to/from Stand Sit to Stand: Min guard         General transfer comment: Min guard assist to help support trunk during transitions for balance and lighthly stabilize RW as pt transitions her hands.   Ambulation/Gait Ambulation/Gait assistance: Min guard Ambulation Distance (Feet): 55 Feet Assistive device: Standard walker Gait Pattern/deviations: Step-to pattern;Antalgic;Trunk flexed     General Gait Details: Verbal cues  for upright posture and initiated heel to toe cues for more normalized gait pattern.  Pt with slow, yet steady gait with RW in hallway.    Stairs Stairs: Yes Stairs assistance: Min assist Stair Management: Two rails;Step to pattern;Forwards Number of Stairs: 5 General stair comments: Pt needed min assist to help support trunk when putting weight through her right leg going up and down the stairs.  Daughter present and assisting with stair training.  Verbal cues given for safest LE sequencing and guarding position.          Balance Overall balance assessment: Needs assistance Sitting-balance support: Feet supported;No upper extremity supported Sitting balance-Leahy Scale: Good     Standing balance support: Bilateral upper extremity supported Standing balance-Leahy Scale: Poor Standing balance comment: pt needs RW and external assist                     Cognition Arousal/Alertness: Awake/alert Behavior During Therapy: WFL for tasks assessed/performed Overall Cognitive Status: Within Functional Limits for tasks assessed                      Exercises Total Joint Exercises Ankle Circles/Pumps: AROM;Both;20 reps Quad Sets: AROM;Right;10 reps Towel Squeeze: AROM;Both;10 reps Short Arc QuadSinclair Ship;Right;10 reps Heel Slides: AAROM;Right;10 reps Hip ABduction/ADduction: AAROM;Right;10 reps Straight Leg Raises: AAROM;Right;10 reps Goniometric ROM: 11-62 AAROM supine in the bed        Pertinent Vitals/Pain Pain Assessment: 0-10 Pain Score: 8  Pain Location: right knee Pain Descriptors / Indicators: Aching Pain Intervention(s): Limited activity within patient's tolerance;Monitored during session;Repositioned  PT Goals (current goals can now be found in the care plan section) Acute Rehab PT Goals Patient Stated Goal: decrease pain PT Goal Formulation: With patient Time For Goal Achievement: 10/22/15 Potential to Achieve Goals: Good Progress  towards PT goals: Progressing toward goals    Frequency  7X/week    PT Plan Current plan remains appropriate       End of Session   Activity Tolerance: Patient limited by fatigue;Patient limited by pain Patient left: in chair;with call bell/phone within reach;with family/visitor present     Time: [id number] PT Time Calculation (min) (ACUTE ONLY): 42 min  Charges:  $Gait Training: 23-37 mins $Therapeutic Exercise: 8-22 mins                      Thijs Brunton B. Rayfield Beem, PT, DPT [phone]   10/15/2015, 2:04 PM

## 2015-10-16 NOTE — Progress Notes (Signed)
PATIENT ID: Lisa Mcconnell  MRN: [id number]  DOB/AGE:  1956-05-12 / 59 y.o.  4 Days Post-Op Procedure(s) (LRB): TOTAL KNEE ARTHROPLASTY (Right)    PROGRESS NOTE Subjective: Patient is alert, oriented, no Nausea, no Vomiting, yes passing gas. Taking PO well. Denies SOB, Chest or Calf Pain. Using Incentive Spirometer, PAS in place. Ambulate WBAT with pt walking 55 ft with therapy, CPM 0-40 Patient reports pain as 7/10 .    Objective: Vital signs in last 24 hours: Filed Vitals:   10/15/15 0438 10/15/15 1500 10/15/15 2054 10/16/15 0501  BP: 129/55 112/47 106/48 116/56  Pulse: 94 92 100 82  Temp: 98.8 F (37.1 C) 97.8 F (36.6 C) 98.8 F (37.1 C) 98.7 F (37.1 C)  TempSrc: Oral  Oral Oral  Resp: 16 18 16 16   Height:      Weight:      SpO2: 95% 95% 95% 92%      Intake/Output from previous day: I/O last 3 completed shifts: In: 720 [P.O.:720] Out: -    Intake/Output this shift:     LABORATORY DATA:  Recent Labs  10/14/15 0427 10/15/15 0314  WBC 14.0* 12.6*  HGB 10.4* 9.7*  HCT 31.7* 30.4*  PLT 183 162    Examination: Neurologically intact Neurovascular intact Sensation intact distally Intact pulses distally Dorsiflexion/Plantar flexion intact Incision: dressing C/D/I No cellulitis present Compartment soft}  Assessment:   4 Days Post-Op Procedure(s) (LRB): TOTAL KNEE ARTHROPLASTY (Right) ADDITIONAL DIAGNOSIS: Expected Acute Blood Loss Anemia, Hypertension, chronic pain, obesity  Plan: PT/OT WBAT, CPM 5/hrs day until ROM 0-90 degrees, then D/C CPM DVT Prophylaxis:  SCDx72hrs, ASA 325 mg BID x 2 weeks DISCHARGE PLAN: Home, once pt passes therapy goals DISCHARGE NEEDS: HHPT, CPM, Walker and 3-in-1 comode seat     Lisa Mcconnell 10/16/2015, 8:42 AM

## 2015-10-16 NOTE — Discharge Summary (Signed)
Patient ID: Lisa Mcconnell MRN: [id number] DOB/AGE: 02/11/1956 59 y.o.  Admit date: 10/12/2015 Discharge date: 10/16/2015  Admission Diagnoses:  Principal Problem:   Primary osteoarthritis of right knee Active Problems:   Arthritis of knee   Discharge Diagnoses:  Same  Past Medical History  Diagnosis Date  . Obesity   . Hypertension   . Chronic constipation   . Hemorrhoids   . ACE-inhibitor cough   . Arthritis   . Anemia     as a child    Surgeries: Procedure(s): TOTAL KNEE ARTHROPLASTY on 10/12/2015   Consultants:    Discharged Condition: Improved  Hospital Course: Lisa Mcconnell is an 59 y.o. female who was admitted 10/12/2015 for operative treatment ofPrimary osteoarthritis of right knee. Patient has severe unremitting pain that affects sleep, daily activities, and work/hobbies. After pre-op clearance the patient was taken to the operating room on 10/12/2015 and underwent  Procedure(s): TOTAL KNEE ARTHROPLASTY.    Patient was given perioperative antibiotics: Anti-infectives    Start     Dose/Rate Route Frequency Ordered Stop   10/12/15 0930  ceFAZolin (ANCEF) 3 g in dextrose 5 % 50 mL IVPB     3 g 160 mL/hr over 30 Minutes Intravenous To [facility]Stay [facility] 10/11/15 1526 10/12/15 0948       Patient was given sequential compression devices, early ambulation, and chemoprophylaxis to prevent DVT.  Patient benefited maximally from hospital stay and there were no complications.    Recent vital signs: Patient Vitals for the past 24 hrs:  BP Temp Temp src Pulse Resp SpO2  10/16/15 0501 (!) 116/56 mmHg 98.7 F (37.1 C) Oral 82 16 92 %  10/15/15 2054 (!) 106/48 mmHg 98.8 F (37.1 C) Oral 100 16 95 %  10/15/15 1500 (!) 112/47 mmHg 97.8 F (36.6 C) - 92 18 95 %     Recent laboratory studies:  Recent Labs  10/14/15 0427 10/15/15 0314  WBC 14.0* 12.6*  HGB 10.4* 9.7*  HCT 31.7* 30.4*  PLT 183 162     Discharge Medications:     Medication List    TAKE  these medications        amLODipine 5 MG tablet  Commonly known as:  NORVASC  TAKE 1 TABLET ONCE DAILY.     aspirin EC 325 MG tablet  Take 1 tablet (325 mg total) by mouth 2 (two) times daily.     celecoxib 100 MG capsule  Commonly known as:  CELEBREX  Take 1 capsule (100 mg total) by mouth 2 (two) times daily.     HYDROcodone-acetaminophen 5-325 MG tablet  Commonly known as:  NORCO/VICODIN  Take 1 tablet by mouth every 6 (six) hours as needed for moderate pain.     losartan-hydrochlorothiazide 100-12.5 MG tablet  Commonly known as:  HYZAAR  TAKE 1 TABLET ONCE DAILY.     oxyCODONE-acetaminophen 5-325 MG tablet  Commonly known as:  PERCOCET/ROXICET  Take 1-2 tablets by mouth every 4 (four) hours as needed for severe pain.     STOOL SOFTENER PO  Take 1 capsule by mouth 4 (four) times daily.     tizanidine 2 MG capsule  Commonly known as:  ZANAFLEX  Take 1 capsule (2 mg total) by mouth 3 (three) times daily.        Diagnostic Studies: No results found.  Disposition: 01-Home or Self Care      Discharge Instructions    CPM    Complete by:  As directed   Continuous passive  motion machine (CPM):      Use the CPM from 0 to 60  for 5 hours per day.      You may increase by 10 degrees per day.  You may break it up into 2 or 3 sessions per day.      Use CPM for 2 weeks or until you are told to stop.     Call MD / Call 911    Complete by:  As directed   If you experience chest pain or shortness of breath, CALL 911 and be transported to the hospital emergency room.  If you develope a fever above 101 F, pus (white drainage) or increased drainage or redness at the wound, or calf pain, call your surgeon's office.     Change dressing    Complete by:  As directed   Change dressing on 5, then change the dressing daily with sterile 4 x 4 inch gauze dressing and apply TED hose.  You may clean the incision with alcohol prior to redressing.     Constipation Prevention    Complete by:   As directed   Drink plenty of fluids.  Prune juice may be helpful.  You may use a stool softener, such as Colace (over the counter) 100 mg twice a day.  Use MiraLax (over the counter) for constipation as needed.     Diet - low sodium heart healthy    Complete by:  As directed      Discharge wound care:    Complete by:  As directed   If you have a hip bandage, keep it clean and dry.  Change your bandage as instructed by your health care providers.  If your bandage has been discontinued, keep your incision clean and dry.  Pat dry after bathing.  DO NOT put lotion or powder on your incision.     Driving restrictions    Complete by:  As directed   No driving for 2 weeks     Increase activity slowly as tolerated    Complete by:  As directed      Patient may shower    Complete by:  As directed   You may shower without a dressing once there is no drainage.  Do not wash over the wound.  If drainage remains, cover wound with plastic wrap and then shower.           Follow-up Information    Follow up with Kerin Salen, MD In 2 weeks.   Specialty:  Orthopedic Surgery   Contact information:   [place] [address] [phone]       Follow up with [facility].   Why:  Someone from [facility] will contact you concerning start date and time for therapy.   Contact information:   [address] [place] [address] [phone]        Signed: Hardin Negus, Suede [place] R 10/16/2015, 8:45 AM

## 2015-10-16 NOTE — Progress Notes (Signed)
Physical Therapy Treatment Patient Details Name: Lisa Mcconnell MRN: [id number] DOB: 1956/09/17 Today's Date: 10/16/2015    History of Present Illness 59 y.o. female admitted to [facility] on 10/12/15 for elective R TKA.  Pt with significant PMHx of L TKA (2009), HTN, obesity, chronic constipation, and anemia.    PT Comments    Pt scheduled for dc today after stair training. Performed with 1 rail and sideways technique, as pt cannot reach both rails at home. MIn A for descent with cues for foot placement.  Husband present and active in her session.  Con't to recommend HHPT.  Follow Up Recommendations  Home health PT;Supervision for mobility/OOB     Equipment Recommendations  Rolling walker with 5" wheels;3in1 (PT)    Recommendations for Other Services       Precautions / Restrictions Precautions Precautions: Knee Precaution Comments: knee precaution reviewed Restrictions RLE Weight Bearing: Weight bearing as tolerated    Mobility  Bed Mobility Overal bed mobility: Needs Assistance Bed Mobility: Supine to Sit     Supine to sit: Min guard     General bed mobility comments: Able to manage leg to side of bed today with min/guard  Transfers Overall transfer level: Needs assistance Equipment used: Rolling walker (2 wheeled) Transfers: Sit to/from Stand Sit to Stand: Min guard Stand pivot transfers: Min guard       General transfer comment: cueing for proper and safe technique  Ambulation/Gait Ambulation/Gait assistance: Min guard Ambulation Distance (Feet): 38 Feet Assistive device: Rolling walker (2 wheeled) Gait Pattern/deviations: Step-to pattern;Trunk flexed Gait velocity: decreased   General Gait Details: Cues for positioning within RW. Pt did not want to walk farther as she wanted to save her energy for getting into the house.   Stairs Stairs: Yes Stairs assistance: Min guard Stair Management: One rail Right;Sideways Number of Stairs: 2 General stair  comments: MIN A for descent with min/gaurd for ascent.  Pt's husband present and feels confident in being able to A her. Pt reports going up sideways prior to surgery due to rails being too far to reach both.  Wheelchair Mobility    Modified Rankin (Stroke Patients Only)       Balance Overall balance assessment: Needs assistance Sitting-balance support: Feet supported Sitting balance-Leahy Scale: Good     Standing balance support: Bilateral upper extremity supported Standing balance-Leahy Scale: Poor Standing balance comment: requires UE support                    Cognition Arousal/Alertness: Awake/alert Behavior During Therapy: WFL for tasks assessed/performed Overall Cognitive Status: Within Functional Limits for tasks assessed                      Exercises Total Joint Exercises Ankle Circles/Pumps: AROM;Both;20 reps Quad Sets: AROM;Right;10 reps Short Arc QuadSinclair Ship;Right;10 reps Heel Slides: AROM;Right;10 reps;Supine Hip ABduction/ADduction: AAROM;Right;10 reps Long Arc Quad: 5 reps;AROM;Seated    General Comments General comments (skin integrity, edema, etc.): Reviewed precautions with pt and spouse      Pertinent Vitals/Pain Pain Assessment: 0-10 Pain Score: 6  Pain Location: R knee Pain Descriptors / Indicators: Aching Pain Intervention(s): Limited activity within patient's tolerance;Monitored during session    Home Living                      Prior Function            PT Goals (current goals can now be found in the care plan  section) Acute Rehab PT Goals Patient Stated Goal: decrease pain PT Goal Formulation: With patient Time For Goal Achievement: 10/22/15 Potential to Achieve Goals: Good Progress towards PT goals: Progressing toward goals    Frequency  7X/week    PT Plan Current plan remains appropriate    Co-evaluation             End of Session Equipment Utilized During Treatment: Gait belt Activity  Tolerance: Patient limited by fatigue Patient left: in chair;with call bell/phone within reach;with family/visitor present     Time: [id number] PT Time Calculation (min) (ACUTE ONLY): 27 min  Charges:  $Gait Training: 8-22 mins $Therapeutic Exercise: 8-22 mins                    G Codes:      Lisa Mcconnell 10/16/2015, 12:49 PM

## 2016-05-14 ENCOUNTER — Other Ambulatory Visit: Payer: Self-pay | Admitting: Internal Medicine

## 2016-05-17 NOTE — Telephone Encounter (Signed)
Rx refill sent to pharmacy. 

## 2016-07-05 ENCOUNTER — Telehealth: Payer: Self-pay | Admitting: Emergency Medicine

## 2016-07-05 NOTE — Telephone Encounter (Signed)
Yes, ok with me 

## 2016-07-05 NOTE — Telephone Encounter (Signed)
Pt came in and stated she got a letter from [facility] that said Dr Shawna Orleans isnt coming back to seek another doctor. Her mother Lisa Mcconnell was a patient of yours and wanted to know if you would except her as a transfer patient?

## 2016-07-05 NOTE — Telephone Encounter (Signed)
Left message on VM for patient to call back and schedule transfer appt with Dr Ronnald Ramp. Thanks.

## 2016-07-07 ENCOUNTER — Other Ambulatory Visit: Payer: Self-pay | Admitting: Internal Medicine

## 2016-07-07 NOTE — Telephone Encounter (Signed)
Pt is transferring to [facility] as PCP. 30 day with no refills until pt can be seen.

## 2016-07-07 NOTE — Telephone Encounter (Signed)
Pt called and asked for a prescription refill on amLODipine (NORVASC) 5 MG tablet and losartan-hydrochlorothiazide (HYZAAR) 100-12.5 MG tablet. Pharmacy is [facility]. Please follow up thanks.

## 2016-07-20 ENCOUNTER — Encounter: Payer: Self-pay | Admitting: Internal Medicine

## 2016-07-20 ENCOUNTER — Ambulatory Visit (INDEPENDENT_AMBULATORY_CARE_PROVIDER_SITE_OTHER): Payer: 59 | Admitting: Internal Medicine

## 2016-07-20 VITALS — BP 142/76 | HR 88 | Temp 98.3°F | Resp 16 | Ht 68.0 in | Wt 259.0 lb

## 2016-07-20 DIAGNOSIS — Z0000 Encounter for general adult medical examination without abnormal findings: Secondary | ICD-10-CM | POA: Diagnosis not present

## 2016-07-20 DIAGNOSIS — M19079 Primary osteoarthritis, unspecified ankle and foot: Secondary | ICD-10-CM

## 2016-07-20 DIAGNOSIS — K5900 Constipation, unspecified: Secondary | ICD-10-CM | POA: Diagnosis not present

## 2016-07-20 DIAGNOSIS — D539 Nutritional anemia, unspecified: Secondary | ICD-10-CM

## 2016-07-20 DIAGNOSIS — I10 Essential (primary) hypertension: Secondary | ICD-10-CM | POA: Diagnosis not present

## 2016-07-20 DIAGNOSIS — Z124 Encounter for screening for malignant neoplasm of cervix: Secondary | ICD-10-CM

## 2016-07-20 DIAGNOSIS — Z1231 Encounter for screening mammogram for malignant neoplasm of breast: Secondary | ICD-10-CM

## 2016-07-20 DIAGNOSIS — M1990 Unspecified osteoarthritis, unspecified site: Secondary | ICD-10-CM | POA: Insufficient documentation

## 2016-07-20 DIAGNOSIS — R739 Hyperglycemia, unspecified: Secondary | ICD-10-CM

## 2016-07-20 MED ORDER — LUBIPROSTONE 24 MCG PO CAPS: 24.0000 ug | ORAL_CAPSULE | Freq: Two times a day (BID) | ORAL | 3 refills | Status: DC

## 2016-07-20 MED ORDER — ETODOLAC ER 500 MG PO TB24: 500.0000 mg | ORAL_TABLET | Freq: Every day | ORAL | 1 refills | Status: DC

## 2016-07-20 NOTE — Patient Instructions (Signed)
Preventive Care for Adults, Female A healthy lifestyle and preventive care can promote health and wellness. Preventive health guidelines for women include the following key practices.  A routine yearly physical is a good way to check with your health care provider about your health and preventive screening. It is a chance to share any concerns and updates on your health and to receive a thorough exam.  Visit your dentist for a routine exam and preventive care every 6 months. Brush your teeth twice a day and floss once a day. Good oral hygiene prevents tooth decay and gum disease.  The frequency of eye exams is based on your age, health, family medical history, use of contact lenses, and other factors. Follow your health care provider's recommendations for frequency of eye exams.  Eat a healthy diet. Foods like vegetables, fruits, whole grains, low-fat dairy products, and lean protein foods contain the nutrients you need without too many calories. Decrease your intake of foods high in solid fats, added sugars, and salt. Eat the right amount of calories for you.Get information about a proper diet from your health care provider, if necessary.  Regular physical exercise is one of the most important things you can do for your health. Most adults should get at least 150 minutes of moderate-intensity exercise (any activity that increases your heart rate and causes you to sweat) each week. In addition, most adults need muscle-strengthening exercises on 2 or more days a week.  Maintain a healthy weight. The body mass index (BMI) is a screening tool to identify possible weight problems. It provides an estimate of body fat based on height and weight. Your health care provider can find your BMI and can help you achieve or maintain a healthy weight.For adults 20 years and older:  A BMI below 18.5 is considered underweight.  A BMI of 18.5 to 24.9 is normal.  A BMI of 25 to 29.9 is considered overweight.  A  BMI of 30 and above is considered obese.  Maintain normal blood lipids and cholesterol levels by exercising and minimizing your intake of saturated fat. Eat a balanced diet with plenty of fruit and vegetables. Blood tests for lipids and cholesterol should begin at age 45 and be repeated every 5 years. If your lipid or cholesterol levels are high, you are over 50, or you are at high risk for heart disease, you may need your cholesterol levels checked more frequently.Ongoing high lipid and cholesterol levels should be treated with medicines if diet and exercise are not working.  If you smoke, find out from your health care provider how to quit. If you do not use tobacco, do not start.  Lung cancer screening is recommended for adults aged 45-80 years who are at high risk for developing lung cancer because of a history of smoking. A yearly low-dose CT scan of the lungs is recommended for people who have at least a 30-pack-year history of smoking and are a current smoker or have quit within the past 15 years. A pack year of smoking is smoking an average of 1 pack of cigarettes a day for 1 year (for example: 1 pack a day for 30 years or 2 packs a day for 15 years). Yearly screening should continue until the smoker has stopped smoking for at least 15 years. Yearly screening should be stopped for people who develop a health problem that would prevent them from having lung cancer treatment.  If you are pregnant, do not drink alcohol. If you are  breastfeeding, be very cautious about drinking alcohol. If you are not pregnant and choose to drink alcohol, do not have more than 1 drink per day. One drink is considered to be 12 ounces (355 mL) of beer, 5 ounces (148 mL) of wine, or 1.5 ounces (44 mL) of liquor.  Avoid use of street drugs. Do not share needles with anyone. Ask for help if you need support or instructions about stopping the use of drugs.  High blood pressure causes heart disease and increases the risk  of stroke. Your blood pressure should be checked at least every 1 to 2 years. Ongoing high blood pressure should be treated with medicines if weight loss and exercise do not work.  If you are 55-79 years old, ask your health care provider if you should take aspirin to prevent strokes.  Diabetes screening is done by taking a blood sample to check your blood glucose level after you have not eaten for a certain period of time (fasting). If you are not overweight and you do not have risk factors for diabetes, you should be screened once every 3 years starting at age 45. If you are overweight or obese and you are 40-70 years of age, you should be screened for diabetes every year as part of your cardiovascular risk assessment.  Breast cancer screening is essential preventive care for women. You should practice "breast self-awareness." This means understanding the normal appearance and feel of your breasts and may include breast self-examination. Any changes detected, no matter how small, should be reported to a health care provider. Women in their 20s and 30s should have a clinical breast exam (CBE) by a health care provider as part of a regular health exam every 1 to 3 years. After age 40, women should have a CBE every year. Starting at age 40, women should consider having a mammogram (breast X-ray test) every year. Women who have a family history of breast cancer should talk to their health care provider about genetic screening. Women at a high risk of breast cancer should talk to their health care providers about having an MRI and a mammogram every year.  Breast cancer gene (BRCA)-related cancer risk assessment is recommended for women who have family members with BRCA-related cancers. BRCA-related cancers include breast, ovarian, tubal, and peritoneal cancers. Having family members with these cancers may be associated with an increased risk for harmful changes (mutations) in the breast cancer genes BRCA1 and  BRCA2. Results of the assessment will determine the need for genetic counseling and BRCA1 and BRCA2 testing.  Your health care provider may recommend that you be screened regularly for cancer of the pelvic organs (ovaries, uterus, and vagina). This screening involves a pelvic examination, including checking for microscopic changes to the surface of your cervix (Pap test). You may be encouraged to have this screening done every 3 years, beginning at age 21.  For women ages 30-65, health care providers may recommend pelvic exams and Pap testing every 3 years, or they may recommend the Pap and pelvic exam, combined with testing for human papilloma virus (HPV), every 5 years. Some types of HPV increase your risk of cervical cancer. Testing for HPV may also be done on women of any age with unclear Pap test results.  Other health care providers may not recommend any screening for nonpregnant women who are considered low risk for pelvic cancer and who do not have symptoms. Ask your health care provider if a screening pelvic exam is right for   you.  If you have had past treatment for cervical cancer or a condition that could lead to cancer, you need Pap tests and screening for cancer for at least 20 years after your treatment. If Pap tests have been discontinued, your risk factors (such as having a new sexual partner) need to be reassessed to determine if screening should resume. Some women have medical problems that increase the chance of getting cervical cancer. In these cases, your health care provider may recommend more frequent screening and Pap tests.  Colorectal cancer can be detected and often prevented. Most routine colorectal cancer screening begins at the age of 50 years and continues through age 75 years. However, your health care provider may recommend screening at an earlier age if you have risk factors for colon cancer. On a yearly basis, your health care provider may provide home test kits to check  for hidden blood in the stool. Use of a small camera at the end of a tube, to directly examine the colon (sigmoidoscopy or colonoscopy), can detect the earliest forms of colorectal cancer. Talk to your health care provider about this at age 50, when routine screening begins. Direct exam of the colon should be repeated every 5-10 years through age 75 years, unless early forms of precancerous polyps or small growths are found.  People who are at an increased risk for hepatitis B should be screened for this virus. You are considered at high risk for hepatitis B if:  You were born in a country where hepatitis B occurs often. Talk with your health care provider about which countries are considered high risk.  Your parents were born in a high-risk country and you have not received a shot to protect against hepatitis B (hepatitis B vaccine).  You have HIV or AIDS.  You use needles to inject street drugs.  You live with, or have sex with, someone who has hepatitis B.  You get hemodialysis treatment.  You take certain medicines for conditions like cancer, organ transplantation, and autoimmune conditions.  Hepatitis C blood testing is recommended for all people born from 1945 through 1965 and any individual with known risks for hepatitis C.  Practice safe sex. Use condoms and avoid high-risk sexual practices to reduce the spread of sexually transmitted infections (STIs). STIs include gonorrhea, chlamydia, syphilis, trichomonas, herpes, HPV, and human immunodeficiency virus (HIV). Herpes, HIV, and HPV are viral illnesses that have no cure. They can result in disability, cancer, and death.  You should be screened for sexually transmitted illnesses (STIs) including gonorrhea and chlamydia if:  You are sexually active and are younger than 24 years.  You are older than 24 years and your health care provider tells you that you are at risk for this type of infection.  Your sexual activity has changed  since you were last screened and you are at an increased risk for chlamydia or gonorrhea. Ask your health care provider if you are at risk.  If you are at risk of being infected with HIV, it is recommended that you take a prescription medicine daily to prevent HIV infection. This is called preexposure prophylaxis (PrEP). You are considered at risk if:  You are sexually active and do not regularly use condoms or know the HIV status of your partner(s).  You take drugs by injection.  You are sexually active with a partner who has HIV.  Talk with your health care provider about whether you are at high risk of being infected with HIV. If   you choose to begin PrEP, you should first be tested for HIV. You should then be tested every 3 months for as long as you are taking PrEP.  Osteoporosis is a disease in which the bones lose minerals and strength with aging. This can result in serious bone fractures or breaks. The risk of osteoporosis can be identified using a bone density scan. Women ages 67 years and over and women at risk for fractures or osteoporosis should discuss screening with their health care providers. Ask your health care provider whether you should take a calcium supplement or vitamin D to reduce the rate of osteoporosis.  Menopause can be associated with physical symptoms and risks. Hormone replacement therapy is available to decrease symptoms and risks. You should talk to your health care provider about whether hormone replacement therapy is right for you.  Use sunscreen. Apply sunscreen liberally and repeatedly throughout the day. You should seek shade when your shadow is shorter than you. Protect yourself by wearing long sleeves, pants, a wide-brimmed hat, and sunglasses year round, whenever you are outdoors.  Once a month, do a whole body skin exam, using a mirror to look at the skin on your back. Tell your health care provider of new moles, moles that have irregular borders, moles that  are larger than a pencil eraser, or moles that have changed in shape or color.  Stay current with required vaccines (immunizations).  Influenza vaccine. All adults should be immunized every year.  Tetanus, diphtheria, and acellular pertussis (Td, Tdap) vaccine. Pregnant women should receive 1 dose of Tdap vaccine during each pregnancy. The dose should be obtained regardless of the length of time since the last dose. Immunization is preferred during the 27th-36th week of gestation. An adult who has not previously received Tdap or who does not know her vaccine status should receive 1 dose of Tdap. This initial dose should be followed by tetanus and diphtheria toxoids (Td) booster doses every 10 years. Adults with an unknown or incomplete history of completing a 3-dose immunization series with Td-containing vaccines should begin or complete a primary immunization series including a Tdap dose. Adults should receive a Td booster every 10 years.  Varicella vaccine. An adult without evidence of immunity to varicella should receive 2 doses or a second dose if she has previously received 1 dose. Pregnant females who do not have evidence of immunity should receive the first dose after pregnancy. This first dose should be obtained before leaving the health care facility. The second dose should be obtained 4-8 weeks after the first dose.  Human papillomavirus (HPV) vaccine. Females aged 13-26 years who have not received the vaccine previously should obtain the 3-dose series. The vaccine is not recommended for use in pregnant females. However, pregnancy testing is not needed before receiving a dose. If a female is found to be pregnant after receiving a dose, no treatment is needed. In that case, the remaining doses should be delayed until after the pregnancy. Immunization is recommended for any person with an immunocompromised condition through the age of 61 years if she did not get any or all doses earlier. During the  3-dose series, the second dose should be obtained 4-8 weeks after the first dose. The third dose should be obtained 24 weeks after the first dose and 16 weeks after the second dose.  Zoster vaccine. One dose is recommended for adults aged 30 years or older unless certain conditions are present.  Measles, mumps, and rubella (MMR) vaccine. Adults born  before 1957 generally are considered immune to measles and mumps. Adults born in 1957 or later should have 1 or more doses of MMR vaccine unless there is a contraindication to the vaccine or there is laboratory evidence of immunity to each of the three diseases. A routine second dose of MMR vaccine should be obtained at least 28 days after the first dose for students attending postsecondary schools, health care workers, or international travelers. People who received inactivated measles vaccine or an unknown type of measles vaccine during 1963-1967 should receive 2 doses of MMR vaccine. People who received inactivated mumps vaccine or an unknown type of mumps vaccine before 1979 and are at high risk for mumps infection should consider immunization with 2 doses of MMR vaccine. For females of childbearing age, rubella immunity should be determined. If there is no evidence of immunity, females who are not pregnant should be vaccinated. If there is no evidence of immunity, females who are pregnant should delay immunization until after pregnancy. Unvaccinated health care workers born before 1957 who lack laboratory evidence of measles, mumps, or rubella immunity or laboratory confirmation of disease should consider measles and mumps immunization with 2 doses of MMR vaccine or rubella immunization with 1 dose of MMR vaccine.  Pneumococcal 13-valent conjugate (PCV13) vaccine. When indicated, a person who is uncertain of his immunization history and has no record of immunization should receive the PCV13 vaccine. All adults 65 years of age and older should receive this  vaccine. An adult aged 19 years or older who has certain medical conditions and has not been previously immunized should receive 1 dose of PCV13 vaccine. This PCV13 should be followed with a dose of pneumococcal polysaccharide (PPSV23) vaccine. Adults who are at high risk for pneumococcal disease should obtain the PPSV23 vaccine at least 8 weeks after the dose of PCV13 vaccine. Adults older than 59 years of age who have normal immune system function should obtain the PPSV23 vaccine dose at least 1 year after the dose of PCV13 vaccine.  Pneumococcal polysaccharide (PPSV23) vaccine. When PCV13 is also indicated, PCV13 should be obtained first. All adults aged 65 years and older should be immunized. An adult younger than age 65 years who has certain medical conditions should be immunized. Any person who resides in a nursing home or long-term care facility should be immunized. An adult smoker should be immunized. People with an immunocompromised condition and certain other conditions should receive both PCV13 and PPSV23 vaccines. People with human immunodeficiency virus (HIV) infection should be immunized as soon as possible after diagnosis. Immunization during chemotherapy or radiation therapy should be avoided. Routine use of PPSV23 vaccine is not recommended for American Indians, [place] Natives, or people younger than 65 years unless there are medical conditions that require PPSV23 vaccine. When indicated, people who have unknown immunization and have no record of immunization should receive PPSV23 vaccine. One-time revaccination 5 years after the first dose of PPSV23 is recommended for people aged 19-64 years who have chronic kidney failure, nephrotic syndrome, asplenia, or immunocompromised conditions. People who received 1-2 doses of PPSV23 before age 65 years should receive another dose of PPSV23 vaccine at age 65 years or later if at least 5 years have passed since the previous dose. Doses of PPSV23 are not  needed for people immunized with PPSV23 at or after age 65 years.  Meningococcal vaccine. Adults with asplenia or persistent complement component deficiencies should receive 2 doses of quadrivalent meningococcal conjugate (MenACWY-D) vaccine. The doses should be obtained   at least 2 months apart. Microbiologists working with certain meningococcal bacteria, [place] recruits, people at risk during an outbreak, and people who travel to or live in countries with a high rate of meningitis should be immunized. A first-year college student up through age 34 years who is living in a residence hall should receive a dose if she did not receive a dose on or after her 16th birthday. Adults who have certain high-risk conditions should receive one or more doses of vaccine.  Hepatitis A vaccine. Adults who wish to be protected from this disease, have certain high-risk conditions, work with hepatitis A-infected animals, work in hepatitis A research labs, or travel to or work in countries with a high rate of hepatitis A should be immunized. Adults who were previously unvaccinated and who anticipate close contact with an international adoptee during the first 60 days after arrival in the [place] from a country with a high rate of hepatitis A should be immunized.  Hepatitis B vaccine. Adults who wish to be protected from this disease, have certain high-risk conditions, may be exposed to blood or other infectious body fluids, are household contacts or sex partners of hepatitis B positive people, are clients or workers in certain care facilities, or travel to or work in countries with a high rate of hepatitis B should be immunized.  Haemophilus influenzae type b (Hib) vaccine. A previously unvaccinated person with asplenia or sickle cell disease or having a scheduled splenectomy should receive 1 dose of Hib vaccine. Regardless of previous immunization, a recipient of a hematopoietic stem cell transplant should receive a  3-dose series 6-12 months after her successful transplant. Hib vaccine is not recommended for adults with HIV infection. Preventive Services / Frequency Ages 35 to 4 years  Blood pressure check.** / Every 3-5 years.  Lipid and cholesterol check.** / Every 5 years beginning at age 60.  Clinical breast exam.** / Every 3 years for women in their 71s and 10s.  BRCA-related cancer risk assessment.** / For women who have family members with a BRCA-related cancer (breast, ovarian, tubal, or peritoneal cancers).  Pap test.** / Every 2 years from ages 76 through 26. Every 3 years starting at age 61 through age 76 or 93 with a history of 3 consecutive normal Pap tests.  HPV screening.** / Every 3 years from ages 37 through ages 60 to 51 with a history of 3 consecutive normal Pap tests.  Hepatitis C blood test.** / For any individual with known risks for hepatitis C.  Skin self-exam. / Monthly.  Influenza vaccine. / Every year.  Tetanus, diphtheria, and acellular pertussis (Tdap, Td) vaccine.** / Consult your health care provider. Pregnant women should receive 1 dose of Tdap vaccine during each pregnancy. 1 dose of Td every 10 years.  Varicella vaccine.** / Consult your health care provider. Pregnant females who do not have evidence of immunity should receive the first dose after pregnancy.  HPV vaccine. / 3 doses over 6 months, if 93 and younger. The vaccine is not recommended for use in pregnant females. However, pregnancy testing is not needed before receiving a dose.  Measles, mumps, rubella (MMR) vaccine.** / You need at least 1 dose of MMR if you were born in 1957 or later. You may also need a 2nd dose. For females of childbearing age, rubella immunity should be determined. If there is no evidence of immunity, females who are not pregnant should be vaccinated. If there is no evidence of immunity, females who are  pregnant should delay immunization until after pregnancy.  Pneumococcal  13-valent conjugate (PCV13) vaccine.** / Consult your health care provider.  Pneumococcal polysaccharide (PPSV23) vaccine.** / 1 to 2 doses if you smoke cigarettes or if you have certain conditions.  Meningococcal vaccine.** / 1 dose if you are age 68 to 8 years and a Market researcher living in a residence hall, or have one of several medical conditions, you need to get vaccinated against meningococcal disease. You may also need additional booster doses.  Hepatitis A vaccine.** / Consult your health care provider.  Hepatitis B vaccine.** / Consult your health care provider.  Haemophilus influenzae type b (Hib) vaccine.** / Consult your health care provider. Ages 7 to 53 years  Blood pressure check.** / Every year.  Lipid and cholesterol check.** / Every 5 years beginning at age 25 years.  Lung cancer screening. / Every year if you are aged 11-80 years and have a 30-pack-year history of smoking and currently smoke or have quit within the past 15 years. Yearly screening is stopped once you have quit smoking for at least 15 years or develop a health problem that would prevent you from having lung cancer treatment.  Clinical breast exam.** / Every year after age 48 years.  BRCA-related cancer risk assessment.** / For women who have family members with a BRCA-related cancer (breast, ovarian, tubal, or peritoneal cancers).  Mammogram.** / Every year beginning at age 41 years and continuing for as long as you are in good health. Consult with your health care provider.  Pap test.** / Every 3 years starting at age 65 years through age 37 or 70 years with a history of 3 consecutive normal Pap tests.  HPV screening.** / Every 3 years from ages 72 years through ages 60 to 40 years with a history of 3 consecutive normal Pap tests.  Fecal occult blood test (FOBT) of stool. / Every year beginning at age 21 years and continuing until age 5 years. You may not need to do this test if you get  a colonoscopy every 10 years.  Flexible sigmoidoscopy or colonoscopy.** / Every 5 years for a flexible sigmoidoscopy or every 10 years for a colonoscopy beginning at age 35 years and continuing until age 48 years.  Hepatitis C blood test.** / For all people born from [birth year] through 1965 and any individual with known risks for hepatitis C.  Skin self-exam. / Monthly.  Influenza vaccine. / Every year.  Tetanus, diphtheria, and acellular pertussis (Tdap/Td) vaccine.** / Consult your health care provider. Pregnant women should receive 1 dose of Tdap vaccine during each pregnancy. 1 dose of Td every 10 years.  Varicella vaccine.** / Consult your health care provider. Pregnant females who do not have evidence of immunity should receive the first dose after pregnancy.  Zoster vaccine.** / 1 dose for adults aged 30 years or older.  Measles, mumps, rubella (MMR) vaccine.** / You need at least 1 dose of MMR if you were born in 1956 or later. You may also need a second dose. For females of childbearing age, rubella immunity should be determined. If there is no evidence of immunity, females who are not pregnant should be vaccinated. If there is no evidence of immunity, females who are pregnant should delay immunization until after pregnancy.  Pneumococcal 13-valent conjugate (PCV13) vaccine.** / Consult your health care provider.  Pneumococcal polysaccharide (PPSV23) vaccine.** / 1 to 2 doses if you smoke cigarettes or if you have certain conditions.  Meningococcal vaccine.** /  Consult your health care provider.  Hepatitis A vaccine.** / Consult your health care provider.  Hepatitis B vaccine.** / Consult your health care provider.  Haemophilus influenzae type b (Hib) vaccine.** / Consult your health care provider. Ages 64 years and over  Blood pressure check.** / Every year.  Lipid and cholesterol check.** / Every 5 years beginning at age 23 years.  Lung cancer screening. / Every year if you  are aged 16-80 years and have a 30-pack-year history of smoking and currently smoke or have quit within the past 15 years. Yearly screening is stopped once you have quit smoking for at least 15 years or develop a health problem that would prevent you from having lung cancer treatment.  Clinical breast exam.** / Every year after age 74 years.  BRCA-related cancer risk assessment.** / For women who have family members with a BRCA-related cancer (breast, ovarian, tubal, or peritoneal cancers).  Mammogram.** / Every year beginning at age 44 years and continuing for as long as you are in good health. Consult with your health care provider.  Pap test.** / Every 3 years starting at age 58 years through age 22 or 39 years with 3 consecutive normal Pap tests. Testing can be stopped between 65 and 70 years with 3 consecutive normal Pap tests and no abnormal Pap or HPV tests in the past 10 years.  HPV screening.** / Every 3 years from ages 64 years through ages 70 or 61 years with a history of 3 consecutive normal Pap tests. Testing can be stopped between 65 and 70 years with 3 consecutive normal Pap tests and no abnormal Pap or HPV tests in the past 10 years.  Fecal occult blood test (FOBT) of stool. / Every year beginning at age 40 years and continuing until age 27 years. You may not need to do this test if you get a colonoscopy every 10 years.  Flexible sigmoidoscopy or colonoscopy.** / Every 5 years for a flexible sigmoidoscopy or every 10 years for a colonoscopy beginning at age 7 years and continuing until age 32 years.  Hepatitis C blood test.** / For all people born from [birth year] through 1964 and any individual with known risks for hepatitis C.  Osteoporosis screening.** / A one-time screening for women ages 30 years and over and women at risk for fractures or osteoporosis.  Skin self-exam. / Monthly.  Influenza vaccine. / Every year.  Tetanus, diphtheria, and acellular pertussis (Tdap/Td)  vaccine.** / 1 dose of Td every 10 years.  Varicella vaccine.** / Consult your health care provider.  Zoster vaccine.** / 1 dose for adults aged 35 years or older.  Pneumococcal 13-valent conjugate (PCV13) vaccine.** / Consult your health care provider.  Pneumococcal polysaccharide (PPSV23) vaccine.** / 1 dose for all adults aged 46 years and older.  Meningococcal vaccine.** / Consult your health care provider.  Hepatitis A vaccine.** / Consult your health care provider.  Hepatitis B vaccine.** / Consult your health care provider.  Haemophilus influenzae type b (Hib) vaccine.** / Consult your health care provider. ** Family history and personal history of risk and conditions may change your health care provider's recommendations.   This information is not intended to replace advice given to you by your health care provider. Make sure you discuss any questions you have with your health care provider.   Document Released: 12/20/2001 Document Revised: 11/14/2014 Document Reviewed: 03/21/2011 Elsevier Interactive Patient Education [employer].

## 2016-07-20 NOTE — Progress Notes (Signed)
Subjective:  Patient ID: Lisa Mcconnell, female    DOB: Apr 19, 1956  Age: 60 y.o. MRN: [id number]  CC: Anemia; Hypertension; Osteoarthritis; and Annual Exam   HPI Lisa Mcconnell presents for a CPX.  She complains of constipation for many years. She is tried to treat it with Dulcolax, Colace, and several other OTC agents without much relief from her symptoms. She does not take any medications that cause constipation.  She also complains of chronic pain in her feet. She has been told by someone else that x-ray showed arthritis. She has not taken anything for the pain. She wants something for pain and to be referred to orthopedics for second opinion.  Her blood pressure is well-controlled with the combination of amlodipine, losartan, and hydrochlorothiazide. She denies any recent episodes of headache/blurred vision/chest pain/shortness of breath/palpitations/edema/fatigue.  History Lisa Mcconnell has a past medical history of ACE-inhibitor cough; Anemia; Arthritis; Chronic constipation; Hemorrhoids; Hypertension; and Obesity.   She has a past surgical history that includes left knee replacement (03/2008); Cesarean section; Colonoscopy; Total knee arthroplasty (Right, 10/12/2015); and Total knee arthroplasty (Right, 10/12/2015).   Her family history includes Alcohol abuse in her father; Aneurysm in her mother; Heart attack in her father; Hypertension in her father and mother; Kidney disease in her maternal grandfather, maternal grandmother, paternal grandfather, and paternal grandmother; Kidney failure in her mother; Stroke in her mother.She reports that she has never smoked. She has never used smokeless tobacco. She reports that she does not drink alcohol or use drugs.  Outpatient Medications Prior to Visit  Medication Sig Dispense Refill  . amLODipine (NORVASC) 5 MG tablet Take 1 tablet (5 mg total) by mouth daily. 30 tablet 0  . losartan-hydrochlorothiazide (HYZAAR) 100-12.5 MG tablet Take 1 tablet  by mouth daily. 30 tablet 0  . aspirin EC 325 MG tablet Take 1 tablet (325 mg total) by mouth 2 (two) times daily. 30 tablet 0  . celecoxib (CELEBREX) 100 MG capsule Take 1 capsule (100 mg total) by mouth 2 (two) times daily. (Patient taking differently: Take 100 mg by mouth 2 (two) times daily as needed for moderate pain. ) 20 capsule 0  . Docusate Calcium (STOOL SOFTENER PO) Take 1 capsule by mouth 4 (four) times daily.    Marland Kitchen HYDROcodone-acetaminophen (NORCO/VICODIN) 5-325 MG per tablet Take 1 tablet by mouth every 6 (six) hours as needed for moderate pain. 25 tablet 0  . oxyCODONE-acetaminophen (PERCOCET/ROXICET) 5-325 MG tablet Take 1-2 tablets by mouth every 4 (four) hours as needed for severe pain. 60 tablet 0  . tizanidine (ZANAFLEX) 2 MG capsule Take 1 capsule (2 mg total) by mouth 3 (three) times daily. 60 capsule 0   No facility-administered medications prior to visit.     ROS Review of Systems  Constitutional: Negative.  Negative for activity change, appetite change, chills, diaphoresis, fatigue, fever and unexpected weight change.  HENT: Negative.  Negative for facial swelling, sinus pressure and trouble swallowing.   Eyes: Negative.  Negative for photophobia and visual disturbance.  Respiratory: Negative.  Negative for cough, choking, chest tightness, shortness of breath and stridor.   Cardiovascular: Negative.  Negative for chest pain, palpitations and leg swelling.  Gastrointestinal: Positive for constipation. Negative for abdominal distention, abdominal pain, anal bleeding, blood in stool, diarrhea, nausea, rectal pain and vomiting.  Endocrine: Negative.   Genitourinary: Negative.  Negative for decreased urine volume, dysuria, frequency, hematuria and urgency.  Musculoskeletal: Positive for arthralgias. Negative for gait problem, joint swelling, myalgias and neck pain.  Skin: Negative.  Negative for color change.  Allergic/Immunologic: Negative.   Neurological: Negative.   Negative for dizziness.  Hematological: Negative.  Negative for adenopathy. Does not bruise/bleed easily.  Psychiatric/Behavioral: Negative.     Objective:  BP (!) 142/76 (BP Location: Left Arm, Patient Position: Sitting, Cuff Size: Large)   Pulse 88   Temp 98.3 F (36.8 C) (Oral)   Resp 16   Ht 5\' 8"  (1.727 m)   Wt 259 lb 0.6 oz (117.5 kg)   SpO2 98%   BMI 39.39 kg/m   Physical Exam  Constitutional: She is oriented to person, place, and time. No distress.  HENT:  Mouth/Throat: No oropharyngeal exudate.  Eyes: Conjunctivae are normal. Right eye exhibits no discharge. Left eye exhibits no discharge. No scleral icterus.  Neck: Normal range of motion. Neck supple. No JVD present. No tracheal deviation present. No thyromegaly present.  Cardiovascular: Normal rate, regular rhythm, normal heart sounds and intact distal pulses.  Exam reveals no gallop and no friction rub.   No murmur heard. Pulmonary/Chest: Effort normal and breath sounds normal. No stridor. No respiratory distress. She has no wheezes. She has no rales. She exhibits no tenderness.  Abdominal: Soft. Bowel sounds are normal. She exhibits no distension and no mass. There is no tenderness. There is no rebound and no guarding.  Genitourinary:  Genitourinary Comments: Breasts, GU, and rectal exams were deferred at her request. She wants to be referred to a gynecologist for this.  Musculoskeletal: Normal range of motion. She exhibits no edema, tenderness or deformity.  Lymphadenopathy:    She has no cervical adenopathy.  Neurological: She is oriented to person, place, and time.  Skin: Skin is warm and dry. No rash noted. She is not diaphoretic. No erythema. No pallor.  Psychiatric: She has a normal mood and affect. Her behavior is normal. Judgment and thought content normal.  Vitals reviewed.   Lab Results  Component Value Date   WBC 12.6 (H) 10/15/2015   HGB 9.7 (L) 10/15/2015   HCT 30.4 (L) 10/15/2015   PLT 162  10/15/2015   GLUCOSE 142 (H) 10/13/2015   CHOL 174 09/12/2014   TRIG 61 09/12/2014   HDL 65 09/12/2014   LDLCALC 97 09/12/2014   ALT 18 09/12/2014   AST 29 09/12/2014   NA 135 10/13/2015   K 3.9 10/13/2015   CL 100 (L) 10/13/2015   CREATININE 0.73 10/13/2015   BUN 7 10/13/2015   CO2 25 10/13/2015   TSH 1.51 06/19/2009   INR 1.07 10/06/2015   HGBA1C 5.8 (H) 05/19/2010    Assessment & Plan:   Lisa Mcconnell was seen today for anemia, hypertension, osteoarthritis and annual exam.  Diagnoses and all orders for this visit:  Deficiency anemia- she was seen somewhere else nearly a year ago and was noted to have a normochromic, normocytic anemia and a slightly elevated white blood cell count. I will recheck her CBC today to see if there are any changes in these numbers and will screen her for vitamin deficiency. -     Vitamin B12; Future -     IBC panel; Future -     Folate; Future -     Ferritin; Future  Essential hypertension- her blood pressure is well-controlled on the current combination, I will monitor her electrolytes and renal function.  Constipation, unspecified constipation type- the only medication she takes it could be causing this is amlodipine, I will check her labs today to screen for secondary metabolic causes. For now  will treat with Amitiza and if she doesn't improve soon will consider discontinuation of the amlodipine. -     Magnesium; Future -     lubiprostone (AMITIZA) 24 MCG capsule; Take 1 capsule (24 mcg total) by mouth 2 (two) times daily with a meal.  Routine general medical examination at a health care facility- exam completed, labs ordered and will be reviewed, vaccines reviewed and updated, her colonoscopy is up-to-date, she was referred for a Pap smear and mammogram, patient education material was given. -     Lipid panel; Future -     Comprehensive metabolic panel; Future -     CBC with Differential/Platelet; Future -     TSH; Future -     Hepatitis C antibody;  Future -     HIV antibody; Future  Osteoarthritis of foot, unspecified laterality, unspecified osteoarthritis type -     Ambulatory referral to Orthopedic Surgery -     etodolac (LODINE XL) 500 MG 24 hr tablet; Take 1 tablet (500 mg total) by mouth daily.  Cervical cancer screening -     Ambulatory referral to Gynecology  Visit for screening mammogram -     MM DIGITAL SCREENING BILATERAL; Future  Hyperglycemia- her last A1c was 5.8% 6 years ago, at that time she was prediabetic, I will recheck her A1c today to see if she has developed type 2 diabetes mellitus. -     Hemoglobin A1c; Future   I have discontinued Ms. Mcburney's celecoxib, HYDROcodone-acetaminophen, Docusate Calcium (STOOL SOFTENER PO), oxyCODONE-acetaminophen, aspirin EC, and tizanidine. I am also having her start on etodolac and lubiprostone. Additionally, I am having her maintain her losartan-hydrochlorothiazide and amLODipine.  Meds ordered this encounter  Medications  . etodolac (LODINE XL) 500 MG 24 hr tablet    Sig: Take 1 tablet (500 mg total) by mouth daily.    Dispense:  90 tablet    Refill:  1  . lubiprostone (AMITIZA) 24 MCG capsule    Sig: Take 1 capsule (24 mcg total) by mouth 2 (two) times daily with a meal.    Dispense:  180 capsule    Refill:  3     Follow-up: Return in about 4 months (around 11/19/2016).  Lisa Calico, MD

## 2016-08-03 ENCOUNTER — Telehealth: Payer: Self-pay | Admitting: Obstetrics and Gynecology

## 2016-08-03 NOTE — Telephone Encounter (Signed)
Called and left a message for patient to call back to schedule a new patient doctor referral. °

## 2016-08-04 NOTE — Telephone Encounter (Signed)
Called and left a message for patient to call back to schedule a new patient doctor referral. °

## 2016-08-08 NOTE — Telephone Encounter (Signed)
Called and left a message for patient to call back to schedule a new patient doctor referral. °

## 2016-08-09 ENCOUNTER — Telehealth: Payer: Self-pay | Admitting: *Deleted

## 2016-08-09 MED ORDER — ETODOLAC 500 MG PO TABS: 500.0000 mg | ORAL_TABLET | Freq: Every day | ORAL | 3 refills | Status: DC

## 2016-08-09 NOTE — Telephone Encounter (Signed)
YES IT IS OK TO SUBSTITUTE

## 2016-08-09 NOTE — Telephone Encounter (Signed)
Rec'd fax stating pt is requesting refill on Etodolac XL, but we do not stock the 500 mg XL is it ok to substitute IR...Lisa Mcconnell

## 2016-08-09 NOTE — Telephone Encounter (Signed)
Sent IR Etodolac into pharmacy...Lisa Mcconnell

## 2016-08-11 ENCOUNTER — Other Ambulatory Visit (INDEPENDENT_AMBULATORY_CARE_PROVIDER_SITE_OTHER): Payer: 59

## 2016-08-11 DIAGNOSIS — Z0000 Encounter for general adult medical examination without abnormal findings: Secondary | ICD-10-CM

## 2016-08-11 DIAGNOSIS — D539 Nutritional anemia, unspecified: Secondary | ICD-10-CM | POA: Diagnosis not present

## 2016-08-11 DIAGNOSIS — R739 Hyperglycemia, unspecified: Secondary | ICD-10-CM | POA: Diagnosis not present

## 2016-08-11 DIAGNOSIS — K5900 Constipation, unspecified: Secondary | ICD-10-CM

## 2016-08-11 LAB — COMPREHENSIVE METABOLIC PANEL
ALBUMIN: 4 g/dL (ref 3.5–5.2)
ALT: 7 U/L (ref 0–35)
AST: 15 U/L (ref 0–37)
Alkaline Phosphatase: 84 U/L (ref 39–117)
BILIRUBIN TOTAL: 0.5 mg/dL (ref 0.2–1.2)
BUN: 12 mg/dL (ref 6–23)
CO2: 30 mEq/L (ref 19–32)
Calcium: 9.5 mg/dL (ref 8.4–10.5)
Chloride: 102 mEq/L (ref 96–112)
Creatinine, Ser: 0.74 mg/dL (ref 0.40–1.20)
GFR: 103.04 mL/min (ref >60.00)
GLUCOSE: 85 mg/dL (ref 70–99)
Potassium: 3.7 mEq/L (ref 3.5–5.1)
Sodium: 139 mEq/L (ref 135–145)
TOTAL PROTEIN: 8 g/dL (ref 6.0–8.3)

## 2016-08-11 LAB — CBC WITH DIFFERENTIAL/PLATELET
BASOS ABS: 0 10*3/uL (ref 0.0–0.1)
Basophils Relative: 0.6 % (ref 0.0–3.0)
Eosinophils Absolute: 0.5 10*3/uL (ref 0.0–0.7)
Eosinophils Relative: 8.2 % — ABNORMAL HIGH (ref 0.0–5.0)
HCT: 38.4 % (ref 36.0–46.0)
Hemoglobin: 13 g/dL (ref 12.0–15.0)
LYMPHS ABS: 2.2 10*3/uL (ref 0.7–4.0)
Lymphocytes Relative: 37.9 % (ref 12.0–46.0)
MCHC: 33.8 g/dL (ref 30.0–36.0)
MCV: 91.6 fl (ref 78.0–100.0)
MONO ABS: 0.4 10*3/uL (ref 0.1–1.0)
MONOS PCT: 7.4 % (ref 3.0–12.0)
NEUTROS PCT: 45.9 % (ref 43.0–77.0)
Neutro Abs: 2.6 10*3/uL (ref 1.4–7.7)
Platelets: 246 10*3/uL (ref 150.0–400.0)
RBC: 4.19 Mil/uL (ref 3.87–5.11)
RDW: 13.2 % (ref 11.5–15.5)
WBC: 5.7 10*3/uL (ref 4.0–10.5)

## 2016-08-11 LAB — MAGNESIUM: Magnesium: 2 mg/dL (ref 1.5–2.5)

## 2016-08-11 LAB — IBC PANEL
IRON: 73 ug/dL (ref 42–145)
SATURATION RATIOS: 20.9 % (ref 20.0–50.0)
TRANSFERRIN: 249 mg/dL (ref 212.0–360.0)

## 2016-08-11 LAB — VITAMIN B12: VITAMIN B 12: 290 pg/mL (ref 211–911)

## 2016-08-11 LAB — HEMOGLOBIN A1C: Hgb A1c MFr Bld: 5.7 % (ref 4.6–6.5)

## 2016-08-11 LAB — FOLATE: Folate: 12.4 ng/mL (ref >5.9)

## 2016-08-11 LAB — FERRITIN: Ferritin: 86.1 ng/mL (ref 10.0–291.0)

## 2016-08-11 LAB — TSH: TSH: 1.6 u[IU]/mL (ref 0.35–4.50)

## 2016-08-11 NOTE — Telephone Encounter (Signed)
Routing referral back to referring office. Patient has not returned calls to schedule an appointment.

## 2016-08-12 ENCOUNTER — Encounter: Payer: Self-pay | Admitting: Internal Medicine

## 2016-08-12 LAB — HEPATITIS C ANTIBODY: HCV Ab: NEGATIVE

## 2016-08-12 LAB — HIV ANTIBODY (ROUTINE TESTING W REFLEX): HIV 1&2 Ab, 4th Generation: NONREACTIVE

## 2016-08-20 ENCOUNTER — Other Ambulatory Visit: Payer: Self-pay | Admitting: Internal Medicine

## 2016-09-09 ENCOUNTER — Encounter: Payer: Self-pay | Admitting: Internal Medicine

## 2017-02-14 DIAGNOSIS — M9902 Segmental and somatic dysfunction of thoracic region: Secondary | ICD-10-CM | POA: Diagnosis not present

## 2017-02-14 DIAGNOSIS — M9901 Segmental and somatic dysfunction of cervical region: Secondary | ICD-10-CM | POA: Diagnosis not present

## 2017-02-14 DIAGNOSIS — M5384 Other specified dorsopathies, thoracic region: Secondary | ICD-10-CM | POA: Diagnosis not present

## 2017-02-14 DIAGNOSIS — M50122 Cervical disc disorder at C5-C6 level with radiculopathy: Secondary | ICD-10-CM | POA: Diagnosis not present

## 2017-03-30 ENCOUNTER — Other Ambulatory Visit: Payer: Self-pay | Admitting: Internal Medicine

## 2017-04-04 ENCOUNTER — Other Ambulatory Visit: Payer: Self-pay | Admitting: Internal Medicine

## 2017-04-06 ENCOUNTER — Encounter: Payer: Self-pay | Admitting: Nurse Practitioner

## 2017-04-06 ENCOUNTER — Ambulatory Visit (INDEPENDENT_AMBULATORY_CARE_PROVIDER_SITE_OTHER)
Admission: RE | Admit: 2017-04-06 | Discharge: 2017-04-06 | Disposition: A | Discharge status: Home / Self Care | Payer: Self-pay | Source: Ambulatory Visit | Attending: Nurse Practitioner | Admitting: Nurse Practitioner

## 2017-04-06 ENCOUNTER — Ambulatory Visit (INDEPENDENT_AMBULATORY_CARE_PROVIDER_SITE_OTHER): Payer: BLUE CROSS/BLUE SHIELD | Admitting: Nurse Practitioner

## 2017-04-06 DIAGNOSIS — R32 Unspecified urinary incontinence: Secondary | ICD-10-CM

## 2017-04-06 DIAGNOSIS — M545 Low back pain, unspecified: Secondary | ICD-10-CM

## 2017-04-06 MED ORDER — TRAMADOL HCL 50 MG PO TABS: 50.0000 mg | ORAL_TABLET | Freq: Two times a day (BID) | ORAL | 0 refills | Status: DC | PRN

## 2017-04-06 MED ORDER — TIZANIDINE HCL 4 MG PO CAPS: 4.0000 mg | ORAL_CAPSULE | Freq: Three times a day (TID) | ORAL | 0 refills | Status: DC | PRN

## 2017-04-06 MED ORDER — NAPROXEN 500 MG PO TABS: 500.0000 mg | ORAL_TABLET | Freq: Two times a day (BID) | ORAL | 0 refills | Status: DC | PRN

## 2017-04-06 NOTE — Patient Instructions (Addendum)
Please go to basement for back x-ray. You will be call with results.  Use cold compress 2times a day x 24hrs, then alternate between warm and cold compress as needed.  Back Pain, Adult Back pain is very common in adults.The cause of back pain is rarely dangerous and the pain often gets better over time.The cause of your back pain may not be known. Some common causes of back pain include:  Strain of the muscles or ligaments supporting the spine.  Wear and tear (degeneration) of the spinal disks.  Arthritis.  Direct injury to the back.  For many people, back pain may return. Since back pain is rarely dangerous, most people can learn to manage this condition on their own. Follow these instructions at home: Watch your back pain for any changes. The following actions may help to lessen any discomfort you are feeling:  Remain active. It is stressful on your back to sit or stand in one place for long periods of time. Do not sit, drive, or stand in one place for more than 30 minutes at a time. Take short walks on even surfaces as soon as you are able.Try to increase the length of time you walk each day.  Exercise regularly as directed by your health care provider. Exercise helps your back heal faster. It also helps avoid future injury by keeping your muscles strong and flexible.  Do not stay in bed.Resting more than 1-2 days can delay your recovery.  Pay attention to your body when you bend and lift. The most comfortable positions are those that put less stress on your recovering back. Always use proper lifting techniques, including: ? Bending your knees. ? Keeping the load close to your body. ? Avoiding twisting.  Find a comfortable position to sleep. Use a firm mattress and lie on your side with your knees slightly bent. If you lie on your back, put a pillow under your knees.  Avoid feeling anxious or stressed.Stress increases muscle tension and can worsen back pain.It is important  to recognize when you are anxious or stressed and learn ways to manage it, such as with exercise.  Take medicines only as directed by your health care provider. Over-the-counter medicines to reduce pain and inflammation are often the most helpful.Your health care provider may prescribe muscle relaxant drugs.These medicines help dull your pain so you can more quickly return to your normal activities and healthy exercise.  Apply ice to the injured area: ? Put ice in a plastic bag. ? Place a towel between your skin and the bag. ? Leave the ice on for 20 minutes, 2-3 times a day for the first 2-3 days. After that, ice and heat may be alternated to reduce pain and spasms.  Maintain a healthy weight. Excess weight puts extra stress on your back and makes it difficult to maintain good posture.  Contact a health care provider if:  You have pain that is not relieved with rest or medicine.  You have increasing pain going down into the legs or buttocks.  You have pain that does not improve in one week.  You have night pain.  You lose weight.  You have a fever or chills. Get help right away if:  You develop new bowel or bladder control problems.  You have unusual weakness or numbness in your arms or legs.  You develop nausea or vomiting.  You develop abdominal pain.  You feel faint. This information is not intended to replace advice given to you  by your health care provider. Make sure you discuss any questions you have with your health care provider. Document Released: 10/24/2005 Document Revised: 03/03/2016 Document Reviewed: 02/25/2014 Elsevier Interactive Patient Education  2017 [employer].
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
## 2017-04-10 ENCOUNTER — Telehealth: Payer: Self-pay | Admitting: Nurse Practitioner

## 2017-04-10 DIAGNOSIS — M545 Low back pain, unspecified: Secondary | ICD-10-CM

## 2017-04-10 DIAGNOSIS — R32 Unspecified urinary incontinence: Secondary | ICD-10-CM

## 2017-04-10 NOTE — Telephone Encounter (Signed)
Requesting new order to be entered for lumbar w/ and w/out contrast (if wanting w/).  Wants new order b/c patient has already been screened.  Please follow up with questions.

## 2017-04-10 NOTE — Telephone Encounter (Signed)
Please help change order.

## 2017-04-11 NOTE — Telephone Encounter (Signed)
Cecille Rubin told me this was taken care of. Please talk to her about this order.

## 2017-04-11 NOTE — Telephone Encounter (Signed)
[facility] needs a new referral put in, please.

## 2017-04-14 ENCOUNTER — Encounter: Payer: Self-pay | Admitting: Family Medicine

## 2017-04-14 ENCOUNTER — Telehealth: Payer: Self-pay | Admitting: Internal Medicine

## 2017-04-14 ENCOUNTER — Ambulatory Visit (INDEPENDENT_AMBULATORY_CARE_PROVIDER_SITE_OTHER): Payer: BLUE CROSS/BLUE SHIELD

## 2017-04-14 ENCOUNTER — Ambulatory Visit (INDEPENDENT_AMBULATORY_CARE_PROVIDER_SITE_OTHER): Payer: BLUE CROSS/BLUE SHIELD | Admitting: Family Medicine

## 2017-04-14 VITALS — BP 162/68 | HR 66 | Temp 98.0°F | Ht 68.0 in | Wt 261.4 lb

## 2017-04-14 DIAGNOSIS — R059 Cough, unspecified: Secondary | ICD-10-CM

## 2017-04-14 DIAGNOSIS — R079 Chest pain, unspecified: Secondary | ICD-10-CM

## 2017-04-14 DIAGNOSIS — R9389 Abnormal findings on diagnostic imaging of other specified body structures: Secondary | ICD-10-CM

## 2017-04-14 DIAGNOSIS — R938 Abnormal findings on diagnostic imaging of other specified body structures: Secondary | ICD-10-CM

## 2017-04-14 DIAGNOSIS — J189 Pneumonia, unspecified organism: Secondary | ICD-10-CM | POA: Diagnosis not present

## 2017-04-14 DIAGNOSIS — R05 Cough: Secondary | ICD-10-CM | POA: Diagnosis not present

## 2017-04-14 MED ORDER — AZITHROMYCIN 250 MG PO TABS: ORAL_TABLET | ORAL | 0 refills | Status: DC

## 2017-04-14 MED ORDER — ALBUTEROL SULFATE (2.5 MG/3ML) 0.083% IN NEBU
2.5000 mg | INHALATION_SOLUTION | Freq: Once | RESPIRATORY_TRACT | Status: AC
  Administered 2017-04-14: 2.5 mg via RESPIRATORY_TRACT

## 2017-04-14 MED ORDER — HYDROCODONE-HOMATROPINE 5-1.5 MG/5ML PO SYRP: 5.0000 mL | ORAL_SOLUTION | Freq: Three times a day (TID) | ORAL | 0 refills | Status: DC | PRN

## 2017-04-14 MED ORDER — METHYLPREDNISOLONE ACETATE 80 MG/ML IJ SUSP
80.0000 mg | Freq: Once | INTRAMUSCULAR | Status: AC
  Administered 2017-04-14: 80 mg via INTRAMUSCULAR

## 2017-04-14 MED ORDER — CEFTRIAXONE SODIUM 1 G IJ SOLR
1.0000 g | Freq: Once | INTRAMUSCULAR | Status: AC
  Administered 2017-04-14: 1 g via INTRAMUSCULAR

## 2017-04-14 NOTE — Telephone Encounter (Signed)
Okay 

## 2017-04-14 NOTE — Telephone Encounter (Signed)
Patient would like to transfer care to Dr. Juleen China at [facility]. Out of courtesy for both providers I will await approval from Dr. Ronnald Ramp before making transfer official and notifying patient.
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
## 2017-04-14 NOTE — Telephone Encounter (Signed)
Yes, ok with me 

## 2017-04-15 ENCOUNTER — Ambulatory Visit
Admission: RE | Admit: 2017-04-15 | Discharge: 2017-04-15 | Disposition: A | Discharge status: Home / Self Care | Payer: BLUE CROSS/BLUE SHIELD | Source: Ambulatory Visit | Attending: Nurse Practitioner | Admitting: Nurse Practitioner

## 2017-04-15 DIAGNOSIS — M545 Low back pain, unspecified: Secondary | ICD-10-CM

## 2017-04-15 DIAGNOSIS — S3992XA Unspecified injury of lower back, initial encounter: Secondary | ICD-10-CM | POA: Diagnosis not present

## 2017-04-15 DIAGNOSIS — R32 Unspecified urinary incontinence: Secondary | ICD-10-CM

## 2017-04-15 MED ORDER — GADOBENATE DIMEGLUMINE 529 MG/ML IV SOLN
20.0000 mL | Freq: Once | INTRAVENOUS | Status: AC | PRN
  Administered 2017-04-15: 20 mL via INTRAVENOUS

## 2017-04-17 ENCOUNTER — Telehealth: Payer: Self-pay | Admitting: Family Medicine

## 2017-04-17 NOTE — Telephone Encounter (Signed)
I called and advised patient that I received approval from Dr. Juleen China and Dr. Ronnald Ramp for her to transfer care. Patient may schedule an office visit for official transfer of care, stated she would call back.  Patient is requesting a call back about her MRI she had done Saturday.   Please advise.

## 2017-04-17 NOTE — Telephone Encounter (Signed)
Please advise 

## 2017-04-19 DIAGNOSIS — M9901 Segmental and somatic dysfunction of cervical region: Secondary | ICD-10-CM | POA: Diagnosis not present

## 2017-04-19 NOTE — Telephone Encounter (Signed)
Has anyone contacted the patient about her MRI (from the other office)? If not - let her know that it did not show any reason for the new urinary incontinence. Showed arthritis. Would rec. referral to Urology urgently if it has continued. How is she feeling overall? Chest discomfort, SOB? If any concerns - come in, low thresh hold for CT chest as well.

## 2017-04-20 NOTE — Telephone Encounter (Signed)
LM for patient to return call.

## 2017-04-25 NOTE — Telephone Encounter (Signed)
Spoke with patient and she is feeling much better. Her symptoms have resolved except for her low back pain. She is seeing a Restaurant manager, fast food for this. She will call with any other concerns.

## 2017-05-08 ENCOUNTER — Other Ambulatory Visit: Payer: Self-pay | Admitting: Family Medicine

## 2017-06-03 ENCOUNTER — Other Ambulatory Visit: Payer: Self-pay | Admitting: Family Medicine

## 2017-12-28 ENCOUNTER — Encounter: Payer: Self-pay | Admitting: Family Medicine

## 2017-12-28 ENCOUNTER — Ambulatory Visit (INDEPENDENT_AMBULATORY_CARE_PROVIDER_SITE_OTHER): Payer: BLUE CROSS/BLUE SHIELD | Admitting: Family Medicine

## 2017-12-28 VITALS — BP 144/68 | HR 67 | Temp 98.2°F | Ht 68.0 in | Wt 269.4 lb

## 2017-12-28 DIAGNOSIS — E8881 Metabolic syndrome: Secondary | ICD-10-CM | POA: Diagnosis not present

## 2017-12-28 DIAGNOSIS — I10 Essential (primary) hypertension: Secondary | ICD-10-CM

## 2017-12-28 DIAGNOSIS — E7800 Pure hypercholesterolemia, unspecified: Secondary | ICD-10-CM | POA: Diagnosis not present

## 2017-12-28 LAB — LIPID PANEL
Cholesterol: 176 mg/dL (ref 0–200)
HDL: 60.7 mg/dL (ref >39.00)
LDL Cholesterol: 101 mg/dL — ABNORMAL HIGH (ref 0–99)
NonHDL: 115.22
Total CHOL/HDL Ratio: 3
Triglycerides: 73 mg/dL (ref 0.0–149.0)
VLDL: 14.6 mg/dL (ref 0.0–40.0)

## 2017-12-28 LAB — COMPREHENSIVE METABOLIC PANEL
ALT: 7 U/L (ref 0–35)
AST: 21 U/L (ref 0–37)
Albumin: 3.9 g/dL (ref 3.5–5.2)
Alkaline Phosphatase: 62 U/L (ref 39–117)
BUN: 13 mg/dL (ref 6–23)
CO2: 29 mEq/L (ref 19–32)
Calcium: 9.7 mg/dL (ref 8.4–10.5)
Chloride: 105 mEq/L (ref 96–112)
Creatinine, Ser: 0.87 mg/dL (ref 0.40–1.20)
GFR: 85.09 mL/min (ref >60.00)
Glucose, Bld: 96 mg/dL (ref 70–99)
Potassium: 3.9 mEq/L (ref 3.5–5.1)
Sodium: 140 mEq/L (ref 135–145)
Total Bilirubin: 0.5 mg/dL (ref 0.2–1.2)
Total Protein: 7.1 g/dL (ref 6.0–8.3)

## 2017-12-28 LAB — CBC WITH DIFFERENTIAL/PLATELET
Basophils Absolute: 0 10*3/uL (ref 0.0–0.1)
Basophils Relative: 0.5 % (ref 0.0–3.0)
Eosinophils Absolute: 0.2 10*3/uL (ref 0.0–0.7)
Eosinophils Relative: 3.7 % (ref 0.0–5.0)
HCT: 38.2 % (ref 36.0–46.0)
Hemoglobin: 12.7 g/dL (ref 12.0–15.0)
Lymphocytes Relative: 43.5 % (ref 12.0–46.0)
Lymphs Abs: 2.6 10*3/uL (ref 0.7–4.0)
MCHC: 33.3 g/dL (ref 30.0–36.0)
MCV: 95.6 fl (ref 78.0–100.0)
Monocytes Absolute: 0.4 10*3/uL (ref 0.1–1.0)
Monocytes Relative: 6.3 % (ref 3.0–12.0)
Neutro Abs: 2.7 10*3/uL (ref 1.4–7.7)
Neutrophils Relative %: 46 % (ref 43.0–77.0)
Platelets: 218 10*3/uL (ref 150.0–400.0)
RBC: 4 Mil/uL (ref 3.87–5.11)
RDW: 13.3 % (ref 11.5–15.5)
WBC: 5.9 10*3/uL (ref 4.0–10.5)

## 2017-12-28 LAB — HEMOGLOBIN A1C: Hgb A1c MFr Bld: 5.9 % (ref 4.6–6.5)

## 2017-12-28 NOTE — Progress Notes (Signed)
Lisa Mcconnell is a 62 y.o. female is here for follow up.  History of Present Illness:   HPI: See Assessment and Plan section for Problem Based Charting of issues discussed today.  Health Maintenance Due  Topic Date Due  . PAP SMEAR  06/25/2011  . INFLUENZA VACCINE  06/07/2017  . MAMMOGRAM  08/24/2017   PMHx, SurgHx, SocialHx, FamHx, Medications, and Allergies were reviewed in the Visit Navigator and updated as appropriate.   Patient Active Problem List   Diagnosis Date Noted  . Deficiency anemia 07/20/2016  . Arthritis, senescent 07/20/2016  . Visit for screening mammogram 07/20/2016  . Hyperglycemia 07/20/2016  . Primary osteoarthritis of right knee 10/11/2015  . Hypertension 10/06/2008  . Constipation 09/16/2008   Social History   Tobacco Use  . Smoking status: Never Smoker  . Smokeless tobacco: Never Used  Substance Use Topics  . Alcohol use: No  . Drug use: No   Current Medications and Allergies:   Current Outpatient Medications:  .  amLODipine (NORVASC) 5 MG tablet, TAKE 1 TABLET ONCE DAILY., Disp: 30 tablet, Rfl: 5 .  losartan-hydrochlorothiazide (HYZAAR) 100-12.5 MG tablet, TAKE 1 TABLET ONCE DAILY., Disp: 30 tablet, Rfl: 5   Allergies  Allergen Reactions  . Benazepril Hcl Cough   Review of Systems   Pertinent items are noted in the HPI. Otherwise, ROS is negative.  Vitals:   Vitals:   12/28/17 0902  BP: (!) 144/68  Pulse: 67  Temp: 98.2 F (36.8 C)  TempSrc: Oral  SpO2: 98%  Weight: 269 lb 6.4 oz (122.2 kg)  Height: 5\' 8"  (1.727 m)     Body mass index is 40.96 kg/m.  Physical Exam:   Physical Exam  Constitutional: She is oriented to person, place, and time. She appears well-developed and well-nourished. No distress.  HENT:  Head: Normocephalic and atraumatic.  Right Ear: External ear normal.  Left Ear: External ear normal.  Nose: Nose normal.  Mouth/Throat: Oropharynx is clear and moist.  Eyes: Conjunctivae and EOM are normal.  Pupils are equal, round, and reactive to light.  Neck: Normal range of motion. Neck supple. No thyromegaly present.  Cardiovascular: Normal rate, regular rhythm, normal heart sounds and intact distal pulses.  Pulmonary/Chest: Effort normal and breath sounds normal.  Abdominal: Soft. Bowel sounds are normal.  Musculoskeletal: Normal range of motion.  Lymphadenopathy:    She has no cervical adenopathy.  Neurological: She is alert and oriented to person, place, and time.  Skin: Skin is warm and dry. Capillary refill takes less than 2 seconds.  Psychiatric: She has a normal mood and affect. Her behavior is normal.  Nursing note and vitals reviewed.    Results for orders placed or performed in visit on 12/28/17  Comprehensive metabolic panel  Result Value Ref Range   Sodium 140 135 - 145 mEq/L   Potassium 3.9 3.5 - 5.1 mEq/L   Chloride 105 96 - 112 mEq/L   CO2 29 19 - 32 mEq/L   Glucose, Bld 96 70 - 99 mg/dL   BUN 13 6 - 23 mg/dL   Creatinine, Ser 0.87 0.40 - 1.20 mg/dL   Total Bilirubin 0.5 0.2 - 1.2 mg/dL   Alkaline Phosphatase 62 39 - 117 U/L   AST 21 0 - 37 U/L   ALT 7 0 - 35 U/L   Total Protein 7.1 6.0 - 8.3 g/dL   Albumin 3.9 3.5 - 5.2 g/dL   Calcium 9.7 8.4 - 10.5 mg/dL   GFR  85.09 >60.00 mL/min  CBC with Differential/Platelet  Result Value Ref Range   WBC 5.9 4.0 - 10.5 K/uL   RBC 4.00 3.87 - 5.11 Mil/uL   Hemoglobin 12.7 12.0 - 15.0 g/dL   HCT 38.2 36.0 - 46.0 %   MCV 95.6 78.0 - 100.0 fl   MCHC 33.3 30.0 - 36.0 g/dL   RDW 13.3 11.5 - 15.5 %   Platelets 218.0 150.0 - 400.0 K/uL   Neutrophils Relative % 46.0 43.0 - 77.0 %   Lymphocytes Relative 43.5 12.0 - 46.0 %   Monocytes Relative 6.3 3.0 - 12.0 %   Eosinophils Relative 3.7 0.0 - 5.0 %   Basophils Relative 0.5 0.0 - 3.0 %   Neutro Abs 2.7 1.4 - 7.7 K/uL   Lymphs Abs 2.6 0.7 - 4.0 K/uL   Monocytes Absolute 0.4 0.1 - 1.0 K/uL   Eosinophils Absolute 0.2 0.0 - 0.7 K/uL   Basophils Absolute 0.0 0.0 - 0.1 K/uL    Hemoglobin A1c  Result Value Ref Range   Hgb A1c MFr Bld 5.9 4.6 - 6.5 %  Lipid panel  Result Value Ref Range   Cholesterol 176 0 - 200 mg/dL   Triglycerides 73.0 0.0 - 149.0 mg/dL   HDL 60.70 >39.00 mg/dL   VLDL 14.6 0.0 - 40.0 mg/dL   LDL Cholesterol 101 (H) 0 - 99 mg/dL   Total CHOL/HDL Ratio 3    NonHDL 115.22    The 10-year ASCVD risk score Mikey Bussing DC Jr., et al., 2013) is: 8.5%   Values used to calculate the score:     Age: 72 years     Sex: Female     Is Non-Hispanic African American: Yes     Diabetic: No     Tobacco smoker: No     Systolic Blood Pressure: 536 mmHg     Is BP treated: Yes     HDL Cholesterol: 60.7 mg/dL     Total Cholesterol: 176 mg/dL  Down to 4.7% with statin therapy and blood pressure optimization.   Assessment and Plan:   1. Essential hypertension Avoiding excessive salt intake? [x]   YES  []   NO Trying to exercise on a regular basis? []   YES  [x]   NO Review: taking medications as instructed, no medication side effects noted, no TIAs, no chest pain on exertion, no dyspnea on exertion, no swelling of ankles.  Smoker: No.  Wt Readings from Last 3 Encounters:  12/28/17 269 lb 6.4 oz (122.2 kg)  04/14/17 261 lb 6.4 oz (118.6 kg)  04/06/17 264 lb (119.7 kg)   BP Readings from Last 3 Encounters:  12/28/17 (!) 144/68  04/14/17 (!) 162/68  04/06/17 132/70   Lab Results  Component Value Date   CREATININE 0.87 12/28/2017   BP not at goal. Will increase HCTZ to 25 mg daily. Rx sent to pharmacy.  - Comprehensive metabolic panel - CBC with Differential/Platelet - Hemoglobin A1c - Lipid panel  2. Insulin resistance The patient is asked to make an attempt to improve diet and exercise patterns to aid in medical management of this problem.   - Comprehensive metabolic panel - CBC with Differential/Platelet - Hemoglobin A1c - Lipid panel  3. Pure hypercholesterolemia  Lab Results  Component Value Date   CHOL 176 12/28/2017   HDL 60.70  12/28/2017   LDLCALC 101 (H) 12/28/2017   TRIG 73.0 12/28/2017   CHOLHDL 3 12/28/2017   On no statin at this time. Will optimize for risk reduction.  -  pravastatin (PRAVACHOL) 20 MG tablet; Take 1 tablet (20 mg total) by mouth daily.  Dispense: 90 tablet; Refill: 3  4. Morbid obesity ([place]) The patient is asked to make an attempt to improve diet and exercise patterns to aid in medical management of this problem.    . Reviewed expectations re: course of current medical issues. . Discussed self-management of symptoms. . Outlined signs and symptoms indicating need for more acute intervention. . Patient verbalized understanding and all questions were answered. Marland Kitchen Health Maintenance issues including appropriate healthy diet, exercise, and smoking avoidance were discussed with patient. . See orders for this visit as documented in the electronic medical record. . Patient received an After Visit Summary.  Briscoe Deutscher, DO [place] 12/29/2017

## 2017-12-29 DIAGNOSIS — E7800 Pure hypercholesterolemia, unspecified: Secondary | ICD-10-CM | POA: Insufficient documentation

## 2017-12-29 DIAGNOSIS — I2510 Atherosclerotic heart disease of native coronary artery without angina pectoris: Secondary | ICD-10-CM | POA: Insufficient documentation

## 2017-12-29 MED ORDER — LOSARTAN POTASSIUM-HCTZ 100-25 MG PO TABS: 1.0000 | ORAL_TABLET | Freq: Every day | ORAL | 3 refills | Status: DC

## 2017-12-29 MED ORDER — PRAVASTATIN SODIUM 20 MG PO TABS: 20.0000 mg | ORAL_TABLET | Freq: Every day | ORAL | 3 refills | Status: DC

## 2018-01-12 DIAGNOSIS — M67472 Ganglion, left ankle and foot: Secondary | ICD-10-CM | POA: Diagnosis not present

## 2018-01-12 DIAGNOSIS — M2012 Hallux valgus (acquired), left foot: Secondary | ICD-10-CM | POA: Diagnosis not present

## 2018-01-12 DIAGNOSIS — M2011 Hallux valgus (acquired), right foot: Secondary | ICD-10-CM | POA: Diagnosis not present

## 2018-01-12 DIAGNOSIS — M19079 Primary osteoarthritis, unspecified ankle and foot: Secondary | ICD-10-CM | POA: Diagnosis not present

## 2018-01-12 DIAGNOSIS — M7751 Other enthesopathy of right foot: Secondary | ICD-10-CM | POA: Diagnosis not present

## 2018-01-12 DIAGNOSIS — M7752 Other enthesopathy of left foot: Secondary | ICD-10-CM | POA: Diagnosis not present

## 2018-01-12 DIAGNOSIS — M659 Synovitis and tenosynovitis, unspecified: Secondary | ICD-10-CM | POA: Diagnosis not present

## 2018-01-16 ENCOUNTER — Encounter: Payer: BLUE CROSS/BLUE SHIELD | Admitting: Family Medicine

## 2018-01-23 ENCOUNTER — Other Ambulatory Visit (HOSPITAL_COMMUNITY)
Admission: RE | Admit: 2018-01-23 | Discharge: 2018-01-23 | Disposition: A | Discharge status: Home / Self Care | Payer: BLUE CROSS/BLUE SHIELD | Source: Ambulatory Visit | Attending: Family Medicine | Admitting: Family Medicine

## 2018-01-23 ENCOUNTER — Encounter: Payer: Self-pay | Admitting: Family Medicine

## 2018-01-23 ENCOUNTER — Ambulatory Visit (INDEPENDENT_AMBULATORY_CARE_PROVIDER_SITE_OTHER): Payer: BLUE CROSS/BLUE SHIELD | Admitting: Family Medicine

## 2018-01-23 VITALS — BP 116/72 | HR 76 | Temp 98.2°F | Ht 68.0 in | Wt 264.6 lb

## 2018-01-23 DIAGNOSIS — Z124 Encounter for screening for malignant neoplasm of cervix: Secondary | ICD-10-CM

## 2018-01-23 DIAGNOSIS — E7800 Pure hypercholesterolemia, unspecified: Secondary | ICD-10-CM | POA: Diagnosis not present

## 2018-01-23 DIAGNOSIS — Z0000 Encounter for general adult medical examination without abnormal findings: Secondary | ICD-10-CM

## 2018-01-23 DIAGNOSIS — Z1231 Encounter for screening mammogram for malignant neoplasm of breast: Secondary | ICD-10-CM

## 2018-01-23 MED ORDER — ASPIRIN EC 81 MG PO TBEC: 81.0000 mg | DELAYED_RELEASE_TABLET | Freq: Every day | ORAL | 0 refills | Status: DC

## 2018-01-23 NOTE — Progress Notes (Signed)
Subjective:    Lisa Mcconnell is a 62 y.o. female and is here for a comprehensive physical exam.  Health Maintenance Due  Topic Date Due  . MAMMOGRAM  08/24/2017   PMHx, SurgHx, SocialHx, Medications, and Allergies were reviewed in the Visit Navigator and updated as appropriate.   Past Medical History:  Diagnosis Date  . ACE-inhibitor cough   . Anemia   . Arthritis   . Chronic constipation   . Hemorrhoids   . Hypertension   . Obesity     Past Surgical History:  Procedure Laterality Date  . CESAREAN SECTION     x 3   . COLONOSCOPY    . left knee replacement  03/2008  . TOTAL KNEE ARTHROPLASTY Right 10/12/2015  . TOTAL KNEE ARTHROPLASTY Right 10/12/2015   Procedure: TOTAL KNEE ARTHROPLASTY;  Surgeon: Frederik Pear, MD;  Location: [facility];  Service: Orthopedics;  Laterality: Right;    Family History  Problem Relation Age of Onset  . Hypertension Mother   . Stroke Mother   . Kidney failure Mother   . Aneurysm Mother        brain   . Alcohol abuse Father   . Hypertension Father   . Heart attack Father   . Kidney disease Maternal Grandmother   . Kidney disease Maternal Grandfather   . Kidney disease Paternal Grandmother   . Kidney disease Paternal Grandfather    Social History   Tobacco Use  . Smoking status: Never Smoker  . Smokeless tobacco: Never Used  Substance Use Topics  . Alcohol use: No  . Drug use: No    Review of Systems:   Pertinent items are noted in the HPI. Otherwise, ROS is negative.  Objective:   BP 116/72   Pulse 76   Temp 98.2 F (36.8 C) (Oral)   Ht 5\' 8"  (1.727 m)   Wt 264 lb 9.6 oz (120 kg)   SpO2 97%   BMI 40.23 kg/m    Wt Readings from Last 3 Encounters:  01/23/18 264 lb 9.6 oz (120 kg)  12/28/17 269 lb 6.4 oz (122.2 kg)  04/14/17 261 lb 6.4 oz (118.6 kg)     Ht Readings from Last 3 Encounters:  01/23/18 5\' 8"  (1.727 m)  12/28/17 5\' 8"  (1.727 m)  04/14/17 5\' 8"  (1.727 m)   General appearance: alert, cooperative and  appears stated age. Head: normocephalic, without obvious abnormality, atraumatic. Neck: no adenopathy, supple, symmetrical, trachea midline; thyroid not enlarged, symmetric, no tenderness/mass/nodules. Lungs: clear to auscultation bilaterally. Heart: regular rate and rhythm Abdomen: soft, non-tender; no masses,  no organomegaly. Extremities: extremities normal, atraumatic, no cyanosis or edema. Skin: skin color, texture, turgor normal, no rashes or lesions. Lymph: cervical, supraclavicular, and axillary nodes normal; no abnormal inguinal nodes palpated. Neurologic: grossly normal.  Pelvic:  External genitalia: no lesions.              Urethra: normal appearing urethra with no masses, tenderness or lesions.              Bartholins and Skenes: normal.               Vagina: normal appearing vagina with normal color and discharge, no lesions.              Cervix: normal appearance.              Pap and high risk HPV testing done: Yes.          Bimanual  Exam:    Uterus: uterus is normal size, shape, consistency and nontender.                                      Adnexa: normal adnexa in size, nontender and no masses.                                      Assessment/Plan:   Lisa Mcconnell was seen today for annual exam.  Diagnoses and all orders for this visit:  Routine physical examination  Screening for cervical cancer -     Cytology - PAP  Breast cancer screening by mammogram -     MM SCREENING BREAST TOMO BILATERAL; Future  Morbid obesity ([place])  Pure hypercholesterolemia  Other orders -     aspirin EC 81 MG tablet; Take 1 tablet (81 mg total) by mouth daily.   Patient Counseling: [x]    Nutrition: Stressed importance of moderation in sodium/caffeine intake, saturated fat and cholesterol, caloric balance, sufficient intake of fresh fruits, vegetables, fiber, calcium, iron, and 1 mg of folate supplement per day (for females capable of pregnancy).  [x]    Stressed the importance of regular  exercise.   [x]    Substance Abuse: Discussed cessation/primary prevention of tobacco, alcohol, or other drug use; driving or other dangerous activities under the influence; availability of treatment for abuse.   [x]    Injury prevention: Discussed safety belts, safety helmets, smoke detector, smoking near bedding or upholstery.   [x]    Sexuality: Discussed sexually transmitted diseases, partner selection, use of condoms, avoidance of unintended pregnancy  and contraceptive alternatives.  [x]    Dental health: Discussed importance of regular tooth brushing, flossing, and dental visits.  [x]    Health maintenance and immunizations reviewed. Please refer to Health maintenance section.   Briscoe Deutscher, DO [place]

## 2018-01-23 NOTE — Patient Instructions (Signed)
MANAGEMENT OF CHRONIC CONSTIPATION   Push fluids, all day, everyday  Eat lots of high fiber foods-fruits, veggies, bran and whole grain instead of white bread  Be active everyday. Inactivity makes constipation worse.  Add psyllium daily (Metamucil) which comes in capsules now. Start very low dose and work up to recommended dose on bottle daily.  Stay away from Milk of Magnesia or any magnesium containing laxative, unless you need it to clear things out rarely. It is an addictive laxative and your gut will become dependent on it.  If that is not working, I would start Miralax, which you can buy in generic 17 gms daily. It's a powder and not an "addictive laxative". Take it every day and titrate the dose up or down to get the daily BM.  

## 2018-01-24 LAB — CYTOLOGY - PAP
Diagnosis: NEGATIVE
HPV: NOT DETECTED

## 2018-01-28 ENCOUNTER — Encounter: Payer: Self-pay | Admitting: Family Medicine

## 2018-02-01 ENCOUNTER — Other Ambulatory Visit: Payer: Self-pay | Admitting: Family Medicine

## 2018-07-13 ENCOUNTER — Other Ambulatory Visit: Payer: Self-pay

## 2018-07-13 DIAGNOSIS — M546 Pain in thoracic spine: Secondary | ICD-10-CM | POA: Insufficient documentation

## 2018-07-13 DIAGNOSIS — I10 Essential (primary) hypertension: Secondary | ICD-10-CM | POA: Diagnosis not present

## 2018-07-13 DIAGNOSIS — Y929 Unspecified place or not applicable: Secondary | ICD-10-CM | POA: Insufficient documentation

## 2018-07-13 DIAGNOSIS — S161XXA Strain of muscle, fascia and tendon at neck level, initial encounter: Secondary | ICD-10-CM | POA: Diagnosis not present

## 2018-07-13 DIAGNOSIS — S199XXA Unspecified injury of neck, initial encounter: Secondary | ICD-10-CM | POA: Diagnosis not present

## 2018-07-13 DIAGNOSIS — R51 Headache: Secondary | ICD-10-CM | POA: Diagnosis not present

## 2018-07-13 DIAGNOSIS — S0990XA Unspecified injury of head, initial encounter: Secondary | ICD-10-CM | POA: Diagnosis not present

## 2018-07-13 DIAGNOSIS — Y939 Activity, unspecified: Secondary | ICD-10-CM | POA: Insufficient documentation

## 2018-07-13 DIAGNOSIS — M542 Cervicalgia: Secondary | ICD-10-CM | POA: Diagnosis not present

## 2018-07-13 DIAGNOSIS — Z79899 Other long term (current) drug therapy: Secondary | ICD-10-CM | POA: Insufficient documentation

## 2018-07-13 DIAGNOSIS — Y999 Unspecified external cause status: Secondary | ICD-10-CM | POA: Diagnosis not present

## 2018-07-13 DIAGNOSIS — S299XXA Unspecified injury of thorax, initial encounter: Secondary | ICD-10-CM | POA: Diagnosis not present
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
## 2018-07-14 ENCOUNTER — Emergency Department (HOSPITAL_COMMUNITY): Payer: BLUE CROSS/BLUE SHIELD

## 2018-07-14 ENCOUNTER — Emergency Department (HOSPITAL_COMMUNITY)
Admission: EM | Admit: 2018-07-14 | Discharge: 2018-07-14 | Disposition: A | Discharge status: Home / Self Care | Payer: BLUE CROSS/BLUE SHIELD | Attending: Emergency Medicine | Admitting: Emergency Medicine

## 2018-07-14 DIAGNOSIS — S299XXA Unspecified injury of thorax, initial encounter: Secondary | ICD-10-CM | POA: Diagnosis not present

## 2018-07-14 DIAGNOSIS — S161XXA Strain of muscle, fascia and tendon at neck level, initial encounter: Secondary | ICD-10-CM

## 2018-07-14 DIAGNOSIS — M546 Pain in thoracic spine: Secondary | ICD-10-CM

## 2018-07-14 DIAGNOSIS — S199XXA Unspecified injury of neck, initial encounter: Secondary | ICD-10-CM | POA: Diagnosis not present

## 2018-07-14 DIAGNOSIS — S0990XA Unspecified injury of head, initial encounter: Secondary | ICD-10-CM | POA: Diagnosis not present

## 2018-07-14 MED ORDER — HYDROCODONE-ACETAMINOPHEN 5-325 MG PO TABS: 1.0000 | ORAL_TABLET | Freq: Four times a day (QID) | ORAL | 0 refills | Status: DC | PRN

## 2018-07-14 MED ORDER — HYDROCODONE-ACETAMINOPHEN 5-325 MG PO TABS
1.0000 | ORAL_TABLET | Freq: Once | ORAL | Status: AC
  Administered 2018-07-14: 1 via ORAL
  Filled 2018-07-14: qty 1

## 2018-07-14 MED ORDER — IBUPROFEN 600 MG PO TABS: 600.0000 mg | ORAL_TABLET | Freq: Four times a day (QID) | ORAL | 0 refills | Status: DC | PRN

## 2018-07-14 MED ORDER — METHOCARBAMOL 500 MG PO TABS: 500.0000 mg | ORAL_TABLET | Freq: Two times a day (BID) | ORAL | 0 refills | Status: DC | PRN

## 2018-07-14 MED ORDER — NAPROXEN 500 MG PO TABS
500.0000 mg | ORAL_TABLET | Freq: Once | ORAL | Status: AC
  Administered 2018-07-14: 500 mg via ORAL
  Filled 2018-07-14: qty 1

## 2018-07-14 NOTE — ED Notes (Addendum)
Introduced myself to patient and family member at bedside before completing assessment. Family member at bedside asked for my first and last name and stated "I took a picture of you and that other nurse just sitting at the desk" because they are "unhappy with wait time." Informed patient and family member about policy on taking photos and videos of staff members.
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
## 2018-07-14 NOTE — ED Notes (Signed)
Patient transported to CT 

## 2018-07-14 NOTE — ED Notes (Signed)
Patient ambulatory to Xray.

## 2018-09-03 ENCOUNTER — Other Ambulatory Visit: Payer: Self-pay | Admitting: Family Medicine

## 2018-10-09 ENCOUNTER — Other Ambulatory Visit: Payer: Self-pay | Admitting: Family Medicine

## 2018-10-16 MED ORDER — AMLODIPINE BESYLATE 5 MG PO TABS: 5.0000 mg | ORAL_TABLET | Freq: Every day | ORAL | 0 refills | Status: DC

## 2018-10-16 NOTE — Telephone Encounter (Signed)
Pt called and scheduled medication refill OV and wants to know if medication can be called in now.  Pt states she has been without for 4 days now.

## 2018-10-16 NOTE — Addendum Note (Signed)
Addended by: Matilde Sprang on: 10/16/2018 10:26 AM   Modules accepted: Orders

## 2018-10-16 NOTE — Telephone Encounter (Signed)
Courtesy refill until appointment on 10/30/18.

## 2018-10-30 ENCOUNTER — Ambulatory Visit: Payer: BLUE CROSS/BLUE SHIELD | Admitting: Family Medicine

## 2018-10-30 DIAGNOSIS — E88819 Insulin resistance, unspecified: Secondary | ICD-10-CM | POA: Insufficient documentation

## 2018-10-30 DIAGNOSIS — E8881 Metabolic syndrome: Secondary | ICD-10-CM | POA: Insufficient documentation

## 2018-10-30 NOTE — Progress Notes (Deleted)
Lisa Mcconnell is a 62 y.o. female is here for follow up.  History of Present Illness:   {CMA SCRIBE ATTESTATION}  HPI:   Health Maintenance Due  Topic Date Due  . MAMMOGRAM  08/24/2017  . TETANUS/TDAP  02/24/2018  . COLONOSCOPY  09/19/2018   Depression screen PHQ 2/9 12/28/2017  Decreased Interest 0  Down, Depressed, Hopeless 0  PHQ - 2 Score 0   PMHx, SurgHx, SocialHx, FamHx, Medications, and Allergies were reviewed in the Visit Navigator and updated as appropriate.   Patient Active Problem List   Diagnosis Date Noted  . Pure hypercholesterolemia 12/29/2017  . ASCVD (arteriosclerotic cardiovascular disease) 12/29/2017  . Morbid obesity ([place]) 12/29/2017  . Deficiency anemia 07/20/2016  . Arthritis, senescent 07/20/2016  . Visit for screening mammogram 07/20/2016  . Hyperglycemia 07/20/2016  . Primary osteoarthritis of right knee 10/11/2015  . Hypertension 10/06/2008  . Constipation 09/16/2008   Social History   Tobacco Use  . Smoking status: Never Smoker  . Smokeless tobacco: Never Used  Substance Use Topics  . Alcohol use: No  . Drug use: No   Current Medications and Allergies:   Current Outpatient Medications:  .  acetaminophen (TYLENOL) 500 MG tablet, Take 1,000 mg by mouth every 6 (six) hours as needed for moderate pain., Disp: , Rfl:  .  amLODipine (NORVASC) 5 MG tablet, Take 1 tablet (5 mg total) by mouth daily., Disp: 14 tablet, Rfl: 0 .  aspirin EC 81 MG tablet, Take 1 tablet (81 mg total) by mouth daily., Disp: 90 tablet, Rfl: 0 .  HYDROcodone-acetaminophen (NORCO/VICODIN) 5-325 MG tablet, Take 1 tablet by mouth every 6 (six) hours as needed for severe pain., Disp: 7 tablet, Rfl: 0 .  ibuprofen (ADVIL,MOTRIN) 600 MG tablet, Take 1 tablet (600 mg total) by mouth every 6 (six) hours as needed for mild pain or moderate pain., Disp: 30 tablet, Rfl: 0 .  losartan-hydrochlorothiazide (HYZAAR) 100-25 MG tablet, Take 1 tablet by mouth daily., Disp: 90 tablet,  Rfl: 3 .  methocarbamol (ROBAXIN) 500 MG tablet, Take 1 tablet (500 mg total) by mouth every 12 (twelve) hours as needed for muscle spasms., Disp: 20 tablet, Rfl: 0 .  pravastatin (PRAVACHOL) 20 MG tablet, Take 1 tablet (20 mg total) by mouth daily., Disp: 90 tablet, Rfl: 3  Allergies  Allergen Reactions  . Benazepril Hcl Cough   Review of Systems   Pertinent items are noted in the HPI. Otherwise, a complete ROS is negative.  Vitals:  There were no vitals filed for this visit.   There is no height or weight on file to calculate BMI.  Physical Exam:   Physical Exam  Results for orders placed or performed in visit on 01/23/18  Cytology - PAP  Result Value Ref Range   Adequacy      Satisfactory for evaluation  endocervical/transformation zone component PRESENT.   Diagnosis      NEGATIVE FOR INTRAEPITHELIAL LESIONS OR MALIGNANCY.   HPV NOT DETECTED    Material Submitted CervicoVaginal Pap [ThinPrep Imaged]    CYTOLOGY - PAP PAP RESULT     Assessment and Plan:   There are no diagnoses linked to this encounter.  . Orders and follow up as documented in [place], reviewed diet, exercise and weight control, cardiovascular risk and specific lipid/LDL goals reviewed, reviewed medications and side effects in detail.  . Reviewed expectations re: course of current medical issues. . Outlined signs and symptoms indicating need for more acute intervention. . Patient  verbalized understanding and all questions were answered. . Patient received an After Visit Summary.  *** CMA served as Education administrator during this visit. History, Physical, and Plan performed by medical provider. The above documentation has been reviewed and is accurate and complete. Briscoe Deutscher, D.O.  Briscoe Deutscher, DO [place], [facility] 10/30/2018

## 2018-11-11 ENCOUNTER — Encounter: Payer: Self-pay | Admitting: Gastroenterology

## 2018-11-16 ENCOUNTER — Other Ambulatory Visit: Payer: Self-pay | Admitting: Family Medicine

## 2018-11-16 MED ORDER — AMLODIPINE BESYLATE 5 MG PO TABS: 5.0000 mg | ORAL_TABLET | Freq: Every day | ORAL | 0 refills | Status: DC

## 2018-11-16 NOTE — Telephone Encounter (Signed)
Copied from [place] [id number]. Topic: Quick Communication - Rx Refill/Question >> Nov 16, 2018 10:00 AM Sheran Luz wrote: Medication: amLODipine (NORVASC) 5 MG tablet   Patient is requesting a partial refill of this medication- Patient is scheduled for appointment on 01/15. Please advise.   Preferred Pharmacy (with phone number or street name): [facility], [place].  [phone] (Phone) [phone] (Fax)

## 2018-11-21 ENCOUNTER — Ambulatory Visit (INDEPENDENT_AMBULATORY_CARE_PROVIDER_SITE_OTHER): Payer: BLUE CROSS/BLUE SHIELD | Admitting: Family Medicine

## 2018-11-21 ENCOUNTER — Encounter: Payer: Self-pay | Admitting: Family Medicine

## 2018-11-21 VITALS — BP 140/60 | HR 81 | Temp 98.6°F | Ht 67.0 in | Wt 279.0 lb

## 2018-11-21 DIAGNOSIS — E8881 Metabolic syndrome: Secondary | ICD-10-CM | POA: Diagnosis not present

## 2018-11-21 DIAGNOSIS — I10 Essential (primary) hypertension: Secondary | ICD-10-CM

## 2018-11-21 DIAGNOSIS — E88819 Insulin resistance, unspecified: Secondary | ICD-10-CM

## 2018-11-21 DIAGNOSIS — Z1211 Encounter for screening for malignant neoplasm of colon: Secondary | ICD-10-CM

## 2018-11-21 DIAGNOSIS — Z23 Encounter for immunization: Secondary | ICD-10-CM

## 2018-11-21 DIAGNOSIS — E7800 Pure hypercholesterolemia, unspecified: Secondary | ICD-10-CM | POA: Diagnosis not present

## 2018-11-21 MED ORDER — PRAVASTATIN SODIUM 20 MG PO TABS: 20.0000 mg | ORAL_TABLET | Freq: Every day | ORAL | 3 refills | Status: DC

## 2018-11-21 MED ORDER — AMLODIPINE BESYLATE 5 MG PO TABS: 5.0000 mg | ORAL_TABLET | Freq: Every day | ORAL | 1 refills | Status: DC

## 2018-11-21 MED ORDER — LOSARTAN POTASSIUM-HCTZ 100-25 MG PO TABS: 1.0000 | ORAL_TABLET | Freq: Every day | ORAL | 3 refills | Status: DC

## 2018-11-21 MED ORDER — ASPIRIN EC 81 MG PO TBEC: 81.0000 mg | DELAYED_RELEASE_TABLET | Freq: Every day | ORAL | 1 refills | Status: AC

## 2018-11-21 NOTE — Patient Instructions (Addendum)
You have an appointment scheduled for: []   2D Mammogram  [x]   3D Mammogram  []   Bone Density   On  12/21/2018          At  6:50 Am   Your appointment will at the following location  [x]   [facility]      [place], [place]         []   [facility]  [place], [place]   Make sure to wear two peace clothing  No lotions powders or deodorants the day of the appointment Make sure to bring picture ID and insurance card.  Bring list of medications you are currently taking including any supplements.    ....Marland KitchenVaccine Information Statement   Tdap (Tetanus, Diphtheria, Pertussis) Vaccine: What You Need to Know  Many Vaccine Information Statements are available in Spanish and other languages. See AbsolutelyGenuine.com.br. Hojas de Informacin Sobre Vacunas estn disponibles en espaol y en muchos otros idiomas. Visite https://www.martin.org/  1. Why get vaccinated?  Tetanus, diphtheria, and pertussis are very serious diseases. Tdap vaccine can protect [place] from these diseases.  And, Tdap vaccine given to pregnant women can protect newborn babies against pertussis.  TETANUS (Lockjaw) is rare in the [place] today. It causes painful muscle tightening and stiffness, usually all over the body. . It can lead to tightening of muscles in the head and neck so you can't open your mouth, swallow, or sometimes even breathe. Tetanus kills about 1 out of 10 people who are infected even after receiving the best medical care.    DIPHTHERIA is also rare in the [place] today.  It can cause a thick coating to form in the back of the throat. . It can lead to breathing problems, heart failure, paralysis, and death.  PERTUSSIS (Whooping Cough) causes severe coughing spells, which can cause difficulty breathing, vomiting, and disturbed sleep. . It can also lead to weight loss, incontinence, and  rib fractures. Up to 2 in 100 adolescents and 5 in 100 adults with pertussis are hospitalized or have complications, which could include pneumonia or death.   These diseases are caused by bacteria. Diphtheria and pertussis are spread from person to person through secretions from coughing or sneezing. Tetanus enters the body through cuts, scratches, or wounds.  Before vaccines, as many as 200,000 cases of diphtheria, 200,000 cases of pertussis, and hundreds of cases of tetanus, were reported in the [place] each year. Since vaccination began, reports of cases for tetanus and diphtheria have dropped by about 99% and for pertussis by about 80%.  2. Tdap vaccine  Tdap vaccine can protect adolescents and adults from tetanus, diphtheria, and pertussis. One dose of Tdap is routinely given at age 64 or 48.  People who did not get Tdap at that age should get it as soon as possible.  Tdap is especially important for health care professionals and anyone having close contact with a baby younger than 12 months.    Pregnant women should get a dose of Tdap during every pregnancy, to protect the newborn from pertussis.  Infants are most at risk for severe, life-threatening complications from pertussis.  Another vaccine, called Td, protects against tetanus and diphtheria, but not pertussis. A Td booster should be given every 10 years. Tdap may be given as one of these boosters if you have  never gotten Tdap before.  Tdap may also be given after a severe cut or burn to prevent tetanus infection.  Your doctor or the person giving you the vaccine can give you more information.  Tdap may safely be given at the same time as other vaccines.  3. Some people should not get this vaccine  ; A person who has ever had a life-threatening allergic reaction after a previous dose of any diphtheria, tetanus or pertussis containing vaccine, OR has a severe allergy to any part of this vaccine, should not get Tdap vaccine.   Tell the person giving the vaccine about any severe allergies.  ; Anyone who had coma or long repeated seizures within 7 days after a childhood dose of DTP or DTaP, or a previous dose of Tdap, should not get Tdap, unless a cause other than the vaccine was found.  They can still get Td.  ; Talk to your doctor if you: - have seizures or another nervous system problem, - had severe pain or swelling after any vaccine containing diphtheria, tetanus or pertussis,  - ever had a condition called Guillain Barr Syndrome (GBS), - aren't feeling well on the day the shot is scheduled.  4. Risks  With any medicine, including vaccines, there is a chance of side effects. These are usually mild and go away on their own. Serious reactions are also possible but are rare.   Most people who get Tdap vaccine do not have any problems with it.  Mild Problems following Tdap (Did not interfere with activities) ; Pain where the shot was given (about 3 in 4 adolescents or 2 in 3 adults) ; Redness or swelling where the shot was given (about 1 person in 5) ; Mild fever of at least 100.50F (up to about 1 in 25 adolescents or 1 in 100 adults) ; Headache (about 3 or 4 people in 10) ; Tiredness (about 1 person in 3 or 4) ; Nausea, vomiting, diarrhea, stomach ache (up to 1 in 4 adolescents or 1 in 10 adults) ; Chills,  sore joints (about 1 person in 10) ; Body aches (about 1 person in 3 or 4)  ; Rash, swollen glands (uncommon)  Moderate Problems following Tdap (Interfered with activities, but did not require medical attention) ; Pain where the shot was given (up to 1 in 5 or 6)  ; Redness or swelling where the shot was given (up to about 1 in 16 adolescents or 1 in 12 adults) ; Fever over 102F (about 1 in 100 adolescents or 1 in 250 adults) ; Headache (about 1 in 7 adolescents or 1 in 10 adults) ; Nausea, vomiting, diarrhea, stomach ache (up to 1 or 3 people in 100) ; Swelling of the entire arm where the shot was  given (up to about 1 in 500).   Severe Problems following Tdap (Unable to perform usual activities; required medical attention) ; Swelling, severe pain, bleeding, and redness in the arm where the shot was given (rare).  Problems that could happen after any vaccine:  ; People sometimes faint after a medical procedure, including vaccination. Sitting or lying down for about 15 minutes can help prevent fainting, and injuries caused by a fall. Tell your doctor if you feel dizzy, or have vision changes or ringing in the ears.  ; Some people get severe pain in the shoulder and have difficulty moving the arm where a shot was given. This happens very rarely.  ; Any medication can cause a  severe allergic reaction. Such reactions from a vaccine are very rare, estimated at fewer than 1 in a million doses, and would happen within a few minutes to a few hours after the vaccination.   As with any medicine, there is a very remote chance of a vaccine causing a serious injury or death.  The safety of vaccines is always being monitored. For more information, visit: http://www.aguilar.org/  5. What if there is a serious problem?  What should I look for? ; Look for anything that concerns you, such as signs of a severe allergic reaction, very high fever, or unusual behavior.  ; Signs of a severe allergic reaction can include hives, swelling of the face and throat, difficulty breathing, a fast heartbeat, dizziness, and weakness. These would usually start a few minutes to a few hours after the vaccination.  What should I do? ; If you think it is a severe allergic reaction or other emergency that can't wait, call 9-1-1 or get the person to the nearest hospital. Otherwise, call your doctor.  ; Afterward, the reaction should be reported to the Vaccine Adverse Event Reporting System (VAERS). Your doctor might file this report, or you can do it yourself through the VAERS web site at www.vaers.SamedayNews.es, or by  calling 581-737-5691.  VAERS does not give medical advice.  6. The National Vaccine Injury Compensation Program  The [employer] Vaccine Injury Compensation Program (VICP) is a federal program that was created to compensate people who may have been injured by certain vaccines.  Persons who believe they may have been injured by a vaccine can learn about the program and about filing a claim by calling 917-142-1850 or visiting the [place] website at GoldCloset.com.ee. There is a time limit to file a claim for compensation.   7. How can I learn more?  ; Ask your doctor. He or she can give you the vaccine package insert or suggest other sources of information. ; Call your local or state health department. ; Contact the Centers for Disease Control and Prevention (CDC): - Call (304)587-0186 (1-800-CDC-INFO) or - Visit CDC's website at http://hunter.com/   Vaccine Information Statement  Tdap Vaccine (12/31/2013) 42 U.S.C.  [phone]  Department of Health and Geneticist, molecular for Disease Control and Prevention  Office Use Only

## 2018-11-21 NOTE — Progress Notes (Signed)
Lisa Mcconnell is a 63 y.o. female is here for follow up.  History of Present Illness:   Lisa Mcconnell, CMA acting as scribe for Dr. Briscoe Deutscher.   HPI:  Review: taking medications as instructed, no medication side effects noted, no TIAs, no chest pain on exertion, no dyspnea on exertion, no swelling of ankles. Smoker: No.   BP Readings from Last 3 Encounters:  11/21/18 140/60  07/14/18 (!) 150/62  01/23/18 116/72   Lab Results  Component Value Date   CREATININE 0.87 12/28/2017   CREATININE 0.74 08/11/2016   CREATININE 0.73 10/13/2015     Is the patient taking medications without problems? Yes. Does the patient complain of muscle aches?  Yes. Trying to exercise on a regular basis? No. Compliant with diet? Yes.  Lab Results  Component Value Date   CHOL 176 12/28/2017   HDL 60.70 12/28/2017   LDLCALC 101 (H) 12/28/2017   TRIG 73.0 12/28/2017   CHOLHDL 3 12/28/2017   Lab Results  Component Value Date   ALT 7 12/28/2017   AST 21 12/28/2017   ALKPHOS 62 12/28/2017   BILITOT 0.5 12/28/2017     Health Maintenance Due  Topic Date Due  . MAMMOGRAM  08/24/2017  . TETANUS/TDAP  02/24/2018  . COLONOSCOPY  09/19/2018   Depression screen PHQ 2/9 12/28/2017  Decreased Interest 0  Down, Depressed, Hopeless 0  PHQ - 2 Score 0   PMHx, SurgHx, SocialHx, FamHx, Medications, and Allergies were reviewed in the Visit Navigator and updated as appropriate.   Patient Active Problem List   Diagnosis Date Noted  . Insulin resistance 10/30/2018  . Pure hypercholesterolemia, on Pravastatin 12/29/2017  . ASCVD (arteriosclerotic cardiovascular disease) risk 9.5% 12/29/2017  . Morbid obesity ([place]) 12/29/2017  . Primary osteoarthritis of right knee 10/11/2015  . Hypertension, on Hyzaar and Norvasc 10/06/2008   Social History   Tobacco Use  . Smoking status: Never Smoker  . Smokeless tobacco: Never Used  Substance Use Topics  . Alcohol use: No  . Drug use: No   Current Medications  and Allergies:   .  acetaminophen (TYLENOL) 500 MG tablet, Take 1,000 mg by mouth every 6 (six) hours as needed for moderate pain., Disp: , Rfl:  .  amLODipine (NORVASC) 5 MG tablet, Take 1 tablet (5 mg total) by mouth daily., Disp: 14 tablet, Rfl: 0 .  aspirin EC 81 MG tablet, Take 1 tablet (81 mg total) by mouth daily., Disp: 90 tablet, Rfl: 0 .  HYDROcodone-acetaminophen (NORCO/VICODIN) 5-325 MG tablet, Take 1 tablet by mouth every 6 (six) hours as needed for severe pain., Disp: 7 tablet, Rfl: 0 .  ibuprofen (ADVIL,MOTRIN) 600 MG tablet, Take 1 tablet (600 mg total) by mouth every 6 (six) hours as needed for mild pain or moderate pain., Disp: 30 tablet, Rfl: 0 .  losartan-hydrochlorothiazide (HYZAAR) 100-25 MG tablet, Take 1 tablet by mouth daily., Disp: 90 tablet, Rfl: 3 .  methocarbamol (ROBAXIN) 500 MG tablet, Take 1 tablet (500 mg total) by mouth every 12 (twelve) hours as needed for muscle spasms., Disp: 20 tablet, Rfl: 0 .  pravastatin (PRAVACHOL) 20 MG tablet, Take 1 tablet (20 mg total) by mouth daily., Disp: 90 tablet, Rfl: 3   Allergies  Allergen Reactions  . Benazepril Hcl Cough   Review of Systems   Pertinent items are noted in the HPI. Otherwise, a complete ROS is negative.  Vitals:   Vitals:   11/21/18 1046  BP: 140/60  Pulse: 81  Temp: 98.6 F (37 C)  TempSrc: Oral  SpO2: 96%  Weight: 279 lb (126.6 kg)  Height: 5\' 7"  (1.702 m)     Body mass index is 43.7 kg/m.  Physical Exam:   Physical Exam Vitals signs and nursing note reviewed.  Constitutional:      General: She is not in acute distress.    Appearance: She is well-developed.  HENT:     Head: Normocephalic and atraumatic.     Right Ear: External ear normal.     Left Ear: External ear normal.     Nose: Nose normal.  Eyes:     Conjunctiva/sclera: Conjunctivae normal.     Pupils: Pupils are equal, round, and reactive to light.  Neck:     Musculoskeletal: Normal range of motion and neck supple.      Thyroid: No thyromegaly.  Cardiovascular:     Rate and Rhythm: Normal rate and regular rhythm.     Heart sounds: Normal heart sounds.  Pulmonary:     Effort: Pulmonary effort is normal.     Breath sounds: Normal breath sounds.  Abdominal:     General: Bowel sounds are normal.     Palpations: Abdomen is soft.  Musculoskeletal: Normal range of motion.  Lymphadenopathy:     Cervical: No cervical adenopathy.  Skin:    General: Skin is warm and dry.     Capillary Refill: Capillary refill takes less than 2 seconds.  Neurological:     Mental Status: She is alert and oriented to person, place, and time.  Psychiatric:        Behavior: Behavior normal.    Assessment and Plan:   Lisa Mcconnell was seen today for follow-up.  Diagnoses and all orders for this visit:  Essential hypertension -     losartan-hydrochlorothiazide (HYZAAR) 100-25 MG tablet; Take 1 tablet by mouth daily. -     aspirin EC 81 MG tablet; Take 1 tablet (81 mg total) by mouth daily. -     amLODipine (NORVASC) 5 MG tablet; Take 1 tablet (5 mg total) by mouth daily.  Insulin resistance  Pure hypercholesterolemia, on Pravastatin -     pravastatin (PRAVACHOL) 20 MG tablet; Take 1 tablet (20 mg total) by mouth daily.  Morbid obesity ([place])  Screening for colon cancer -     Ambulatory referral to Gastroenterology  Pure hypercholesterolemia -     pravastatin (PRAVACHOL) 20 MG tablet; Take 1 tablet (20 mg total) by mouth daily.  Need for Tdap vaccination -     Tdap vaccine greater than or equal to 7yo IM    . Orders and follow up as documented in [facility], reviewed diet, exercise and weight control, cardiovascular risk and specific lipid/LDL goals reviewed, reviewed medications and side effects in detail.  . Reviewed expectations re: course of current medical issues. . Outlined signs and symptoms indicating need for more acute intervention. . Patient verbalized understanding and all questions were answered. . Patient  received an After Visit Summary.  CMA served as Education administrator during this visit. History, Physical, and Plan performed by medical provider. The above documentation has been reviewed and is accurate and complete. Briscoe Deutscher, D.O.  Briscoe Deutscher, DO , [facility] 11/24/2018

## 2018-12-20 ENCOUNTER — Telehealth: Payer: Self-pay | Admitting: Physician Assistant

## 2018-12-20 ENCOUNTER — Encounter: Payer: Self-pay | Admitting: Physician Assistant

## 2018-12-20 ENCOUNTER — Ambulatory Visit: Payer: Self-pay

## 2018-12-20 ENCOUNTER — Ambulatory Visit (INDEPENDENT_AMBULATORY_CARE_PROVIDER_SITE_OTHER): Payer: BLUE CROSS/BLUE SHIELD | Admitting: Physician Assistant

## 2018-12-20 VITALS — BP 150/80 | HR 86 | Temp 98.7°F | Ht 67.0 in | Wt 278.4 lb

## 2018-12-20 DIAGNOSIS — R6889 Other general symptoms and signs: Secondary | ICD-10-CM

## 2018-12-20 LAB — POCT INFLUENZA A/B
INFLUENZA A, POC: NEGATIVE
Influenza B, POC: NEGATIVE

## 2018-12-20 MED ORDER — AMOXICILLIN-POT CLAVULANATE 875-125 MG PO TABS: 1.0000 | ORAL_TABLET | Freq: Two times a day (BID) | ORAL | 0 refills | Status: DC

## 2018-12-20 MED ORDER — BENZONATATE 100 MG PO CAPS: 100.0000 mg | ORAL_CAPSULE | Freq: Two times a day (BID) | ORAL | 0 refills | Status: DC | PRN

## 2018-12-20 NOTE — Telephone Encounter (Signed)
See note

## 2018-12-20 NOTE — Telephone Encounter (Signed)
Please see message and advise 

## 2018-12-20 NOTE — Telephone Encounter (Signed)
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 Has a headache today that starts in her neck and goes to the top of her head. Sore from coughing. No fever. Has tried Mucinex. Has "some wheezing." Appointment for today. No availability with Dr. Juleen China.   Reason for Disposition . Coughing up rusty-colored (reddish-brown) sputum  Answer Assessment - Initial Assessment Questions 1. ONSET: "When did the cough begin?"      Started Saturday 2. SEVERITY: "How bad is the cough today?"      Severe 3. RESPIRATORY DISTRESS: "Describe your breathing."      No distress 4. FEVER: "Do you have a fever?" If so, ask: "What is your temperature, how was it measured, and when did it start?"     "A little" 5. SPUTUM: "Describe the color of your sputum" (clear, white, yellow, green)     Yellow with flecks of blood 6. HEMOPTYSIS: "Are you coughing up any blood?" If so ask: "How much?" (flecks, streaks, tablespoons, etc.)     Flecks 7. CARDIAC HISTORY: "Do you have any history of heart disease?" (e.g., heart attack, congestive heart failure)      None 8. LUNG HISTORY: "Do you have any history of lung disease?"  (e.g., pulmonary embolus, asthma, emphysema)     Pneumonia last year 36. PE RISK FACTORS: "Do you have a history of blood clots?" (or: recent major surgery, recent prolonged travel, bedridden)     No 10. OTHER SYMPTOMS: "Do you have any other symptoms?" (e.g., runny nose, wheezing, chest pain)       Wheezing 11. PREGNANCY: "Is there any chance you are pregnant?" "When was your last menstrual period?"       No 12. TRAVEL: "Have you traveled out of the country in the last month?" (e.g., travel history, exposures)       No  Protocols used: [place]

## 2018-12-20 NOTE — Patient Instructions (Addendum)
It was great to see you!  Start your blood pressure medication as soon as you get home. Please call us if you blood pressure consistently remains >150/90.  Stop the Amoxicillin and start oral Augmentin instead.  Use Tessalon perles as needed for cough.  Continue plain mucinex with water. Avoid deongestants, they can raise your blood pressure.  Any worsening headache -- go to the ER.  I hope you start feeling better soon!

## 2018-12-20 NOTE — Telephone Encounter (Signed)
Copied from [place] [phone]. Topic: Quick Communication - See Telephone Encounter >> Dec 20, 2018  2:40 PM Sheran Luz wrote: CRM for notification. See Telephone encounter for: 12/20/18.  Patient was seen in office today 12/19/2018, calling back to inquire why no pain medication was sent into pharmacy for her headache. Patient states that she still has a severe headache and would like to know if something can be called into pharmacy. Please advise.

## 2018-12-20 NOTE — Progress Notes (Addendum)
Lisa Mcconnell is a 63 y.o. female here for a new problem.  I acted as a Education administrator for [employer], PA-C Anselmo Pickler, LPN  History of Present Illness:   Chief Complaint  Patient presents with  . Cough    Pt started coughing up blood last night.  . Sore Throat    Starting Sat    Cough  This is a new problem. Episode onset: Started Saturday with sore throat and now cough. The problem has been gradually worsening. The problem occurs every few hours. The cough is productive of sputum (expectorating yelllow sputum but started being blood tinged last night). Associated symptoms include chills, a fever, headaches, nasal congestion (yellow drainage), postnasal drip and a sore throat. Pertinent negatives include no ear congestion or ear pain. The symptoms are aggravated by lying down. Risk factors for lung disease include occupational exposure. Treatments tried: Mucinex. The treatment provided mild relief. Her past medical history is significant for pneumonia. There is no history of asthma or bronchitis.   BP Readings from Last 3 Encounters:  12/20/18 (!) 150/80  11/21/18 140/60  07/14/18 (!) 150/62   [facility] and was told that she had strep. She thinks that she was given antibiotics but she is unsure.  She denies: vision changes, neck stiffness, difficulty breathing, double vision, "worst HA of life."  Past Medical History:  Diagnosis Date  . ACE-inhibitor cough   . Anemia   . Arthritis   . Chronic constipation   . Hemorrhoids   . Hypertension   . Obesity      Social History   Socioeconomic History  . Marital status: Married    Spouse name: Not on file  . Number of children: Not on file  . Years of education: Not on file  . Highest education level: Not on file  Occupational History  . Not on file  Social Needs  . Financial resource strain: Not on file  . Food insecurity:    Worry: Not on file    Inability: Not on file  .  Transportation needs:    Medical: Not on file    Non-medical: Not on file  Tobacco Use  . Smoking status: Never Smoker  . Smokeless tobacco: Never Used  Substance and Sexual Activity  . Alcohol use: No  . Drug use: No  . Sexual activity: Not on file  Lifestyle  . Physical activity:    Days per week: Not on file    Minutes per session: Not on file  . Stress: Not on file  Relationships  . Social connections:    Talks on phone: Not on file    Gets together: Not on file    Attends religious service: Not on file    Active member of club or organization: Not on file    Attends meetings of clubs or organizations: Not on file    Relationship status: Not on file  . Intimate partner violence:    Fear of current or ex partner: Not on file    Emotionally abused: Not on file    Physically abused: Not on file    Forced sexual activity: Not on file  Other Topics Concern  . Not on file  Social History Narrative  . Not on file    Past Surgical History:  Procedure Laterality Date  . CESAREAN SECTION     x 3   . COLONOSCOPY    . left knee replacement  03/2008  . TOTAL KNEE ARTHROPLASTY Right 10/12/2015  . TOTAL KNEE ARTHROPLASTY Right 10/12/2015   Procedure: TOTAL KNEE ARTHROPLASTY;  Surgeon: Frederik Pear, MD;  Location: [facility];  Service: Orthopedics;  Laterality: Right;    Family History  Problem Relation Age of Onset  . Hypertension Mother   . Stroke Mother   . Kidney failure Mother   . Aneurysm Mother   . Alcohol abuse Father   . Hypertension Father   . Heart attack Father   . Kidney disease Maternal Grandmother   . Kidney disease Maternal Grandfather   . Kidney disease Paternal Grandmother   . Kidney disease Paternal Grandfather     Allergies  Allergen Reactions  . Benazepril Hcl Cough    Current Medications:   Current Outpatient Medications:  .  acetaminophen (TYLENOL) 500 MG tablet, Take 1,000 mg by mouth every 6 (six) hours as needed for moderate pain., Disp: ,  Rfl:  .  amLODipine (NORVASC) 5 MG tablet, Take 1 tablet (5 mg total) by mouth daily., Disp: 90 tablet, Rfl: 1 .  aspirin EC 81 MG tablet, Take 1 tablet (81 mg total) by mouth daily., Disp: 90 tablet, Rfl: 1 .  ibuprofen (ADVIL,MOTRIN) 600 MG tablet, Take 1 tablet (600 mg total) by mouth every 6 (six) hours as needed for mild pain or moderate pain., Disp: 30 tablet, Rfl: 0 .  losartan-hydrochlorothiazide (HYZAAR) 100-25 MG tablet, Take 1 tablet by mouth daily., Disp: 90 tablet, Rfl: 3 .  pravastatin (PRAVACHOL) 20 MG tablet, Take 1 tablet (20 mg total) by mouth daily., Disp: 90 tablet, Rfl: 3 .  amoxicillin-clavulanate (AUGMENTIN) 875-125 MG tablet, Take 1 tablet by mouth 2 (two) times daily., Disp: 20 tablet, Rfl: 0 .  benzonatate (TESSALON) 100 MG capsule, Take 1 capsule (100 mg total) by mouth 2 (two) times daily as needed for cough., Disp: 20 capsule, Rfl: 0   Review of Systems:   Review of Systems  Constitutional: Positive for chills and fever.  HENT: Positive for postnasal drip and sore throat. Negative for ear pain.   Respiratory: Positive for cough.   Neurological: Positive for headaches.    Vitals:   Vitals:   12/20/18 1026  BP: (!) 150/80  Pulse: 86  Temp: 98.7 F (37.1 C)  TempSrc: Oral  SpO2: 99%  Weight: 278 lb 6.1 oz (126.3 kg)  Height: 5\' 7"  (1.702 m)     Body mass index is 43.6 kg/m.  Physical Exam:   Physical Exam Vitals signs and nursing note reviewed.  Constitutional:      General: She is not in acute distress.    Appearance: She is well-developed. She is not ill-appearing or toxic-appearing.  HENT:     Head: Normocephalic and atraumatic.     Right Ear: Tympanic membrane, ear canal and external ear normal. Tympanic membrane is not erythematous, retracted or bulging.     Left Ear: Tympanic membrane, ear canal and external ear normal. Tympanic membrane is not erythematous, retracted or bulging.     Nose: Mucosal edema, congestion and rhinorrhea present.      Right Sinus: Maxillary sinus tenderness present. No frontal sinus tenderness.     Left Sinus: Maxillary sinus tenderness present. No frontal sinus tenderness.     Mouth/Throat:     Lips: Pink.     Mouth: Mucous membranes are moist.     Pharynx: Uvula midline. Posterior oropharyngeal erythema present.     Tonsils: No tonsillar exudate. Swelling: 2+ on the right. 2+ on  the left.  Eyes:     General: Lids are normal.     Conjunctiva/sclera: Conjunctivae normal.  Neck:     Trachea: Trachea normal.  Cardiovascular:     Rate and Rhythm: Normal rate and regular rhythm.     Heart sounds: Normal heart sounds, S1 normal and S2 normal.  Pulmonary:     Effort: Pulmonary effort is normal.     Breath sounds: Normal breath sounds. No decreased breath sounds, wheezing, rhonchi or rales.     Comments: No wheezing on my exam. Good lung effort and breathing well. Lymphadenopathy:     Cervical: No cervical adenopathy.  Skin:    General: Skin is warm and dry.  Neurological:     General: No focal deficit present.     Mental Status: She is alert.     GCS: GCS eye subscore is 4. GCS verbal subscore is 5. GCS motor subscore is 6.     Cranial Nerves: Cranial nerves are intact.     Sensory: Sensation is intact.     Motor: Motor function is intact.  Psychiatric:        Speech: Speech normal.        Behavior: Behavior normal. Behavior is cooperative.     Results for orders placed or performed in visit on 12/20/18  POCT Influenza A/B  Result Value Ref Range   Influenza A, POC Negative Negative   Influenza B, POC Negative Negative    Assessment and Plan:   Cieanna was seen today for cough and sore throat.  Diagnoses and all orders for this visit:  Flu-like symptoms -     POCT Influenza A/B  Other orders -     amoxicillin-clavulanate (AUGMENTIN) 875-125 MG tablet; Take 1 tablet by mouth 2 (two) times daily. -     benzonatate (TESSALON) 100 MG capsule; Take 1 capsule (100 mg total) by mouth 2  (two) times daily as needed for cough.   No red flags on exam.  Flu test negative. Unclear if she is presently on antibiotics for presumed strep. Will have her stop amoxicillin and start oral Augmentin. Will initiate tessalon per orders. Reviewed BP guidelines and recommended taking blood pressure medication as directed, reach out to our office if numbers >150/80 consistently. Neuro exam benign today. Avoid decongestants or meds that can raise BP. Discussed taking medications as prescribed. Reviewed return precautions including worsening fever, SOB, worsening cough or other concerns. Push fluids and rest. I recommend that patient follow-up if symptoms worsen or persist despite treatment x 7-10 days, sooner if needed.   . Reviewed expectations re: course of current medical issues. . Discussed self-management of symptoms. . Outlined signs and symptoms indicating need for more acute intervention. . Patient verbalized understanding and all questions were answered. . See orders for this visit as documented in the electronic medical record. . Patient received an After-Visit Summary.  CMA or LPN served as scribe during this visit. History, Physical, and Plan performed by medical provider. The above documentation has been reviewed and is accurate and complete.   Inda Coke, PA-C

## 2018-12-21 ENCOUNTER — Inpatient Hospital Stay: Admission: RE | Admit: 2018-12-21 | Payer: BLUE CROSS/BLUE SHIELD | Source: Ambulatory Visit

## 2018-12-21 ENCOUNTER — Other Ambulatory Visit: Payer: Self-pay | Admitting: Physician Assistant

## 2018-12-21 ENCOUNTER — Emergency Department (HOSPITAL_COMMUNITY)
Admission: EM | Admit: 2018-12-21 | Discharge: 2018-12-21 | Discharge status: Against Medical Advice | Payer: BLUE CROSS/BLUE SHIELD

## 2018-12-21 ENCOUNTER — Ambulatory Visit: Payer: BLUE CROSS/BLUE SHIELD

## 2018-12-21 DIAGNOSIS — R519 Headache, unspecified: Secondary | ICD-10-CM

## 2018-12-21 DIAGNOSIS — R51 Headache: Principal | ICD-10-CM

## 2018-12-21 NOTE — Telephone Encounter (Signed)
Spoke to pt told her Aldona Bar is going to order a Stat CT of her head and someone will be calling her back to tell her where and when. Pt verbalized understanding. Told pt if she is having severe headache, nausea, vomiting or vision changes needs to go to the ER. Pt verbalized understanding. Also in regards to pain medication Aldona Bar said does not want to prescribe anything till we see what is going on with CT scan can take Tylenol for headache. Pt verbalized understanding.

## 2018-12-21 NOTE — Telephone Encounter (Signed)
Please call and triage patient.  When she was here for her visit, she said that her headache had subsided by end of visit, so no medication was called in.   I had advised her to go home and take her blood pressure medication, and keep [place] posted on the readings, as that could have been influencing her headaches.  Have there been any changes since she was in the office?

## 2018-12-21 NOTE — ED Triage Notes (Signed)
Pt here with c/o headache times 2 days , no weakness and blurred vision , no nv/ ,  Pt took tylenol prior to arrival

## 2018-12-21 NOTE — Telephone Encounter (Signed)
Please see message and advise 

## 2018-12-21 NOTE — Telephone Encounter (Signed)
Spoke to pt asked her how she is feeling? Pt said terrible, went to ER at 3:00 AM for headache. Asked pt what they said and what was her blood pressure? Pt said Bp was 178/87 and she did not stay due to 4 hour wait. Asked pt if still having headache? Pt said no, it comes and goes, starts at her neck and goes to top of head. Pt said she told us about it yesterday at visit. Told pt yes, but you said headache was gone when you left and Aldona Bar said for you to go home and take your blood pressure medicine cause you had not taken it yet and keep [place] posted on blood pressure readings. Pt said she called back yesterday for something for pain. Told her sorry we were both out of the office and Aldona Bar saw message today and wanted me to check on you. Pt said she would like some pain medicine for headaches and test ordered to check head. Told pt will discuss with Aldona Bar and get back to her. Pt verbalized understanding.

## 2018-12-21 NOTE — Telephone Encounter (Signed)
Left message on voicemail to call office.  

## 2018-12-21 NOTE — Telephone Encounter (Signed)
Did you get this?

## 2018-12-21 NOTE — Telephone Encounter (Signed)
Returning phone call.   [phone]

## 2018-12-21 NOTE — Telephone Encounter (Signed)
See note

## 2018-12-21 NOTE — Telephone Encounter (Signed)
If patient is having severe headache, nausea, vomiting, vision changes presently, I recommend that she go to the ER.  If she doesn't have a severe headache, we can order a stat CT scan of her head. If there is anything concerning on her scan, I will be calling to tell her to go to the ER.

## 2018-12-28 ENCOUNTER — Encounter: Payer: Self-pay | Admitting: Family Medicine

## 2019-03-20 ENCOUNTER — Ambulatory Visit: Payer: BLUE CROSS/BLUE SHIELD | Admitting: Family Medicine

## 2019-07-12 ENCOUNTER — Telehealth: Payer: Self-pay | Admitting: Physical Therapy

## 2019-07-12 NOTE — Telephone Encounter (Signed)
Copied from [place] [id number]. Topic: General - Other >> Jul 12, 2019  2:48 PM Leward Quan A wrote: Reason for CRM: Patient called to inform Dr Juleen China that the pinched nerve that she once treated with pain medications. Patient states that the pain is back and she need some more pain medication. Ph# [phone]

## 2019-07-12 NOTE — Telephone Encounter (Signed)
Ok to treat or do you want her to come in?

## 2019-07-15 DIAGNOSIS — M79603 Pain in arm, unspecified: Secondary | ICD-10-CM | POA: Diagnosis not present

## 2019-07-15 DIAGNOSIS — R0902 Hypoxemia: Secondary | ICD-10-CM | POA: Diagnosis not present

## 2019-07-15 DIAGNOSIS — I10 Essential (primary) hypertension: Secondary | ICD-10-CM | POA: Diagnosis not present

## 2019-07-15 DIAGNOSIS — R52 Pain, unspecified: Secondary | ICD-10-CM | POA: Diagnosis not present

## 2019-07-15 NOTE — Telephone Encounter (Signed)
Needs visit. Can be with another provider.

## 2019-07-16 ENCOUNTER — Ambulatory Visit: Payer: BLUE CROSS/BLUE SHIELD | Admitting: Family Medicine

## 2019-07-16 DIAGNOSIS — M542 Cervicalgia: Secondary | ICD-10-CM | POA: Diagnosis not present

## 2019-07-16 NOTE — Telephone Encounter (Signed)
Please call patient to get app.

## 2019-12-07 ENCOUNTER — Other Ambulatory Visit: Payer: Self-pay | Admitting: Family Medicine

## 2019-12-07 DIAGNOSIS — E7800 Pure hypercholesterolemia, unspecified: Secondary | ICD-10-CM

## 2019-12-08 ENCOUNTER — Other Ambulatory Visit: Payer: Self-pay | Admitting: Family Medicine

## 2019-12-08 DIAGNOSIS — E7800 Pure hypercholesterolemia, unspecified: Secondary | ICD-10-CM

## 2019-12-09 NOTE — Telephone Encounter (Signed)
Is this your patient now? Looks like you seen her for acute visit? Just not sure if I need to send for ok from someone or send to you. Sorry :-(

## 2019-12-09 NOTE — Telephone Encounter (Signed)
Please review

## 2019-12-10 NOTE — Telephone Encounter (Signed)
Please notify patient that she needs an appointment for further refills. I am only refilling for 30 more days.

## 2019-12-12 NOTE — Telephone Encounter (Signed)
Please call patient to make sure she has app.

## 2019-12-12 NOTE — Telephone Encounter (Signed)
Called and scheduled TOC appt with Dr. Jonni Sanger

## 2019-12-14 ENCOUNTER — Other Ambulatory Visit: Payer: Self-pay | Admitting: Family Medicine

## 2019-12-16 ENCOUNTER — Telehealth: Payer: Self-pay

## 2019-12-16 ENCOUNTER — Telehealth: Payer: Self-pay | Admitting: Family Medicine

## 2019-12-16 ENCOUNTER — Other Ambulatory Visit: Payer: Self-pay

## 2019-12-16 DIAGNOSIS — I10 Essential (primary) hypertension: Secondary | ICD-10-CM

## 2019-12-16 MED ORDER — LOSARTAN POTASSIUM-HCTZ 100-25 MG PO TABS: 1.0000 | ORAL_TABLET | Freq: Every day | ORAL | 3 refills | Status: DC

## 2019-12-16 NOTE — Telephone Encounter (Signed)
Patient does have an on in June to see Dr. Jonni Sanger. Refilled patient's medication.

## 2019-12-16 NOTE — Telephone Encounter (Signed)
Pt called [facility] and stated her feet were swelling. Pt stated she has not taken any BP medication since Monday. Pt states she talked to someone in the office last week and was called in a refill for 30 days. Pt states the pharmacy told her the office denied the medication. Nurse from [facility] advised pt to be seen within 3 days or an urgent care if pt cannot make an appt with PCP. Pt was advised to elevate her legs to relieve the swelling. Nurse called pt and explained to her that the on call MD wanted to see her on Saturday morning, pt refused and said she just wants her medication refilled. Nurse told pt to at least call the office in the AM and see if they would Rx her for a couple of days until she can get in with PCP. Pt still upset. Please advise.

## 2019-12-16 NOTE — Telephone Encounter (Signed)
Patient's medication was sent to pharmacy. Patient notified. She has an appt scheduled  04/15/2020 and would like to be placed on the bump list and notified if Dr. Jonni Sanger has any available appointments before June. Thanks

## 2019-12-16 NOTE — Telephone Encounter (Signed)
Please review

## 2019-12-16 NOTE — Telephone Encounter (Signed)
MEDICATION: losartan-hydrochlorothiazide (HYZAAR) 100-25 MG tablet  PHARMACY: [facility], [place] Phone:  [phone]  Fax:  [phone]      Comments: patient is completely out of blood pressure medication. Patient states that she haven't had medicine in 7 days. Patient needs cholesterol medication as well. Pt unsure name of medication. TOC 04/15/20  **Let patient know to contact pharmacy at the end of the day to make sure medication is ready. **  ** Please notify patient to allow 48-72 hours to process**  **Encourage patient to contact the pharmacy for refills or they can request refills through [facility]**

## 2019-12-16 NOTE — Telephone Encounter (Signed)
Patient is complaining because she state she spoke with someone on Wednesday and was told that they put in a refill request. Pt would like for soemone to call her back when refill is complete  [id number]

## 2020-01-07 NOTE — Progress Notes (Signed)
Lisa Mcconnell is a 64 y.o. female here for a new problem.  I acted as a Education administrator for [employer], PA-C Anselmo Pickler, LPN  History of Present Illness:   Chief Complaint  Patient presents with  . Back Pain    HPI   Back pain Pt c/o low back pain right side and radiates up her back. Started 3 months ago. Pain is sharp at times. Denies urinary symptoms, fever, chills or constipation. Taking Tylenol and had a muscle relaxer her sister gave her and it helped. 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  HTN Currently taking Norvasc 5 mg, Hyzaar 100-25 mg. At home blood pressure readings are: 140/80. Patient denies chest pain, SOB, blurred vision, dizziness, unusual headaches, lower leg swelling. Patient is compliant with medication. Denies excessive caffeine intake, stimulant usage, excessive alcohol intake, or increase in salt consumption.  BP Readings from Last 3 Encounters:  01/08/20 140/80  12/20/18 (!) 150/80  11/21/18 140/60   HLD Currently on pravastatin 20 mg daily. Tolerates without any issues however she has been out of this medication for two months. Due for lipid panel recheck.   Past Medical History:  Diagnosis Date  . ACE-inhibitor cough   . Anemia   . Arthritis   . Chronic constipation   . Hemorrhoids   . Hypertension   . Obesity      Social History   Socioeconomic History  . Marital status: Married    Spouse name: Not on file  . Number of children: Not on file  . Years of education: Not on file  . Highest education level: Not on file  Occupational History  . Not on file  Tobacco Use  . Smoking status: Never Smoker  . Smokeless tobacco: Never Used  Substance and Sexual Activity  . Alcohol use: No  . Drug use: No  . Sexual activity: Not on file  Other Topics Concern  . 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 Difficulty of Paying Living Expenses: Not on file  Food Insecurity:   . Worried About Charity fundraiser in the Last Year: Not on file  . Ran Out of Food in the Last Year: Not on file  Transportation Needs:   . Lack of Transportation (Medical): Not on file  . Lack of Transportation (Non-Medical): Not on file  Physical Activity:   . Days of Exercise per Week: Not on file  . Minutes of Exercise per Session: Not on file  Stress:   . Feeling of Stress : Not on file  Social Connections:   . Frequency of Communication with Friends and Family: Not on file  . Frequency of Social Gatherings with Friends and Family: Not on file  . Attends Religious Services: Not on file  . Active Member of Clubs or Organizations: Not on file  . Attends Archivist Meetings: Not on file  . Marital Status: Not on file  Intimate Partner Violence:   . Fear of Current or Ex-Partner: Not on file  . Emotionally Abused: Not on file  . Physically Abused: Not on file  . Sexually Abused: Not on file    Past Surgical History:  Procedure Laterality Date  . CESAREAN SECTION  x 3   . COLONOSCOPY    . left knee replacement  03/2008  . TOTAL KNEE ARTHROPLASTY Right 10/12/2015  . TOTAL KNEE ARTHROPLASTY Right 10/12/2015   Procedure: TOTAL KNEE ARTHROPLASTY;  Surgeon: Frederik Pear, MD;  Location: [facility];  Service: Orthopedics;  Laterality: Right;    Family History  Problem Relation Age of Onset  . Hypertension Mother   . Stroke Mother   . Kidney failure Mother   . Aneurysm Mother   . Alcohol abuse Father   . Hypertension Father   . Heart attack Father   . Heart disease Maternal Grandmother   . Heart disease Maternal Grandfather   . Heart disease Paternal Grandmother   . Heart disease Paternal Grandfather     Allergies  Allergen Reactions  . Benazepril Hcl Cough    Current Medications:   Current Outpatient Medications:  .   acetaminophen (TYLENOL) 500 MG tablet, Take 1,000 mg by mouth every 6 (six) hours as needed for moderate pain., Disp: , Rfl:  .  amLODipine (NORVASC) 5 MG tablet, Take 1 tablet (5 mg total) by mouth daily., Disp: 90 tablet, Rfl: 1 .  aspirin EC 81 MG tablet, Take 1 tablet (81 mg total) by mouth daily., Disp: 90 tablet, Rfl: 1 .  ibuprofen (ADVIL,MOTRIN) 600 MG tablet, Take 1 tablet (600 mg total) by mouth every 6 (six) hours as needed for mild pain or moderate pain., Disp: 30 tablet, Rfl: 0 .  losartan-hydrochlorothiazide (HYZAAR) 100-25 MG tablet, Take 1 tablet by mouth daily., Disp: 90 tablet, Rfl: 3 .  pravastatin (PRAVACHOL) 20 MG tablet, Take 1 tablet (20 mg total) by mouth daily., Disp: 90 tablet, Rfl: 3 .  baclofen (LIORESAL) 10 MG tablet, Take 1 tablet (10 mg total) by mouth 3 (three) times daily as needed for muscle spasms., Disp: 30 each, Rfl: 0 .  meloxicam (MOBIC) 15 MG tablet, Take 1 tablet (15 mg total) by mouth daily., Disp: 30 tablet, Rfl: 0   Review of Systems:   ROS  Negative unless otherwise specified per HPI.   Vitals:   Vitals:   01/08/20 1031  BP: 140/80  Pulse: 68  Temp: 97.9 F (36.6 C)  TempSrc: Temporal  SpO2: 98%  Weight: 276 lb 8 oz (125.4 kg)  Height: 5\' 7"  (1.702 m)     Body mass index is 43.31 kg/m.  Physical Exam:   Physical Exam Vitals and nursing note reviewed.  Constitutional:      General: She is not in acute distress.    Appearance: She is well-developed. She is not ill-appearing or toxic-appearing.  Cardiovascular:     Rate and Rhythm: Normal rate and regular rhythm.     Pulses: Normal pulses.     Heart sounds: Normal heart sounds, S1 normal and S2 normal.     Comments: No LE edema Pulmonary:     Effort: Pulmonary effort is normal.     Breath sounds: Normal breath sounds.  Musculoskeletal:     Comments: No decreased ROM 2/2 pain with flexion/extension, lateral side bends, or rotation. Reproducible tenderness with deep palpation to R  mid thoracic paraspinal muscles. No bony tenderness. No evidence of erythema, rash or ecchymosis.    Skin:    General: Skin is warm and dry.  Neurological:     Mental Status: She is alert.     GCS: GCS eye subscore is 4. GCS verbal subscore is 5. GCS motor subscore is 6.  Psychiatric:  Speech: Speech normal.        Behavior: Behavior normal. Behavior is cooperative.       Assessment and Plan:   Signa was seen today for back pain.  Diagnoses and all orders for this visit:  Back pain, unspecified back location, unspecified back pain laterality, unspecified chronicity Suspect muscle strain, however due to chronicity, will order imaging per orders below for further evaluation. Mobic and baclofen prescribed, sleepy precautions advised. PT referral placed, further work-up based on clinical response. No red flags on exam or history. -     DG Lumbar Spine Complete; Future -     DG Thoracic Spine W/Swimmers; Future -     Ambulatory referral to Physical Therapy  Pure hypercholesterolemia Update lipid panel, re-calculate ASCVD and then will adjust statin prn.  -     Lipid panel  Essential hypertension Currently well controlled. Continue current regimen, follow-up in 6 months, sooner if concerns.  Other orders -     baclofen (LIORESAL) 10 MG tablet; Take 1 tablet (10 mg total) by mouth 3 (three) times daily as needed for muscle spasms. -     meloxicam (MOBIC) 15 MG tablet; Take 1 tablet (15 mg total) by mouth daily.    . Reviewed expectations re: course of current medical issues. . Discussed self-management of symptoms. . Outlined signs and symptoms indicating need for more acute intervention. . Patient verbalized understanding and all questions were answered. . See orders for this visit as documented in the electronic medical record. . Patient received an After-Visit Summary.  CMA or LPN served as scribe during this visit. History, Physical, and Plan performed by medical  provider. The above documentation has been reviewed and is accurate and complete.   Inda Coke, PA-C

## 2020-01-08 ENCOUNTER — Ambulatory Visit (INDEPENDENT_AMBULATORY_CARE_PROVIDER_SITE_OTHER): Payer: BC Managed Care – PPO | Admitting: Physician Assistant

## 2020-01-08 ENCOUNTER — Other Ambulatory Visit: Payer: Self-pay

## 2020-01-08 ENCOUNTER — Encounter: Payer: Self-pay | Admitting: Physician Assistant

## 2020-01-08 ENCOUNTER — Ambulatory Visit (INDEPENDENT_AMBULATORY_CARE_PROVIDER_SITE_OTHER): Payer: BC Managed Care – PPO

## 2020-01-08 ENCOUNTER — Other Ambulatory Visit: Payer: Self-pay | Admitting: Physician Assistant

## 2020-01-08 VITALS — BP 140/80 | HR 68 | Temp 97.9°F | Ht 67.0 in | Wt 276.5 lb

## 2020-01-08 DIAGNOSIS — I10 Essential (primary) hypertension: Secondary | ICD-10-CM

## 2020-01-08 DIAGNOSIS — E7800 Pure hypercholesterolemia, unspecified: Secondary | ICD-10-CM

## 2020-01-08 DIAGNOSIS — M546 Pain in thoracic spine: Secondary | ICD-10-CM | POA: Diagnosis not present

## 2020-01-08 DIAGNOSIS — M545 Low back pain: Secondary | ICD-10-CM | POA: Diagnosis not present

## 2020-01-08 DIAGNOSIS — M549 Dorsalgia, unspecified: Secondary | ICD-10-CM

## 2020-01-08 LAB — LIPID PANEL
Cholesterol: 212 mg/dL — ABNORMAL HIGH (ref 0–200)
HDL: 68.2 mg/dL (ref >39.00)
LDL Cholesterol: 120 mg/dL — ABNORMAL HIGH (ref 0–99)
NonHDL: 143.6
Total CHOL/HDL Ratio: 3
Triglycerides: 120 mg/dL (ref 0.0–149.0)
VLDL: 24 mg/dL (ref 0.0–40.0)

## 2020-01-08 MED ORDER — BACLOFEN 10 MG PO TABS: 10.0000 mg | ORAL_TABLET | Freq: Three times a day (TID) | ORAL | 0 refills | Status: DC | PRN

## 2020-01-08 MED ORDER — PRAVASTATIN SODIUM 20 MG PO TABS: 20.0000 mg | ORAL_TABLET | Freq: Every day | ORAL | 3 refills | Status: DC

## 2020-01-08 MED ORDER — MELOXICAM 15 MG PO TABS: 15.0000 mg | ORAL_TABLET | Freq: Every day | ORAL | 0 refills | Status: DC

## 2020-01-08 NOTE — Patient Instructions (Signed)
It was great to see you!  1. For your back -- may use baclofen muscle relaxer as needed for spasms, up to 3 times a day. Be careful - may make you sleepy, avoid driving after taking. Take meloxicam daily for inflammation -- this will replace any ibuprofen you are taking.  2. For your cholesterol -- I will be in touch with your cholesterol results and refill your medication at that time.  3. For your borderline elevated blood sugars -- work on diet and exercise and follow up in August.    Take care,  Inda Coke PA-C

## 2020-01-13 ENCOUNTER — Telehealth: Payer: Self-pay | Admitting: Physician Assistant

## 2020-01-13 ENCOUNTER — Telehealth: Payer: Self-pay

## 2020-01-13 NOTE — Telephone Encounter (Signed)
Patient stated that she is returning missed call from our offlice. Patient was schedule for PT appt 01/15/20 at 4pm. Patient states that she never made this appt and was told someone would receiving a call her regarding how much she would be responsible for. appt notes states 10% co insurance and 60 visits. patient requesting the exact price once that is confirmed then she will schedule appt.

## 2020-01-13 NOTE — Telephone Encounter (Signed)
Pt called inquiring what she would have to pay for PT.  I explained that she doesn't have a copay - her insurance company will bill her - she has a 10% coinsurance.  I told her it was hard to give her an exact price - but if she is responsible for 10%, then she should only be billed around $21 - $25 for each visit.  I told her the first visit is $225 and per Lauren, her visits should be around that each time.  I told her if she wanted a more accurate amount of what insurance will bill her, than she needs to call her insurance company to talk to them about it.  I tried explaining that I have no way of knowing what exactly they will cover - so the $21 - $25 may be right or it may be a lot higher.

## 2020-01-15 ENCOUNTER — Ambulatory Visit: Payer: BC Managed Care – PPO | Admitting: Physical Therapy

## 2020-01-21 ENCOUNTER — Other Ambulatory Visit: Payer: Self-pay | Admitting: *Deleted

## 2020-01-21 DIAGNOSIS — I10 Essential (primary) hypertension: Secondary | ICD-10-CM

## 2020-01-21 MED ORDER — AMLODIPINE BESYLATE 5 MG PO TABS: 5.0000 mg | ORAL_TABLET | Freq: Every day | ORAL | 1 refills | Status: DC

## 2020-01-22 ENCOUNTER — Other Ambulatory Visit: Payer: Self-pay

## 2020-02-13 ENCOUNTER — Ambulatory Visit: Payer: BC Managed Care – PPO | Admitting: Internal Medicine

## 2020-02-25 ENCOUNTER — Telehealth: Payer: BC Managed Care – PPO | Admitting: Physician Assistant

## 2020-02-26 DIAGNOSIS — M545 Low back pain: Secondary | ICD-10-CM | POA: Diagnosis not present

## 2020-04-15 ENCOUNTER — Encounter: Payer: BC Managed Care – PPO | Admitting: Family Medicine

## 2020-05-01 ENCOUNTER — Telehealth: Payer: Self-pay | Admitting: Physician Assistant

## 2020-05-01 DIAGNOSIS — I10 Essential (primary) hypertension: Secondary | ICD-10-CM

## 2020-05-01 MED ORDER — IBUPROFEN 600 MG PO TABS: 600.0000 mg | ORAL_TABLET | Freq: Four times a day (QID) | ORAL | 1 refills | Status: DC | PRN

## 2020-05-01 MED ORDER — LOSARTAN POTASSIUM-HCTZ 100-25 MG PO TABS: 1.0000 | ORAL_TABLET | Freq: Every day | ORAL | 1 refills | Status: DC

## 2020-05-01 MED ORDER — AMLODIPINE BESYLATE 5 MG PO TABS: 5.0000 mg | ORAL_TABLET | Freq: Every day | ORAL | 1 refills | Status: DC

## 2020-05-01 NOTE — Telephone Encounter (Signed)
Patient called and stated she spoke with insurance company and they will only cover her medications to be delivered if they are 90 days prescription instead of 30 days she [facility]  She would like the following changed to 90 supply acetaminophen (TYLENOL) 500 MG tablet ibuprofen (ADVIL,MOTRIN) 600 MG tabletlosartan-hydrochlorothiazide (HYZAAR) 100-25 MG tablet amLODipine (NORVASC) 5 MG tablet

## 2020-05-01 NOTE — Telephone Encounter (Signed)
Spoke to pt told her will send Rx's requested to [facility] except Tylenol that is OTC. Pt verbalized understanding. Rx's sent.

## 2020-10-05 DIAGNOSIS — M545 Low back pain, unspecified: Secondary | ICD-10-CM | POA: Diagnosis not present

## 2020-10-05 DIAGNOSIS — M546 Pain in thoracic spine: Secondary | ICD-10-CM | POA: Diagnosis not present

## 2020-10-19 DIAGNOSIS — M47816 Spondylosis without myelopathy or radiculopathy, lumbar region: Secondary | ICD-10-CM | POA: Diagnosis not present

## 2020-10-19 DIAGNOSIS — Z6841 Body Mass Index (BMI) 40.0 and over, adult: Secondary | ICD-10-CM | POA: Diagnosis not present

## 2020-10-19 DIAGNOSIS — M47814 Spondylosis without myelopathy or radiculopathy, thoracic region: Secondary | ICD-10-CM | POA: Diagnosis not present

## 2020-10-20 ENCOUNTER — Other Ambulatory Visit: Payer: Self-pay | Admitting: Physician Assistant

## 2020-10-20 DIAGNOSIS — I10 Essential (primary) hypertension: Secondary | ICD-10-CM

## 2021-01-09 ENCOUNTER — Telehealth: Payer: Self-pay | Admitting: Family Medicine

## 2021-01-09 NOTE — Telephone Encounter (Signed)
Received call from after hours nurse line. Stated patient called and said she had been out of BP meds for 3 days and has been trying to get a refill for 2 weeks. Her home BP yesterday 145/85 despite not being on any medications for 2 days. No chest pain shortness of breath, or other symptoms. Patient has not been seen or had labs in over a year. Given her reasonable BP and lack of symptoms advised her to follow up closely with clinic next week. Will need office visit for in office BP check and labs.   Algis Greenhouse. Jerline Pain, MD 01/09/2021 6:19 PM

## 2021-01-11 ENCOUNTER — Telehealth: Payer: Self-pay

## 2021-01-11 NOTE — Telephone Encounter (Signed)
Patient Name: Lisa Mcconnell Gender: Female DOB: Dec 11, 1955 Age: 65 Y 2 M 2 D Return Phone Number: [phone] (Primary) Address: City/State/Zip: [place] [address] Client [facility] Client Site [place] [facility] [place] Night Physician Briscoe Deutscher- DO Contact Type Call Who Is Calling Patient / Member / Family / Caregiver Call Type Triage / Clinical Relationship To Patient Self Return Phone Number [phone] (Primary) Chief Complaint Prescription Refill or Medication Request (non symptomatic) Reason for Call Medication Question / Request Initial Comment Pt is needing a refill of their blood pressure medication. Additional [facility] [place] [facility] Translation No Nurse Assessment Nurse: Hilary Hertz, RN, Louanne Belton Date/Time (Eastern Time): 01/09/2021 5:33:39 PM Confirm and document reason for call. If symptomatic, describe symptoms. ---Caller states she is needing a refill of their blood pressure medication, has been out for 3 days as of today. Pt denies any symptoms. Reports she has not been eating any salt. Does the patient have any new or worsening symptoms? ---No Nurse: Hilary Hertz, RN, Louanne Belton Date/Time (Eastern Time): 01/09/2021 5:34:30 PM Please select the assessment type ---Refill Does the patient have enough medication to last until the office opens? ---Unable to obtain loaner dose from Pharmacy Does the client directives allow for assistance with medications after hours? ---Yes Was the medication filled within the last 6 months? ---Yes What is the name of the medication, dose and instructions as listed on the bottle? ---Amlodipine .5mg  once daily Name of the physician as listed on the bottle. ---Dr. Juleen China Pharmacy name and phone number where most recently filled. [facility] [phone] Guidelines Guideline Title Affirmed Question  Affirmed Notes Nurse Date/Time Eilene Ghazi Time) Blood Pressure - High Ran out of BP medications Hilary Hertz, RN, Louanne Belton 01/09/2021 5:34:29 PM PLEASE NOTE: All timestamps contained within this report are represented as [place] Standard Time. CONFIDENTIALTY NOTICE: This fax transmission is intended only for the addressee. It contains information that is legally privileged, confidential or otherwise protected from use or disclosure. If you are not the intended recipient, you are strictly prohibited from reviewing, disclosing, copying using or disseminating any of this information or taking any action in reliance on or regarding this information. If you have received this fax in error, please notify us immediately by telephone so that we can arrange for its return to [place]. Phone: [phone], Toll-Free: [phone], Fax: [phone] Page: 2 of 2 Call Id: [id number] [place]. Time Eilene Ghazi Time) Disposition Final User 01/09/2021 5:46:28 PM Pharmacy Call Hilary Hertz, RN, Louanne Belton Reason: Spoke with pharmacist tech Megan 01/09/2021 5:53:38 PM Called On-Call Provider Hilary Hertz, RN, Louanne Belton 01/09/2021 5:43:06 PM Call PCP within 24 Hours Yes Hilary Hertz, RN, [place] Disagree/Comply Comply Caller Understands Yes PreDisposition [place] Advice Given Per Guideline CARE ADVICE given per High Blood Pressure (Adult) guideline. CALL PCP WITHIN 24 HOURS: * Weakness or numbness of the face, arm or leg on one side of the body occurs * Difficulty walking, difficulty talking, or severe headache occurs * Chest pain or difficulty breathing occurs * You become worse Comments User: Louanne Belton, Hilary Hertz, RN Date/Time Eilene Ghazi Time): 01/09/2021 5:41:18 PM BP 145/85 last night User: Louanne Belton, Hilary Hertz, RN Date/Time Eilene Ghazi Time): 01/09/2021 5:43:33 PM [phone] User: Louanne Belton, Hilary Hertz, RN Date/Time Eilene Ghazi Time): 01/09/2021 5:58:11 PM Caller made aware of MD  instructions. Paging DoctorName Phone DateTime Result/Outcome Message Type  Notes Dimas Chyle- MD [phone] 01/09/2021 5:53:38 PM Called On Call Provider - Reached Doctor Paged Dimas Chyle- MD 01/09/2021 5:56:49 PM Spoke with On Call - General Message Result "Call the office on Monday... She needs an office visit.

## 2021-01-11 NOTE — Telephone Encounter (Signed)
Please call patient and offer appointment for this week for her BP if she is not already on the schedule. Thanks!  Aldona Bar

## 2021-01-11 NOTE — Telephone Encounter (Signed)
LVM for pt to call office and schedule appt

## 2021-03-10 ENCOUNTER — Other Ambulatory Visit: Payer: Self-pay

## 2021-03-10 DIAGNOSIS — I10 Essential (primary) hypertension: Secondary | ICD-10-CM

## 2021-03-10 MED ORDER — LOSARTAN POTASSIUM-HCTZ 100-25 MG PO TABS: 1.0000 | ORAL_TABLET | Freq: Every day | ORAL | 0 refills | Status: DC

## 2021-03-10 MED ORDER — AMLODIPINE BESYLATE 5 MG PO TABS: 1.0000 | ORAL_TABLET | Freq: Every day | ORAL | 0 refills | Status: DC

## 2021-03-10 NOTE — Telephone Encounter (Signed)
Left message on voicemail to call office. Need to clarify pharmacy?

## 2021-03-10 NOTE — Telephone Encounter (Signed)
Patient has scheduled for CPE with Sam on 03/30/2021.  Would like to know if BP meds could get sent to pharmacy.  Patient was in a rush to get off the phone. Was unable to confirm Pharmacy or Medication.

## 2021-03-10 NOTE — Telephone Encounter (Signed)
[facility] [place] [place] [facility] [place] Sites Phone:  [phone]  Fax:  [phone]

## 2021-03-10 NOTE — Telephone Encounter (Signed)
Spoke to pt told her I can only send 30 day supply to local pharmacy until she is seen. Pt verbalized understanding. What local pharmacy do you use, [facility]? [facility] [address] [facility] [address]. Told her okay will send Rx's. Pt verbalized understanding.

## 2021-03-30 ENCOUNTER — Encounter: Payer: BC Managed Care – PPO | Admitting: Physician Assistant

## 2021-03-30 ENCOUNTER — Other Ambulatory Visit: Payer: Self-pay

## 2021-03-30 ENCOUNTER — Encounter: Payer: Self-pay | Admitting: Family

## 2021-03-30 ENCOUNTER — Ambulatory Visit (INDEPENDENT_AMBULATORY_CARE_PROVIDER_SITE_OTHER): Payer: BC Managed Care – PPO | Admitting: Family

## 2021-03-30 VITALS — BP 146/78 | HR 68 | Temp 97.6°F | Ht 67.0 in | Wt 278.6 lb

## 2021-03-30 DIAGNOSIS — I10 Essential (primary) hypertension: Secondary | ICD-10-CM

## 2021-03-30 DIAGNOSIS — Z1211 Encounter for screening for malignant neoplasm of colon: Secondary | ICD-10-CM | POA: Diagnosis not present

## 2021-03-30 DIAGNOSIS — Z1231 Encounter for screening mammogram for malignant neoplasm of breast: Secondary | ICD-10-CM

## 2021-03-30 DIAGNOSIS — R2242 Localized swelling, mass and lump, left lower limb: Secondary | ICD-10-CM

## 2021-03-30 DIAGNOSIS — Z0001 Encounter for general adult medical examination with abnormal findings: Secondary | ICD-10-CM

## 2021-03-30 DIAGNOSIS — E7800 Pure hypercholesterolemia, unspecified: Secondary | ICD-10-CM

## 2021-03-30 DIAGNOSIS — Z780 Asymptomatic menopausal state: Secondary | ICD-10-CM

## 2021-03-30 NOTE — Patient Instructions (Signed)
Health Maintenance, Female Adopting a healthy lifestyle and getting preventive care are important in promoting health and wellness. Ask your health care provider about:  The right schedule for you to have regular tests and exams.  Things you can do on your own to prevent diseases and keep yourself healthy. What should I know about diet, weight, and exercise? Eat a healthy diet  Eat a diet that includes plenty of vegetables, fruits, low-fat dairy products, and lean protein.  Do not eat a lot of foods that are high in solid fats, added sugars, or sodium.   Maintain a healthy weight Body mass index (BMI) is used to identify weight problems. It estimates body fat based on height and weight. Your health care provider can help determine your BMI and help you achieve or maintain a healthy weight. Get regular exercise Get regular exercise. This is one of the most important things you can do for your health. Most adults should:  Exercise for at least 150 minutes each week. The exercise should increase your heart rate and make you sweat (moderate-intensity exercise).  Do strengthening exercises at least twice a week. This is in addition to the moderate-intensity exercise.  Spend less time sitting. Even light physical activity can be beneficial. Watch cholesterol and blood lipids Have your blood tested for lipids and cholesterol at 64 years of age, then have this test every 5 years. Have your cholesterol levels checked more often if:  Your lipid or cholesterol levels are high.  You are older than 64 years of age.  You are at high risk for heart disease. What should I know about cancer screening? Depending on your health history and family history, you may need to have cancer screening at various ages. This may include screening for:  Breast cancer.  Cervical cancer.  Colorectal cancer.  Skin cancer.  Lung cancer. What should I know about heart disease, diabetes, and high blood  pressure? Blood pressure and heart disease  High blood pressure causes heart disease and increases the risk of stroke. This is more likely to develop in people who have high blood pressure readings, are of African descent, or are overweight.  Have your blood pressure checked: ? Every 3-5 years if you are 38-35 years of age. ? Every year if you are 19 years old or older. Diabetes Have regular diabetes screenings. This checks your fasting blood sugar level. Have the screening done:  Once every three years after age 64 if you are at a normal weight and have a low risk for diabetes.  More often and at a younger age if you are overweight or have a high risk for diabetes. What should I know about preventing infection? Hepatitis B If you have a higher risk for hepatitis B, you should be screened for this virus. Talk with your health care provider to find out if you are at risk for hepatitis B infection. Hepatitis C Testing is recommended for:  Everyone born from [birth year] through 1964.  Anyone with known risk factors for hepatitis C. Sexually transmitted infections (STIs)  Get screened for STIs, including gonorrhea and chlamydia, if: ? You are sexually active and are younger than 64 years of age. ? You are older than 64 years of age and your health care provider tells you that you are at risk for this type of infection. ? Your sexual activity has changed since you were last screened, and you are at increased risk for chlamydia or gonorrhea. Ask your health care  provider if you are at risk.  Ask your health care provider about whether you are at high risk for HIV. Your health care provider may recommend a prescription medicine to help prevent HIV infection. If you choose to take medicine to prevent HIV, you should first get tested for HIV. You should then be tested every 3 months for as long as you are taking the medicine. Pregnancy  If you are about to stop having your period (premenopausal) and  you may become pregnant, seek counseling before you get pregnant.  Take 400 to 800 micrograms (mcg) of folic acid every day if you become pregnant.  Ask for birth control (contraception) if you want to prevent pregnancy. Osteoporosis and menopause Osteoporosis is a disease in which the bones lose minerals and strength with aging. This can result in bone fractures. If you are 57 years old or older, or if you are at risk for osteoporosis and fractures, ask your health care provider if you should:  Be screened for bone loss.  Take a calcium or vitamin D supplement to lower your risk of fractures.  Be given hormone replacement therapy (HRT) to treat symptoms of menopause. Follow these instructions at home: Lifestyle  Do not use any products that contain nicotine or tobacco, such as cigarettes, e-cigarettes, and chewing tobacco. If you need help quitting, ask your health care provider.  Do not use street drugs.  Do not share needles.  Ask your health care provider for help if you need support or information about quitting drugs. Alcohol use  Do not drink alcohol if: ? Your health care provider tells you not to drink. ? You are pregnant, may be pregnant, or are planning to become pregnant.  If you drink alcohol: ? Limit how much you use to 0-1 drink a day. ? Limit intake if you are breastfeeding.  Be aware of how much alcohol is in your drink. In the [place] one drink equals one 12 oz bottle of beer (355 mL), one 5 oz glass of wine (148 mL), or one 1 oz glass of hard liquor (44 mL). General instructions  Schedule regular health, dental, and eye exams.  Stay current with your vaccines.  Tell your health care provider if: ? You often feel depressed. ? You have ever been abused or do not feel safe at home. Summary  Adopting a healthy lifestyle and getting preventive care are important in promoting health and wellness.  Follow your health care provider's instructions about healthy  diet, exercising, and getting tested or screened for diseases.  Follow your health care provider's instructions on monitoring your cholesterol and blood pressure. This information is not intended to replace advice given to you by your health care provider. Make sure you discuss any questions you have with your health care provider. Document Revised: 10/17/2018 Document Reviewed: 10/17/2018 Elsevier Patient Education  2021 [place] and Cholesterol Restricted Eating Plan Getting too much fat and cholesterol in your diet may cause health problems. Choosing the right foods helps keep your fat and cholesterol at normal levels. This can keep you from getting certain diseases. Your doctor may recommend an eating plan that includes:  Total fat: ______% or less of total calories a day.  Saturated fat: ______% or less of total calories a day.  Cholesterol: less than _________mg a day.  Fiber: ______g a day. What are tips for following this plan? Meal planning  At meals, divide your plate into four equal parts: ? Fill one-half  of your plate with vegetables and green salads. ? Fill one-fourth of your plate with whole grains. ? Fill one-fourth of your plate with low-fat (lean) protein foods.  Eat fish that is high in omega-3 fats at least two times a week. This includes mackerel, tuna, sardines, and salmon.  Eat foods that are high in fiber, such as whole grains, beans, apples, broccoli, carrots, peas, and barley. General tips  Work with your doctor to lose weight if you need to.  Avoid: ? Foods with added sugar. ? Fried foods. ? Foods with partially hydrogenated oils.  Limit alcohol intake to no more than 1 drink a day for nonpregnant women and 2 drinks a day for men. One drink equals 12 oz of beer, 5 oz of wine, or 1 oz of hard liquor.   Reading food labels  Check food labels for: ? Trans fats. ? Partially hydrogenated oils. ? Saturated fat (g) in each  serving. ? Cholesterol (mg) in each serving. ? Fiber (g) in each serving.  Choose foods with healthy fats, such as: ? Monounsaturated fats. ? Polyunsaturated fats. ? Omega-3 fats.  Choose grain products that have whole grains. Look for the word "whole" as the first word in the ingredient list. Cooking  Cook foods using low-fat methods. These include baking, boiling, grilling, and broiling.  Eat more home-cooked foods. Eat at restaurants and buffets less often.  Avoid cooking using saturated fats, such as butter, cream, palm oil, palm kernel oil, and coconut oil. Recommended foods Fruits  All fresh, canned (in natural juice), or frozen fruits. Vegetables  Fresh or frozen vegetables (raw, steamed, roasted, or grilled). Green salads. Grains  Whole grains, such as whole wheat or whole grain breads, crackers, cereals, and pasta. Unsweetened oatmeal, bulgur, barley, quinoa, or brown rice. Corn or whole wheat flour tortillas. Meats and other protein foods  Ground beef (85% or leaner), grass-fed beef, or beef trimmed of fat. Skinless chicken or [place]. Ground chicken or [place]. Pork trimmed of fat. All fish and seafood. Egg whites. Dried beans, peas, or lentils. Unsalted nuts or seeds. Unsalted canned beans. Nut butters without added sugar or oil. Dairy  Low-fat or nonfat dairy products, such as skim or 1% milk, 2% or reduced-fat cheeses, low-fat and fat-free ricotta or cottage cheese, or plain low-fat and nonfat yogurt. Fats and oils  Tub margarine without trans fats. Light or reduced-fat mayonnaise and salad dressings. Avocado. Olive, canola, sesame, or safflower oils. The items listed above may not be a complete list of foods and beverages you can eat. Contact a dietitian for more information.   Foods to avoid Fruits  Canned fruit in heavy syrup. Fruit in cream or butter sauce. Fried fruit. Vegetables  Vegetables cooked in cheese, cream, or butter sauce. Fried  vegetables. Grains  White bread. White pasta. White rice. Cornbread. Bagels, pastries, and croissants. Crackers and snack foods that contain trans fat and hydrogenated oils. Meats and other protein foods  Fatty cuts of meat. Ribs, chicken wings, bacon, sausage, bologna, salami, chitterlings, fatback, hot dogs, bratwurst, and packaged lunch meats. Liver and organ meats. Whole eggs and egg yolks. Chicken and [place] with skin. Fried meat. Dairy  Whole or 2% milk, cream, half-and-half, and cream cheese. Whole milk cheeses. Whole-fat or sweetened yogurt. Full-fat cheeses. Nondairy creamers and whipped toppings. Processed cheese, cheese spreads, and cheese curds. Beverages  Alcohol. Sugar-sweetened drinks such as sodas, lemonade, and fruit drinks. Fats and oils  Butter, stick margarine, lard, shortening, ghee, or bacon fat.  Coconut, palm kernel, and palm oils. Sweets and desserts  Corn syrup, sugars, honey, and molasses. Candy. Jam and jelly. Syrup. Sweetened cereals. Cookies, pies, cakes, donuts, muffins, and ice cream. The items listed above may not be a complete list of foods and beverages you should avoid. Contact a dietitian for more information. Summary  Choosing the right foods helps keep your fat and cholesterol at normal levels. This can keep you from getting certain diseases.  At meals, fill one-half of your plate with vegetables and green salads.  Eat high-fiber foods, like whole grains, beans, apples, carrots, peas, and barley.  Limit added sugar, saturated fats, alcohol, and fried foods. This information is not intended to replace advice given to you by your health care provider. Make sure you discuss any questions you have with your health care provider. Document Revised: 02/26/2020 Document Reviewed: 02/26/2020 Elsevier Patient Education  2021 [employer].

## 2021-03-30 NOTE — Progress Notes (Signed)
Established Patient Office Visit  Subjective:  Patient ID: Lisa Mcconnell, female    DOB: [birth year]  Age: 65 y.o. MRN: [id number]  CC:  Chief Complaint  Patient presents with  . Annual Exam  . Hypertension  . Back Pain    Pt says that pain has increased.     HPI Lisa Mcconnell presents for complete physical exam.  She has a history of hypertension, hyperlipidemia and currently takes medication for her blood pressure but does not take medication for her cholesterol as prescribed.  Does not routinely exercise or follow any particular diet.  Admits to needing to reduce her weight.  She has not had a Pap smear in over 5 years and declines today.  She is willing to have a mammogram, colonoscopy, bone density scan.  She also has concerns of a mass that spot on her left foot for several years it does not cause any pain.  She reports it occurring shortly after she had left knee surgery.  She is sometimes unable to wear certain shoes as a result of it.  Would like a referral to have it removed.  Past Medical History:  Diagnosis Date  . ACE-inhibitor cough   . Anemia   . Arthritis   . Chronic constipation   . Hemorrhoids   . Hypertension   . Obesity     Past Surgical History:  Procedure Laterality Date  . CESAREAN SECTION     x 3   . COLONOSCOPY    . left knee replacement  03/2008  . TOTAL KNEE ARTHROPLASTY Right 10/12/2015  . TOTAL KNEE ARTHROPLASTY Right 10/12/2015   Procedure: TOTAL KNEE ARTHROPLASTY;  Surgeon: Frederik Pear, MD;  Location: [facility];  Service: Orthopedics;  Laterality: Right;    Family History  Problem Relation Age of Onset  . Hypertension Mother   . Stroke Mother   . Kidney failure Mother   . Aneurysm Mother   . Alcohol abuse Father   . Hypertension Father   . Heart attack Father   . Heart disease Maternal Grandmother   . Heart disease Maternal Grandfather   . Heart disease Paternal Grandmother   . Heart disease Paternal Grandfather     Social History    Socioeconomic History  . Marital status: Married    Spouse name: Not on file  . Number of children: Not on file  . Years of education: Not on file  . Highest education level: Not on file  Occupational History  . Not on file  Tobacco Use  . Smoking status: Never Smoker  . Smokeless tobacco: Never Used  Substance and Sexual Activity  . Alcohol use: No  . Drug use: No  . Sexual activity: Not on file  Other Topics Concern  . 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 acetaminophen (TYLENOL) 500 MG tablet Take 1,000 mg by mouth every 6 (six) hours as needed for moderate pain.    Marland Kitchen amLODipine (NORVASC) 5 MG tablet  Take 1 tablet (5 mg total) by mouth daily. 30 tablet 0  . aspirin EC 81 MG tablet Take 1 tablet (81 mg total) by mouth daily. 90 tablet 1  . ibuprofen (ADVIL) 600 MG tablet Take 1 tablet (600 mg total) by mouth every 6 (six) hours as needed for mild pain or moderate pain. 90 tablet 1  . losartan-hydrochlorothiazide (HYZAAR) 100-25 MG tablet Take 1 tablet by mouth daily. 30 tablet 0  . meloxicam (MOBIC) 15 MG tablet Take 1 tablet (15 mg total) by mouth daily. 30 tablet 0  . pravastatin (PRAVACHOL) 20 MG tablet Take 1 tablet (20 mg total) by mouth daily. (Patient not taking: Reported on 03/30/2021) 90 tablet 3  . baclofen (LIORESAL) 10 MG tablet Take 1 tablet (10 mg total) by mouth 3 (three) times daily as needed for muscle spasms. (Patient not taking: Reported on 03/30/2021) 30 each 0   No facility-administered medications prior to visit.     Allergies  Allergen Reactions  . Benazepril Hcl Cough    ROS Review of Systems  Constitutional: Negative.   HENT: Negative.   Eyes: Negative.   Respiratory: Negative.   Cardiovascular: Negative.   Gastrointestinal: Negative.   Endocrine: Negative.   Genitourinary: Negative.   Musculoskeletal: Negative.   Skin:       Mass in the left foot  Allergic/Immunologic: Negative.   Hematological: Negative.   Psychiatric/Behavioral: Negative.   All other systems reviewed and are negative.     Objective:    Physical Exam Vitals and nursing note reviewed.  Constitutional:      Appearance: Normal appearance. She is obese.  HENT:     Head: Normocephalic and atraumatic.     Right Ear: Tympanic membrane and ear canal normal.     Left Ear: Tympanic membrane and ear canal normal.     Nose: Nose normal.     Mouth/Throat:     Mouth: Mucous membranes are dry.  Eyes:     Extraocular Movements: Extraocular movements intact.     Conjunctiva/sclera: Conjunctivae normal.     Pupils: Pupils are equal, round, and reactive to light.  Cardiovascular:     Rate and Rhythm: Normal rate and regular rhythm.  Pulmonary:     Effort: Pulmonary effort is normal.     Breath sounds: Normal breath sounds.  Abdominal:     General: Bowel sounds are normal.     Palpations: Abdomen is soft.     Tenderness: There is no guarding or rebound.  Genitourinary:    Comments: Patient declined Musculoskeletal:        General: Normal range of motion.     Cervical back: Normal range of motion and neck supple.  Skin:    General: Skin is warm and dry.  Neurological:     General: No focal deficit present.     Mental Status: She is alert and oriented to person, place, and time.  Psychiatric:        Mood and Affect: Mood normal.        Behavior: Behavior normal.     BP (!) 146/78   Pulse 68   Temp 97.6 F (36.4 C) (Temporal)   Ht 5\' 7"  (1.702 m)   Wt 278 lb 9.6 oz (126.4 kg)   SpO2 99%   BMI 43.63  kg/m  Wt Readings from Last 3 Encounters:  03/30/21 278 lb 9.6 oz (126.4 kg)  01/08/20 276 lb 8 oz (125.4 kg)  12/20/18 278 lb 6.1 oz (126.3  kg)     Health Maintenance Due  Topic Date Due  . MAMMOGRAM  08/24/2017  . TETANUS/TDAP  02/24/2018  . COLONOSCOPY (Pts 45-3yrs Insurance coverage will need to be confirmed)  09/19/2018  . PAP SMEAR-Modifier  01/23/2021    There are no preventive care reminders to display for this patient.  Lab Results  Component Value Date   TSH 1.60 08/11/2016   Lab Results  Component Value Date   WBC 5.9 12/28/2017   HGB 12.7 12/28/2017   HCT 38.2 12/28/2017   MCV 95.6 12/28/2017   PLT 218.0 12/28/2017   Lab Results  Component Value Date   NA 140 12/28/2017   K 3.9 12/28/2017   CO2 29 12/28/2017   GLUCOSE 96 12/28/2017   BUN 13 12/28/2017   CREATININE 0.87 12/28/2017   BILITOT 0.5 12/28/2017   ALKPHOS 62 12/28/2017   AST 21 12/28/2017   ALT 7 12/28/2017   PROT 7.1 12/28/2017   ALBUMIN 3.9 12/28/2017   CALCIUM 9.7 12/28/2017   ANIONGAP 10 10/13/2015   GFR 85.09 12/28/2017   Lab Results  Component Value Date   CHOL 212 (H) 01/08/2020   Lab Results  Component Value Date   HDL 68.20 01/08/2020   Lab Results  Component Value Date   LDLCALC 120 (H) 01/08/2020   Lab Results  Component Value Date   TRIG 120.0 01/08/2020   Lab Results  Component Value Date   CHOLHDL 3 01/08/2020   Lab Results  Component Value Date   HGBA1C 5.9 12/28/2017      Assessment & Plan:   Problem List Items Addressed This Visit    Pure hypercholesterolemia, on Pravastatin   Relevant Orders   EKG 12-Lead (Completed)   Lipid panel    Other Visit Diagnoses    Screen for colon cancer    -  Primary   Relevant Orders   Comprehensive metabolic panel   Ambulatory referral to Gastroenterology   Essential hypertension       Relevant Orders   EKG 12-Lead (Completed)   Encounter for general adult medical examination with abnormal findings        Relevant Orders   EKG 12-Lead (Completed)   CBC with Differential   TSH   Screening mammogram for breast cancer       Relevant Orders   MM Digital Screening   Mass of left foot       Relevant Orders   Ambulatory referral to Podiatry     Anticipatory guidance appropriate for age completed today.  Patient will follow-up with routine health maintenance.  Encouraged her to have a Pap smear although she declines.  Encouraged healthy diet, exercise, monthly self breast exams.  We will follow-up with referrals as scheduled and see her back here in 6 months and sooner as needed.  Follow-up: No follow-ups on file.    Kennyth Arnold, FNP

## 2021-03-31 LAB — CBC WITH DIFFERENTIAL/PLATELET
Basophils Absolute: 0 10*3/uL (ref 0.0–0.1)
Basophils Relative: 0.8 % (ref 0.0–3.0)
Eosinophils Absolute: 0.3 10*3/uL (ref 0.0–0.7)
Eosinophils Relative: 5.3 % — ABNORMAL HIGH (ref 0.0–5.0)
HCT: 36.7 % (ref 36.0–46.0)
Hemoglobin: 12.4 g/dL (ref 12.0–15.0)
Lymphocytes Relative: 46 % (ref 12.0–46.0)
Lymphs Abs: 2.7 10*3/uL (ref 0.7–4.0)
MCHC: 33.8 g/dL (ref 30.0–36.0)
MCV: 94.8 fl (ref 78.0–100.0)
Monocytes Absolute: 0.3 10*3/uL (ref 0.1–1.0)
Monocytes Relative: 5.3 % (ref 3.0–12.0)
Neutro Abs: 2.5 10*3/uL (ref 1.4–7.7)
Neutrophils Relative %: 42.6 % — ABNORMAL LOW (ref 43.0–77.0)
Platelets: 209 10*3/uL (ref 150.0–400.0)
RBC: 3.87 Mil/uL (ref 3.87–5.11)
RDW: 13.1 % (ref 11.5–15.5)
WBC: 5.9 10*3/uL (ref 4.0–10.5)

## 2021-03-31 LAB — LIPID PANEL
Cholesterol: 185 mg/dL (ref 0–200)
HDL: 61.1 mg/dL (ref >39.00)
LDL Cholesterol: 106 mg/dL — ABNORMAL HIGH (ref 0–99)
NonHDL: 123.69
Total CHOL/HDL Ratio: 3
Triglycerides: 86 mg/dL (ref 0.0–149.0)
VLDL: 17.2 mg/dL (ref 0.0–40.0)

## 2021-03-31 LAB — COMPREHENSIVE METABOLIC PANEL
ALT: 7 U/L (ref 0–35)
AST: 18 U/L (ref 0–37)
Albumin: 4.1 g/dL (ref 3.5–5.2)
Alkaline Phosphatase: 71 U/L (ref 39–117)
BUN: 12 mg/dL (ref 6–23)
CO2: 30 mEq/L (ref 19–32)
Calcium: 9.9 mg/dL (ref 8.4–10.5)
Chloride: 102 mEq/L (ref 96–112)
Creatinine, Ser: 0.79 mg/dL (ref 0.40–1.20)
GFR: 79.07 mL/min (ref >60.00)
Glucose, Bld: 100 mg/dL — ABNORMAL HIGH (ref 70–99)
Potassium: 3.6 mEq/L (ref 3.5–5.1)
Sodium: 139 mEq/L (ref 135–145)
Total Bilirubin: 0.5 mg/dL (ref 0.2–1.2)
Total Protein: 7.7 g/dL (ref 6.0–8.3)

## 2021-03-31 LAB — TSH: TSH: 2.69 u[IU]/mL (ref 0.35–4.50)

## 2021-04-03 ENCOUNTER — Other Ambulatory Visit: Payer: Self-pay | Admitting: Physician Assistant

## 2021-04-03 DIAGNOSIS — I10 Essential (primary) hypertension: Secondary | ICD-10-CM

## 2021-04-08 ENCOUNTER — Ambulatory Visit: Payer: BC Managed Care – PPO | Admitting: Podiatry

## 2021-04-19 ENCOUNTER — Other Ambulatory Visit: Payer: Self-pay | Admitting: Family Medicine

## 2021-04-19 DIAGNOSIS — I10 Essential (primary) hypertension: Secondary | ICD-10-CM

## 2021-05-14 ENCOUNTER — Other Ambulatory Visit: Payer: Self-pay | Admitting: Physician Assistant

## 2021-05-14 ENCOUNTER — Ambulatory Visit (INDEPENDENT_AMBULATORY_CARE_PROVIDER_SITE_OTHER): Payer: BC Managed Care – PPO | Admitting: Physician Assistant

## 2021-05-14 ENCOUNTER — Encounter: Payer: Self-pay | Admitting: Physician Assistant

## 2021-05-14 ENCOUNTER — Other Ambulatory Visit: Payer: Self-pay

## 2021-05-14 VITALS — BP 134/76 | HR 60 | Temp 98.2°F | Ht 67.0 in | Wt 279.2 lb

## 2021-05-14 DIAGNOSIS — R8290 Unspecified abnormal findings in urine: Secondary | ICD-10-CM | POA: Diagnosis not present

## 2021-05-14 DIAGNOSIS — N390 Urinary tract infection, site not specified: Secondary | ICD-10-CM | POA: Diagnosis not present

## 2021-05-14 DIAGNOSIS — M545 Low back pain, unspecified: Secondary | ICD-10-CM | POA: Diagnosis not present

## 2021-05-14 LAB — POCT URINALYSIS DIPSTICK
Bilirubin, UA: NEGATIVE
Glucose, UA: NEGATIVE
Ketones, UA: NEGATIVE
Protein, UA: POSITIVE — AB
Spec Grav, UA: >=1.03 — AB (ref 1.010–1.025)
Urobilinogen, UA: 1 E.U./dL
pH, UA: 5.5 (ref 5.0–8.0)

## 2021-05-14 MED ORDER — METHYLPREDNISOLONE 4 MG PO TBPK: ORAL_TABLET | ORAL | 0 refills | Status: DC

## 2021-05-14 MED ORDER — METHOCARBAMOL 500 MG PO TABS: 500.0000 mg | ORAL_TABLET | Freq: Four times a day (QID) | ORAL | 0 refills | Status: AC

## 2021-05-14 MED ORDER — CEPHALEXIN 500 MG PO CAPS: 500.0000 mg | ORAL_CAPSULE | Freq: Three times a day (TID) | ORAL | 0 refills | Status: AC

## 2021-05-14 NOTE — Progress Notes (Signed)
Acute Office Visit  Subjective:    Patient ID: Lisa Mcconnell, female    DOB: 1956-02-20, 65 y.o.   MRN: [id number]  Chief Complaint  Patient presents with   Back Pain    Back Pain This is a recurrent problem. The current episode started more than 1 year ago. The problem occurs intermittently. The problem has been waxing and waning since onset. The pain is present in the lumbar spine. Quality: intermittent sharpness. The pain does not radiate. Exacerbated by: walking triggers sharp pain sometimes. She has tried analgesics (new mattress) for the symptoms. The treatment provided no relief.   Yesterday she had an episode of acute sharpness in her right lower back that lasted around 2 hours. No N/V. No CP, SOB, or dizziness. No blood in urine. No dysuria.  No known history of kidney stones. Right lower back still feels sore today, but not to the extent of the sharpness yesterday.  No hx of cancer. Pain does not radiate.   Past Medical History:  Diagnosis Date   ACE-inhibitor cough    Anemia    Arthritis    Chronic constipation    Hemorrhoids    Hypertension    Obesity     Past Surgical History:  Procedure Laterality Date   CESAREAN SECTION     x 3    COLONOSCOPY     left knee replacement  03/2008   TOTAL KNEE ARTHROPLASTY Right 10/12/2015   TOTAL KNEE ARTHROPLASTY Right 10/12/2015   Procedure: TOTAL KNEE ARTHROPLASTY;  Surgeon: Frederik Pear, MD;  Location: [facility];  Service: Orthopedics;  Laterality: Right;    Family History  Problem Relation Age of Onset   Hypertension Mother    Stroke Mother    Kidney failure Mother    Aneurysm Mother    Alcohol abuse Father    Hypertension Father    Heart attack Father    Heart disease Maternal Grandmother    Heart disease Maternal Grandfather    Heart disease Paternal Grandmother    Heart disease Paternal Grandfather     Social History   Socioeconomic History   Marital status: Married    Spouse name: Not on file   Number of  children: Not on file   Years of education: Not on file   Highest education level: Not on file  Occupational History   Not on file  Tobacco Use   Smoking status: Never   Smokeless tobacco: Never  Substance and Sexual Activity   Alcohol use: No   Drug use: No   Sexual activity: Not on file  Other Topics Concern   Not on file  Social History Narrative   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     amLODipine (NORVASC) 5 MG tablet TAKE 1 TABLET (5 MG TOTAL) BY MOUTH DAILY. 90 tablet 1   aspirin EC 81 MG tablet Take 1 tablet (81 mg total) by  mouth daily. 90 tablet 1   ibuprofen (ADVIL) 600 MG tablet Take 1 tablet (600 mg total) by mouth every 6 (six) hours as needed for mild pain or moderate pain. 90 tablet 1   losartan-hydrochlorothiazide (HYZAAR) 100-25 MG tablet TAKE 1 TABLET BY MOUTH EVERY DAY 90 tablet 1   meloxicam (MOBIC) 15 MG tablet Take 1 tablet (15 mg total) by mouth daily. 30 tablet 0   pravastatin (PRAVACHOL) 20 MG tablet Take 1 tablet (20 mg total) by mouth daily. 90 tablet 3   No facility-administered medications prior to visit.    Allergies  Allergen Reactions   Benazepril Hcl Cough    Review of Systems  Musculoskeletal:  Positive for back pain.  All other systems reviewed and are negative.     Objective:    Physical Exam Vitals and nursing note reviewed.  Constitutional:      Appearance: Normal appearance. She is obese. She is not toxic-appearing.   HENT:     Head: Normocephalic and atraumatic.     Right Ear: External ear normal.     Left Ear: External ear normal.     Nose: Nose normal.     Mouth/Throat:     Mouth: Mucous membranes are moist.  Eyes:     Extraocular Movements: Extraocular movements intact.     Conjunctiva/sclera: Conjunctivae normal.     Pupils: Pupils are equal, round, and reactive to light.  Cardiovascular:     Rate and Rhythm: Normal rate and regular rhythm.     Pulses: Normal pulses.     Heart sounds: Normal heart sounds.  Pulmonary:     Effort: Pulmonary effort is normal.     Breath sounds: Normal breath sounds.  Abdominal:     General: Abdomen is flat. Bowel sounds are normal.     Palpations: Abdomen is soft.     Tenderness: There is no right CVA tenderness or left CVA tenderness.  Musculoskeletal:        General: Normal range of motion.     Cervical back: Normal range of motion and neck supple.     Lumbar back: No bony tenderness. Negative right straight leg raise test and negative left straight leg raise test.       Back:  Skin:    General: Skin is warm and dry.  Neurological:     General: No focal deficit present.     Mental Status: She is alert and oriented to person, place, and time.  Psychiatric:        Mood and Affect: Mood normal.        Behavior: Behavior normal.        Thought Content: Thought content normal.        Judgment: Judgment normal.    BP 134/76   Pulse 60   Temp 98.2 F (36.8 C)   Ht 5\' 7"  (1.702 m)   Wt 279 lb 4 oz (126.7 kg)   SpO2 100%   BMI 43.74 kg/m  Wt Readings from Last 3 Encounters:  05/14/21 279 lb 4 oz (126.7 kg)  03/30/21 278 lb 9.6 oz (126.4 kg)  01/08/20 276 lb 8 oz (125.4 kg)    Health Maintenance Due  Topic Date Due   COVID-19 Vaccine (1) Never done   Zoster Vaccines- Shingrix (1 of 2) Never done   MAMMOGRAM  08/24/2017   TETANUS/TDAP  02/24/2018   COLONOSCOPY (Pts 45-50yrs Insurance coverage will need to be confirmed)  09/19/2018   PAP  SMEAR-Modifier  01/23/2021    There are no preventive care reminders to display for this patient.   Lab Results  Component Value Date   TSH 2.69 03/30/2021   Lab Results  Component Value Date   WBC 5.9 03/30/2021   HGB 12.4 03/30/2021   HCT 36.7 03/30/2021   MCV 94.8 03/30/2021   PLT 209.0 03/30/2021   Lab Results  Component Value Date   NA 139 03/30/2021   K 3.6 03/30/2021   CO2 30 03/30/2021   GLUCOSE 100 (H) 03/30/2021   BUN 12 03/30/2021   CREATININE 0.79 03/30/2021   BILITOT 0.5 03/30/2021   ALKPHOS 71 03/30/2021   AST 18 03/30/2021   ALT 7 03/30/2021   PROT 7.7 03/30/2021   ALBUMIN 4.1 03/30/2021   CALCIUM 9.9 03/30/2021   ANIONGAP 10 10/13/2015   GFR 79.07 03/30/2021   Lab Results  Component Value Date   CHOL 185 03/30/2021   Lab Results  Component Value Date   HDL 61.10 03/30/2021   Lab Results  Component Value Date   LDLCALC 106 (H) 03/30/2021   Lab Results  Component Value Date   TRIG 86.0 03/30/2021   Lab Results  Component Value Date   CHOLHDL 3 03/30/2021   Lab Results  Component Value Date   HGBA1C 5.9 12/28/2017       Assessment & Plan:   Problem List Items Addressed This Visit   None Visit Diagnoses     Acute right-sided low back pain without sciatica    -  Primary   Relevant Medications   methylPREDNISolone (MEDROL DOSEPAK) 4 MG TBPK tablet   methocarbamol (ROBAXIN) 500 MG tablet   Other Relevant Orders   POCT urinalysis dipstick (Completed)   Abnormal urinalysis       Relevant Orders   Urine Culture        Meds ordered this encounter  Medications   methylPREDNISolone (MEDROL DOSEPAK) 4 MG TBPK tablet    Sig: Please take per packaging instructions.    Dispense:  21 tablet    Refill:  0   methocarbamol (ROBAXIN) 500 MG tablet    Sig: Take 1 tablet (500 mg total) by mouth 4 (four) times daily for 5 days.    Dispense:  20 tablet    Refill:  0   1. Acute right-sided low back pain without sciatica Acute on  chronic LBP vs possible renal stone? I personally reviewed her XR report from 01-08-20 and it did show signs of arthritis. No red flags today. Pain is improved, but still dull and present. She has done well with medrol dosepak in the past. I will also Rx Robaxin as a muscle relaxant. Discussed she would benefit from weight loss and may want to consider PT.  2. Abnormal urinalysis Surprisingly looks abnormal showing cystitis signs. Started her on cephalexin and will culture urine. Informed her to push fluids this weekend.   She knows to go to the ED if acutely worse or any changes.    Vern Prestia M Shakedra Beam, PA-C

## 2021-05-14 NOTE — Patient Instructions (Signed)
Please check a urine in office today to rule out microscopic blood. If this is positive, we will need to check for a kidney stone. Most likely this is a muscle spasm / flare of your arthritis. Take the steroid pak as directed per package. May use muscle relaxant up to 4 times daily.  Caution with driving while taking the muscle relaxer! Continue your Tylenol. Consider PT for this back pain.

## 2021-05-16 LAB — URINE CULTURE
MICRO NUMBER:: 12097234
SPECIMEN QUALITY:: ADEQUATE

## 2021-06-18 DIAGNOSIS — M545 Low back pain, unspecified: Secondary | ICD-10-CM | POA: Diagnosis not present

## 2021-08-20 ENCOUNTER — Telehealth (INDEPENDENT_AMBULATORY_CARE_PROVIDER_SITE_OTHER): Payer: BC Managed Care – PPO | Admitting: Physician Assistant

## 2021-08-20 ENCOUNTER — Other Ambulatory Visit: Payer: Self-pay

## 2021-08-20 ENCOUNTER — Encounter: Payer: Self-pay | Admitting: Physician Assistant

## 2021-08-20 VITALS — Ht 67.0 in

## 2021-08-20 DIAGNOSIS — R052 Subacute cough: Secondary | ICD-10-CM | POA: Diagnosis not present

## 2021-08-20 MED ORDER — BENZONATATE 100 MG PO CAPS: 100.0000 mg | ORAL_CAPSULE | Freq: Two times a day (BID) | ORAL | 0 refills | Status: DC | PRN

## 2021-08-20 MED ORDER — AZITHROMYCIN 250 MG PO TABS: ORAL_TABLET | ORAL | 0 refills | Status: AC

## 2021-08-20 NOTE — Progress Notes (Signed)
TELEPHONE ENCOUNTER   Patient verbally agreed to telephone visit and is aware that copayment and coinsurance may apply. Patient was treated using telemedicine according to accepted telemedicine protocols.  Location of the patient: home Location of provider: [place] Names of all persons participating in the telemedicine service and role in the encounter: Inda Coke, [place] , Irving Burton  Subjective:   Chief Complaint  Patient presents with   Cough     HPI   URI Has had a cough x 3 weeks. She got this originally from her granddaughter. Has been using mucinex. Took 10 tablets of amoxicillin-clavulanic acid that her daughter had leftover. Has not been drinking enough water, drinks mostly Pepsi and Coke.  Denies: fever, chills, sore throat, myalgias   Patient Active Problem List   Diagnosis Date Noted   Insulin resistance 10/30/2018   Pure hypercholesterolemia, on Pravastatin 12/29/2017   ASCVD (arteriosclerotic cardiovascular disease) risk 9.5% 12/29/2017   Morbid obesity ([place]) 12/29/2017   Primary osteoarthritis of right knee 10/11/2015   Hypertension, on Hyzaar and Norvasc 10/06/2008   Social History   Tobacco Use   Smoking status: Never   Smokeless tobacco: Never  Substance Use Topics   Alcohol use: No    Current Outpatient Medications:    acetaminophen (TYLENOL) 500 MG tablet, Take 1,000 mg by mouth every 6 (six) hours as needed for moderate pain., Disp: , Rfl:    amLODipine (NORVASC) 5 MG tablet, TAKE 1 TABLET (5 MG TOTAL) BY MOUTH DAILY., Disp: 90 tablet, Rfl: 1   aspirin EC 81 MG tablet, Take 1 tablet (81 mg total) by mouth daily., Disp: 90 tablet, Rfl: 1   azithromycin (ZITHROMAX) 250 MG tablet, Take 2 tablets on day 1, then 1 tablet daily on days 2 through 5, Disp: 6 tablet, Rfl: 0   benzonatate (TESSALON) 100 MG capsule, Take 1 capsule (100 mg total) by mouth 2 (two) times daily as needed for cough., Disp: 30 capsule, Rfl: 0   ibuprofen  (ADVIL) 600 MG tablet, Take 1 tablet (600 mg total) by mouth every 6 (six) hours as needed for mild pain or moderate pain., Disp: 90 tablet, Rfl: 1   losartan-hydrochlorothiazide (HYZAAR) 100-25 MG tablet, TAKE 1 TABLET BY MOUTH EVERY DAY, Disp: 90 tablet, Rfl: 1 Allergies  Allergen Reactions   Benazepril Hcl Cough    Assessment & Plan:   1. Subacute cough   No red flags on discussion. Concern for partially treated URI, recommend azithromycin and tessalon perles. Encouraged drinking plenty of water.   Discussed taking medications as prescribed. Reviewed return precautions including worsening fever, SOB, worsening cough or other concerns. Push fluids and rest. I recommend that patient follow-up if symptoms worsen or persist despite treatment x 7-10 days, sooner if needed.  No orders of the defined types were placed in this encounter.  Meds ordered this encounter  Medications   azithromycin (ZITHROMAX) 250 MG tablet    Sig: Take 2 tablets on day 1, then 1 tablet daily on days 2 through 5    Dispense:  6 tablet    Refill:  0    Order Specific Question:   Supervising Provider    Answer:   Vivi Barrack [[id number]]   benzonatate (TESSALON) 100 MG capsule    Sig: Take 1 capsule (100 mg total) by mouth 2 (two) times daily as needed for cough.    Dispense:  30 capsule    Refill:  0    Order Specific Question:  Supervising Provider    Answer:   Vivi Barrack [place], PA 08/20/2021  Time spent with the patient: 9 minutes, spent in obtaining information about her symptoms, reviewing her previous labs, evaluations, and treatments, counseling her about her condition (please see the discussed topics above), and developing a plan to further investigate it; she had a number of questions which I addressed.

## 2021-11-06 ENCOUNTER — Other Ambulatory Visit: Payer: Self-pay | Admitting: Family Medicine

## 2021-11-06 DIAGNOSIS — I10 Essential (primary) hypertension: Secondary | ICD-10-CM

## 2021-12-15 ENCOUNTER — Telehealth: Payer: Self-pay | Admitting: Physician Assistant

## 2021-12-15 DIAGNOSIS — I10 Essential (primary) hypertension: Secondary | ICD-10-CM

## 2021-12-15 MED ORDER — LOSARTAN POTASSIUM-HCTZ 100-25 MG PO TABS: 1.0000 | ORAL_TABLET | Freq: Every day | ORAL | 1 refills | Status: DC

## 2021-12-15 NOTE — Telephone Encounter (Signed)
°  Patient Name: Lisa Mcconnell Gender: Female DOB: Jun 28, 1956 Age: 66 Y 65 M 5 D Return Phone Number: [phone] (Primary) Address: City/ State/ Zip: [place]   [address] Client [facility] Client Site [facility] Night Provider Dimas Chyle- MD Contact Type Call Who Is Calling Patient / Member / Family / Caregiver Call Type Triage / Clinical Relationship To Patient Self Return Phone Number [phone] (Primary) Chief Complaint Prescription Refill or Medication Request (non symptomatic) Reason for Call Medication Question / Request Initial Comment Caller is still waiting for her doctor to refill her blood pressure medication, caller states that her pharmacy has contacted the doctor's office twice but they haven't gotten any response yet. Translation No Nurse Assessment Nurse: Nehemiah Massed, RN, Tammy Date/Time (Eastern Time): 12/14/2021 7:23:59 PM Confirm and document reason for call. If symptomatic, describe symptoms. ---Caller states she is out of blood pressure meds, but pharmacy has contacted the office but not heard back from them. She just left the pharmacy and they sent to the office but they cant get her medications. Caller denies any new or worsening symptoms at this time, she reports she did take her medications tonight. She declined to be triaged at this time. Does the patient have any new or worsening symptoms? ---No Disp. Time Eilene Ghazi Time) Disposition Final User 12/14/2021 7:31:06 PM Clinical Call Yes Planck, RN, Tammy Caller Disagree/Comply Comply Caller Understands Yes PreDisposition Go to ED Comments User: Liana Gerold, RN Date/Time Eilene Ghazi Time): 12/14/2021 7:30:57 PM Comments Pt called tonight due to when she picked up her BP meds, they did not refill her losartan. Pharmacy has contacted office but Pt wants office to be aware she needs her losartan refilled.

## 2021-12-15 NOTE — Telephone Encounter (Signed)
Pt called back told her I need to know where to send Rx they did not receive your Losartan/HCTZ and they said you transferred your Rx's out. Pt said no she still uses [facility] but will start using [facility] in March. Told her okay will [facility] on [address]. Pt verbalized understanding. Rx sent.

## 2021-12-15 NOTE — Telephone Encounter (Signed)
Spoke to pt told her both of her blood pressure medications were sent to the pharmacy on 11/09/2021. Pt said pharmacy does not have the Losartan only had the one. Told her I will call the pharmacy and call you back. Pt verbalized understanding.   Called pharmacy and spoke to [place], she said pt picked up Amlodipine yesterday but that is all the have and she transferred her Rx's out but does not know where. They did not receive Losartan Rx. Told her okay I will contact pt to see where to send Rx.

## 2021-12-15 NOTE — Telephone Encounter (Signed)
Tried to contact pt again voicemail box is full on mobile # unable to leave message. Home # just rings, no answer.

## 2021-12-15 NOTE — Telephone Encounter (Signed)
Left message on voicemail to call office. Called pt back no answer and voicemail box is full.

## 2021-12-23 ENCOUNTER — Ambulatory Visit (INDEPENDENT_AMBULATORY_CARE_PROVIDER_SITE_OTHER): Payer: Medicare Other

## 2021-12-23 ENCOUNTER — Ambulatory Visit: Payer: Medicare Other | Admitting: Podiatry

## 2021-12-23 ENCOUNTER — Other Ambulatory Visit: Payer: Self-pay

## 2021-12-23 DIAGNOSIS — R2242 Localized swelling, mass and lump, left lower limb: Secondary | ICD-10-CM | POA: Diagnosis not present

## 2021-12-26 NOTE — Progress Notes (Signed)
Subjective:   Patient ID: Lisa Mcconnell, female   DOB: 65 y.o.   MRN: [id number]   HPI 66 year old female presents the office today for concerns of a soft tissue mass on the lateral aspect the left ankle which started in 2009.  She states this started after she had knee surgery.  Last year she did follow-up with Dr. Gershon Mussel, the podiatrist she was referred to [place] for aspiration.  She presents today as it has been somewhat larger and has been starting to cause discomfort at times.  She said no recent treatment otherwise.  She has no other concerns today.   Review of Systems  All other systems reviewed and are negative.  Past Medical History:  Diagnosis Date   ACE-inhibitor cough    Anemia    Arthritis    Chronic constipation    Hemorrhoids    Hypertension    Obesity     Past Surgical History:  Procedure Laterality Date   CESAREAN SECTION     x 3    COLONOSCOPY     left knee replacement  03/2008   TOTAL KNEE ARTHROPLASTY Right 10/12/2015   TOTAL KNEE ARTHROPLASTY Right 10/12/2015   Procedure: TOTAL KNEE ARTHROPLASTY;  Surgeon: Frederik Pear, MD;  Location: [facility];  Service: Orthopedics;  Laterality: Right;     Current Outpatient Medications:    acetaminophen (TYLENOL) 500 MG tablet, Take 1,000 mg by mouth every 6 (six) hours as needed for moderate pain., Disp: , Rfl:    amLODipine (NORVASC) 5 MG tablet, TAKE 1 TABLET (5 MG TOTAL) BY MOUTH DAILY., Disp: 90 tablet, Rfl: 1   aspirin EC 81 MG tablet, Take 1 tablet (81 mg total) by mouth daily., Disp: 90 tablet, Rfl: 1   benzonatate (TESSALON) 100 MG capsule, Take 1 capsule (100 mg total) by mouth 2 (two) times daily as needed for cough., Disp: 30 capsule, Rfl: 0   ibuprofen (ADVIL) 600 MG tablet, Take 1 tablet (600 mg total) by mouth every 6 (six) hours as needed for mild pain or moderate pain., Disp: 90 tablet, Rfl: 1   losartan-hydrochlorothiazide (HYZAAR) 100-25 MG tablet, Take 1 tablet by mouth daily., Disp: 90 tablet, Rfl:  1  Allergies  Allergen Reactions   Benazepril Hcl Cough          Objective:  Physical Exam  General: AAO x3, NAD  Dermatological: Skin is warm, dry and supple bilateral.  There are no open sores, no preulcerative lesions, no rash or signs of infection present.  Vascular: Dorsalis Pedis artery and Posterior Tibial artery pedal pulses are 2/4 bilateral with immedate capillary fill time.  There is no pain with calf compression, swelling, warmth, erythema.   Neruologic: Grossly intact via light touch bilateral.   Musculoskeletal: Large fluid-filled mobile soft tissue mass is noted on the lateral aspect of the ankle seems to be more along the sinus tarsi.  Measure about 4 x 3.5 cm.  There is no erythema or warmth.  It is soft in nature.  Mild tenderness palpation to the lesion.  Muscular strength 5/5 in all groups tested bilateral.  Gait: Unassisted, Nonantalgic.       Assessment:   Soft tissue mass left ankle/lateral foot     Plan:  -Treatment options discussed including all alternatives, risks, and complications -Etiology of symptoms were discussed -X-rays were obtained and reviewed with the patient.  Notes of acute fracture calcifications.  Soft tissue swelling noted. -I discussed with her trying to aspirate this suspected discussed  with her prior to doing this that since has been ongoing for quite some time may not be able to pull any fluid off of this.  She still wants to attempt this.  Discussed risks of aspiration and verbal consent obtained.  Skin was prepped with alcohol and 3 cc lidocaine or Marcaine plain was infiltrated in a regional block fashion.  Once anesthetized I prepped the skin with Betadine.  I utilized an 18-gauge needle and but I was not able to aspirate any fluid.  After 2 attempts I abandoned the procedure.  I cleaned the area and Band-Aid and compression bandage applied. -I have ordered an ultrasound to further evaluate the area.  Discussed with her possible  surgical excision.  Trula Slade DPM

## 2022-01-05 ENCOUNTER — Ambulatory Visit
Admission: RE | Admit: 2022-01-05 | Discharge: 2022-01-05 | Disposition: A | Discharge status: Home / Self Care | Payer: Medicare Other | Source: Ambulatory Visit | Attending: Podiatry | Admitting: Podiatry

## 2022-01-05 DIAGNOSIS — R2242 Localized swelling, mass and lump, left lower limb: Secondary | ICD-10-CM

## 2022-01-21 ENCOUNTER — Ambulatory Visit: Payer: Medicare Other | Admitting: Podiatry

## 2022-01-21 ENCOUNTER — Telehealth: Payer: Self-pay | Admitting: Urology

## 2022-01-21 ENCOUNTER — Other Ambulatory Visit: Payer: Self-pay

## 2022-01-21 ENCOUNTER — Encounter: Payer: Self-pay | Admitting: Podiatry

## 2022-01-21 DIAGNOSIS — R2242 Localized swelling, mass and lump, left lower limb: Secondary | ICD-10-CM | POA: Diagnosis not present

## 2022-01-21 NOTE — Patient Instructions (Signed)

## 2022-01-21 NOTE — Telephone Encounter (Signed)
DOS - 01/26/22 ? ?EXC. BENIGN LESION LEFT --- 56979 ? ?[employer] EFFECTIVE DATE - 11/07/21 ? ?PLAN DEDUCTIBLE - $0.00 ?OUT OF POCKET - $3,600.00 W/ $3,524.03 REMAINING ?COINSURANCE - 0% ?COPAY - $295.00 ? ? ?PER [employer] WEBSITE FOR CPT CODE 48016 Notification or Prior Authorization is not required for the requested services ? ?Decision ID #:[id number] ? ?

## 2022-01-25 DIAGNOSIS — R2242 Localized swelling, mass and lump, left lower limb: Secondary | ICD-10-CM | POA: Insufficient documentation

## 2022-01-25 NOTE — Progress Notes (Signed)
Subjective: ?66 year old female presents the office today for surgical consultation given ongoing soft tissue mass lateral aspect of the left ankle which has been ongoing since 2009.  She had an ultrasound performed.  She presents today to further discuss this.  I will contact her with results over the phone we discussed surgical intervention and she was to proceed with this. ? ?Objective: ?AAO x3, NAD ?DP/PT pulses palpable bilaterally, CRT less than 3 seconds ?Soft tissue mass on the anterior lateral aspect of the ankle measuring approximately 4 x 3 cm which appears to be soft is present.  She does get occasional discomfort in the area.  There is no other areas of tenderness.  No pain with calf compression, swelling, warmth, erythema ? ?Assessment: ?Soft tissue mass left ankle ? ?Plan: ?-All treatment options discussed with the patient including all alternatives, risks, complications.  ?-Again reviewed the ultrasounds with her.  I discussed both conservative as well as surgical treatment options.  After discussion she was proceed with surgical intervention.  Discussed with her excision soft tissue mass left ankle.  We will plan on doing this likely next week. ?-The incision placement as well as the postoperative course was discussed with the patient. I discussed risks of the surgery which include, but not limited to, infection, bleeding, pain, swelling, need for further surgery, delayed or nonhealing, painful or ugly scar, numbness or sensation changes, over/under correction, recurrence, transfer lesions, further deformity, hardware failure, DVT/PE, loss of toe/foot. Patient understands these risks and wishes to proceed with surgery. The surgical consent was reviewed with the patient all 3 pages were signed. No promises or guarantees were given to the outcome of the procedure. All questions were answered to the best of my ability. Before the surgery the patient was encouraged to call the office if there is any  further questions. The surgery will be performed at the [facility] on an outpatient basis. ?-Patient encouraged to call the office with any questions, concerns, change in symptoms.  ? ?Trula Slade DPM ? ?

## 2022-01-26 ENCOUNTER — Encounter: Payer: Self-pay | Admitting: Podiatry

## 2022-01-26 ENCOUNTER — Other Ambulatory Visit: Payer: Self-pay | Admitting: Podiatry

## 2022-01-26 DIAGNOSIS — D1724 Benign lipomatous neoplasm of skin and subcutaneous tissue of left leg: Secondary | ICD-10-CM | POA: Diagnosis not present

## 2022-01-26 DIAGNOSIS — D2372 Other benign neoplasm of skin of left lower limb, including hip: Secondary | ICD-10-CM | POA: Diagnosis not present

## 2022-01-26 DIAGNOSIS — R2242 Localized swelling, mass and lump, left lower limb: Secondary | ICD-10-CM | POA: Diagnosis not present

## 2022-01-26 DIAGNOSIS — D1779 Benign lipomatous neoplasm of other sites: Secondary | ICD-10-CM | POA: Diagnosis not present

## 2022-01-26 MED ORDER — OXYCODONE-ACETAMINOPHEN 5-325 MG PO TABS
1.0000 | ORAL_TABLET | Freq: Four times a day (QID) | ORAL | 0 refills | Status: DC | PRN
Start: 2022-01-26 — End: 2022-06-03

## 2022-01-26 MED ORDER — CEPHALEXIN 500 MG PO CAPS: 500.0000 mg | ORAL_CAPSULE | Freq: Three times a day (TID) | ORAL | 0 refills | Status: DC

## 2022-01-26 MED ORDER — ONDANSETRON HCL 4 MG PO TABS: 4.0000 mg | ORAL_TABLET | Freq: Three times a day (TID) | ORAL | 0 refills | Status: DC | PRN

## 2022-01-26 NOTE — Progress Notes (Signed)
Postop medications sent 

## 2022-01-31 ENCOUNTER — Other Ambulatory Visit: Payer: Self-pay

## 2022-01-31 ENCOUNTER — Ambulatory Visit (INDEPENDENT_AMBULATORY_CARE_PROVIDER_SITE_OTHER): Payer: Medicare Other

## 2022-01-31 ENCOUNTER — Ambulatory Visit (INDEPENDENT_AMBULATORY_CARE_PROVIDER_SITE_OTHER): Payer: Medicare Other | Admitting: Podiatry

## 2022-01-31 VITALS — BP 163/77 | Temp 98.7°F

## 2022-01-31 DIAGNOSIS — R2242 Localized swelling, mass and lump, left lower limb: Secondary | ICD-10-CM

## 2022-01-31 DIAGNOSIS — Z9889 Other specified postprocedural states: Secondary | ICD-10-CM

## 2022-01-31 MED ORDER — GABAPENTIN 100 MG PO CAPS: 100.0000 mg | ORAL_CAPSULE | Freq: Three times a day (TID) | ORAL | 0 refills | Status: DC

## 2022-01-31 MED ORDER — TRAMADOL HCL 50 MG PO TABS: 50.0000 mg | ORAL_TABLET | Freq: Three times a day (TID) | ORAL | 0 refills | Status: AC | PRN

## 2022-02-01 NOTE — Progress Notes (Signed)
Subjective: ?Lisa Mcconnell is a 66 y.o. is seen today in office s/p left ankle soft tissue mass removal preformed on 01/26/2022.  For that she is doing well she is only taken Tylenol for pain.  She has been in a cam boot.  Denies any systemic complaints such as fevers, chills, nausea, vomiting. No calf pain, chest pain, shortness of breath.  ? ?Objective: ?General: No acute distress, AAOx3  ?DP/PT pulses palpable 2/4, CRT < 3 sec to all digits.  ?Protective sensation intact. Motor function intact.  ?Left foot: Incision is well coapted without any evidence of dehiscence with sutures intact. There is no surrounding erythema, ascending cellulitis, fluctuance, crepitus, malodor, drainage/purulence. There is minimal edema around the surgical site. There is no significant pain along the surgical site.  ?No other areas of tenderness to bilateral lower extremities.  ?No other open lesions or pre-ulcerative lesions.  ?No pain with calf compression, swelling, warmth, erythema.  ? ?Assessment and Plan:  ?Status post left soft tissue mass removal, doing well with no complications  ? ?-Treatment options discussed including all alternatives, risks, and complications ?-Incisions healing well.  Antibiotic ointment and a bandage applied.  If the dressing clean, dry, intact ?-Remain in cam boot for mobilization ?-Ice/elevation ?-Pain medication as needed. ?-Monitor for any clinical signs or symptoms of infection and DVT/PE and directed to call the office immediately should any occur or go to the ER. ?-Follow-up as scheduled for suture removal or sooner if any problems arise. In the meantime, encouraged to call the office with any questions, concerns, change in symptoms.  ? ?Celesta Gentile, DPM ? ?

## 2022-02-09 ENCOUNTER — Encounter: Payer: Self-pay | Admitting: Physician Assistant

## 2022-02-09 ENCOUNTER — Ambulatory Visit (INDEPENDENT_AMBULATORY_CARE_PROVIDER_SITE_OTHER): Payer: Medicare Other | Admitting: Physician Assistant

## 2022-02-09 VITALS — BP 136/79 | HR 60 | Temp 97.9°F | Ht 67.0 in | Wt 272.8 lb

## 2022-02-09 DIAGNOSIS — H1032 Unspecified acute conjunctivitis, left eye: Secondary | ICD-10-CM | POA: Diagnosis not present

## 2022-02-09 DIAGNOSIS — M1990 Unspecified osteoarthritis, unspecified site: Secondary | ICD-10-CM | POA: Diagnosis not present

## 2022-02-09 MED ORDER — MELOXICAM 15 MG PO TABS: 15.0000 mg | ORAL_TABLET | Freq: Every day | ORAL | 0 refills | Status: DC | PRN

## 2022-02-09 MED ORDER — POLYMYXIN B-TRIMETHOPRIM 10000-0.1 UNIT/ML-% OP SOLN: 1.0000 [drp] | OPHTHALMIC | 0 refills | Status: DC

## 2022-02-09 NOTE — Patient Instructions (Signed)
It was great to see you! ? ?Start eye drops for you eye ? ?Stop ibuprofen for pain -- try the mobic as needed ? ?Take care, ? ?Inda Coke PA-C  ?

## 2022-02-09 NOTE — Progress Notes (Signed)
Lisa Mcconnell is a 66 y.o. female here for a eye problem. ? ?History of Present Illness:  ? ?Chief Complaint  ?Patient presents with  ? Eye Problem  ?  Pt stated that she has been having crust in her eyes which was noticed this morning. Her grand daughter has pink eye and she just want to make sure she does not have it.  ? ?Possible Pink Eye ?Lisa Mcconnell presents with c/o crust around her left eye upon waking this morning. 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 At this time she is looking for relief and wanted to know if she could continue to work with this condition. Denies fever, chills, cough, or changes to vision.  ? ?Arthritis of Left Foot ?On 01/26/22, pt underwent left ankle soft tissue mass removal by Dr. Jacqualyn Posey, podiatry with no complications. Although she was prescribed oxycodone-acetaminophen 325 mg every 6 hours as needed for pain, she has not been able to tolerate this medication. Due to this she has tried taking ibuprofen instead but this hasn't provided much pain relief. At this time she is wondering if there is anything she could take that could provide her with relief of her arthritis.  ? ? ?Past Medical History:  ?Diagnosis Date  ? ACE-inhibitor cough   ? Anemia   ? Arthritis   ? Chronic constipation   ? Hemorrhoids   ? Hypertension   ? Obesity   ? ?  ?Social History  ? ?Tobacco Use  ? Smoking status: Never  ? Smokeless tobacco: Never  ?Substance Use Topics  ? Alcohol use: No  ? Drug use: No  ? ? ?Past Surgical History:  ?Procedure Laterality Date  ? CESAREAN SECTION    ? x 3   ? COLONOSCOPY    ? left knee replacement  03/2008  ? TOTAL KNEE ARTHROPLASTY Right 10/12/2015  ? TOTAL KNEE ARTHROPLASTY Right 10/12/2015  ? Procedure: TOTAL KNEE ARTHROPLASTY;  Surgeon: Frederik Pear, MD;  Location: [facility];  Service: Orthopedics;  Laterality: Right;  ? ? ?Family History  ?Problem Relation Age of Onset  ?  Hypertension Mother   ? Stroke Mother   ? Kidney failure Mother   ? Aneurysm Mother   ? Alcohol abuse Father   ? Hypertension Father   ? Heart attack Father   ? Heart disease Maternal Grandmother   ? Heart disease Maternal Grandfather   ? Heart disease Paternal Grandmother   ? Heart disease Paternal Grandfather   ? ? ?Allergies  ?Allergen Reactions  ? Benazepril Hcl Cough  ? ? ?Current Medications:  ? ?Current Outpatient Medications:  ?  acetaminophen (TYLENOL) 500 MG tablet, Take 1,000 mg by mouth every 6 (six) hours as needed for moderate pain., Disp: , Rfl:  ?  amLODipine (NORVASC) 5 MG tablet, TAKE 1 TABLET (5 MG TOTAL) BY MOUTH DAILY., Disp: 90 tablet, Rfl: 1 ?  aspirin EC 81 MG tablet, Take 1 tablet (81 mg total) by mouth daily., Disp: 90 tablet, Rfl: 1 ?  benzonatate (TESSALON) 100 MG capsule, Take 1 capsule (100 mg total) by mouth 2 (two) times daily as needed for cough., Disp: 30 capsule, Rfl: 0 ?  cephALEXin (KEFLEX) 500 MG capsule, Take 1 capsule (500 mg total) by mouth 3 (three) times daily., Disp: 9 capsule, Rfl: 0 ?  gabapentin (NEURONTIN) 100 MG capsule, Take 1 capsule (100 mg total) by  mouth 3 (three) times daily., Disp: 30 capsule, Rfl: 0 ?  ibuprofen (ADVIL) 600 MG tablet, Take 1 tablet (600 mg total) by mouth every 6 (six) hours as needed for mild pain or moderate pain., Disp: 90 tablet, Rfl: 1 ?  losartan-hydrochlorothiazide (HYZAAR) 100-25 MG tablet, Take 1 tablet by mouth daily., Disp: 90 tablet, Rfl: 1 ?  ondansetron (ZOFRAN) 4 MG tablet, Take 1 tablet (4 mg total) by mouth every 8 (eight) hours as needed for nausea or vomiting., Disp: 20 tablet, Rfl: 0 ?  oxyCODONE-acetaminophen (PERCOCET/ROXICET) 5-325 MG tablet, Take 1-2 tablets by mouth every 6 (six) hours as needed for severe pain., Disp: 20 tablet, Rfl: 0  ? ?Review of Systems:  ? ?ROS ?Negative unless otherwise specified per HPI. ?Vitals:  ? ?Vitals:  ? 02/09/22 0854  ?BP: 136/79  ?Pulse: 60  ?Temp: 97.9 ?F (36.6 ?C)  ?SpO2: 98%   ?Weight: 272 lb 12.8 oz (123.7 kg)  ?Height: '5\' 7"'$  (1.702 m)  ?   ?Body mass index is 42.73 kg/m?. ? ?Physical Exam:  ? ?Physical Exam ?Vitals and nursing note reviewed.  ?Constitutional:   ?   General: She is not in acute distress. ?   Appearance: She is well-developed. She is not ill-appearing or toxic-appearing.  ?Eyes:  ?   General: Lids are normal.  ?   Conjunctiva/sclera:  ?   Left eye: Left conjunctiva is injected.  ?Cardiovascular:  ?   Rate and Rhythm: Normal rate and regular rhythm.  ?   Pulses: Normal pulses.  ?   Heart sounds: Normal heart sounds, S1 normal and S2 normal.  ?Pulmonary:  ?   Effort: Pulmonary effort is normal.  ?   Breath sounds: Normal breath sounds.  ?Skin: ?   General: Skin is warm and dry.  ?Neurological:  ?   Mental Status: She is alert.  ?   GCS: GCS eye subscore is 4. GCS verbal subscore is 5. GCS motor subscore is 6.  ?Psychiatric:     ?   Speech: Speech normal.     ?   Behavior: Behavior normal. Behavior is cooperative.  ? ? ?Assessment and Plan:  ? ?Acute Conjunctivitis of Left Eye ?No red flags ?Start Polytrim ophthalmic solution, 1 drop in eye every 4 hours  ?Encouraged patient to practice good hand hygiene and to avoid touching their face  ?Follow up if new/worsening symptoms or concerns occur ? ?Arthritis ?No red flags ?Start meloxicam 15 mg daily as needed for pain ?Informed patient to not take ibuprofen with this medication ?Follow up as needed ? ? ?I,Havlyn C Ratchford,acting as a scribe for [employer], PA.,have documented all relevant documentation on the behalf of Inda Coke, PA,as directed by  Inda Coke, PA while in the presence of Inda Coke, [place]. ? ?IInda Coke, PA, have reviewed all documentation for this visit. The documentation on 02/09/22 for the exam, diagnosis, procedures, and orders are all accurate and complete. ? ? ?Inda Coke, PA-C ? ?

## 2022-02-10 ENCOUNTER — Ambulatory Visit (INDEPENDENT_AMBULATORY_CARE_PROVIDER_SITE_OTHER): Payer: Medicare Other | Admitting: Podiatry

## 2022-02-10 DIAGNOSIS — Z9889 Other specified postprocedural states: Secondary | ICD-10-CM

## 2022-02-10 DIAGNOSIS — D1724 Benign lipomatous neoplasm of skin and subcutaneous tissue of left leg: Secondary | ICD-10-CM

## 2022-02-12 NOTE — Progress Notes (Signed)
Subjective: ?Lisa Mcconnell is a 66 y.o. is seen today in office s/p left ankle soft tissue mass removal preformed on 01/26/2022.  She presents here for suture removal.  She said that she has been doing well not having any significant pain.  She did have to stop wearing the cam boot as it was causing other joints to hurt.  She is wearing a shoe that avoids pressure on the incision.  Denies any fevers or chills.  She has no other concerns.  ? ?Objective: ?General: No acute distress, AAOx3  ?DP/PT pulses palpable 2/4, CRT < 3 sec to all digits.  ?Protective sensation intact. Motor function intact.  ?Left foot: Incision is well coapted without any evidence of dehiscence with sutures intact.  There is mild edema but there is no erythema or warmth.  There is no evidence of dehiscence or any signs of infection.  No pain on exam.  ?No other open lesions or pre-ulcerative lesions.  ?No pain with calf compression, swelling, warmth, erythema.  ? ?Assessment and Plan:  ?Status post left soft tissue mass removal, doing well with no complications  ? ?-Treatment options discussed including all alternatives, risks, and complications ?-Sutures removed today without complications incisions well coapted.  Small amount of antibiotic ointment was applied followed by dressing.  Discussed that she can wash with soap and water and also keep a bandage on this as well as compression of any postoperative edema.  Continue to wear shoes that avoid pressure on the incision. ?-Reviewed pathology report which did show lipoma, benign ?-Monitor for any clinical signs or symptoms of infection and directed to call the office immediately should any occur or go to the ER. ? ?Return in about 4 weeks (around 03/10/2022). ? ?Trula Slade DPM ?

## 2022-02-24 ENCOUNTER — Encounter: Payer: Medicare Other | Admitting: Podiatry

## 2022-02-26 IMAGING — DX DG LUMBAR SPINE COMPLETE 4+V
5 series · 5 of 5 positions shown · non-contrast
Comparison: Plain films lumbar spine 04/06/2017.

CLINICAL DATA: Right side low back pain for 3 months. No known
injury.

EXAM:
LUMBAR SPINE - COMPLETE 4+ VIEW

[lumbar spine ap]
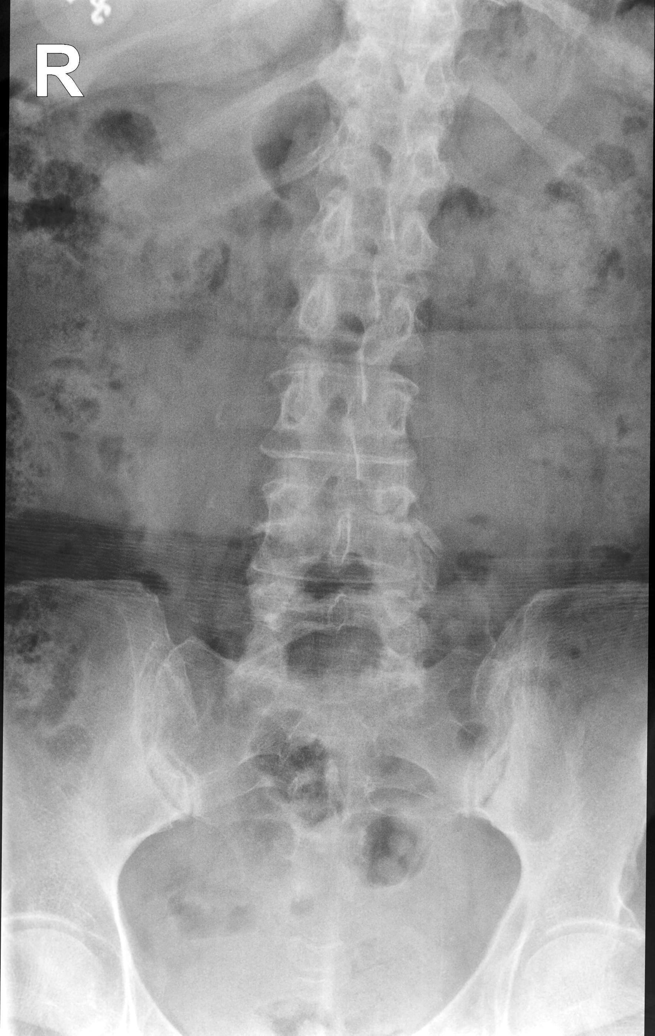

[lumbar spine oblique (1 of 2)]
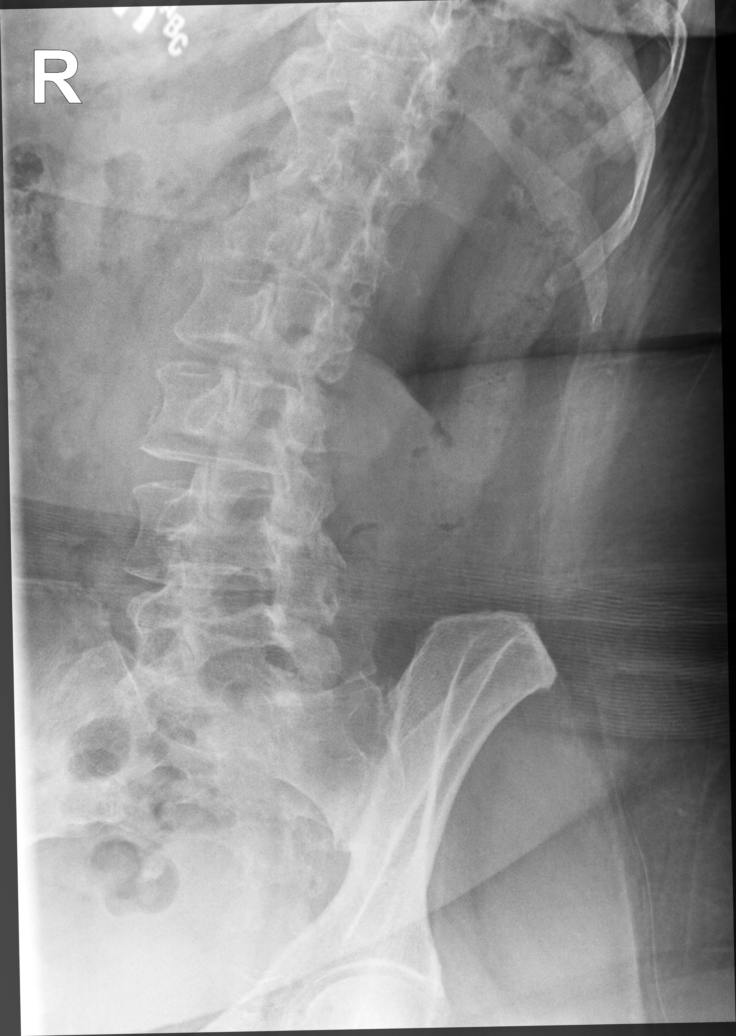

[lumbar spine oblique (2 of 2)]
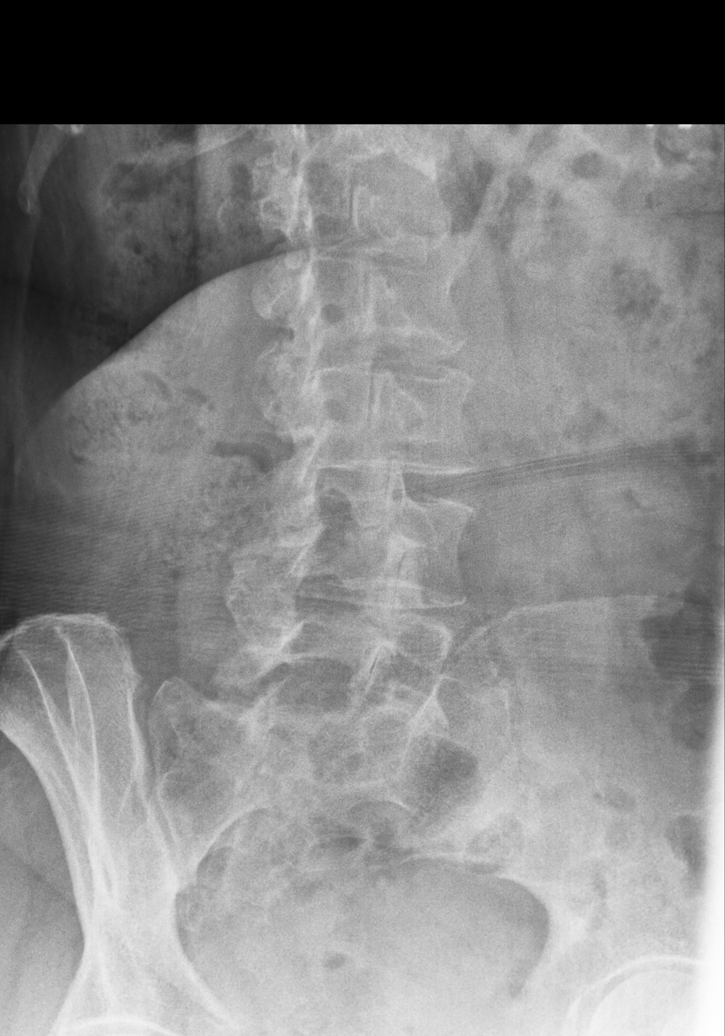

[lumbar spine lat (1 of 2)]
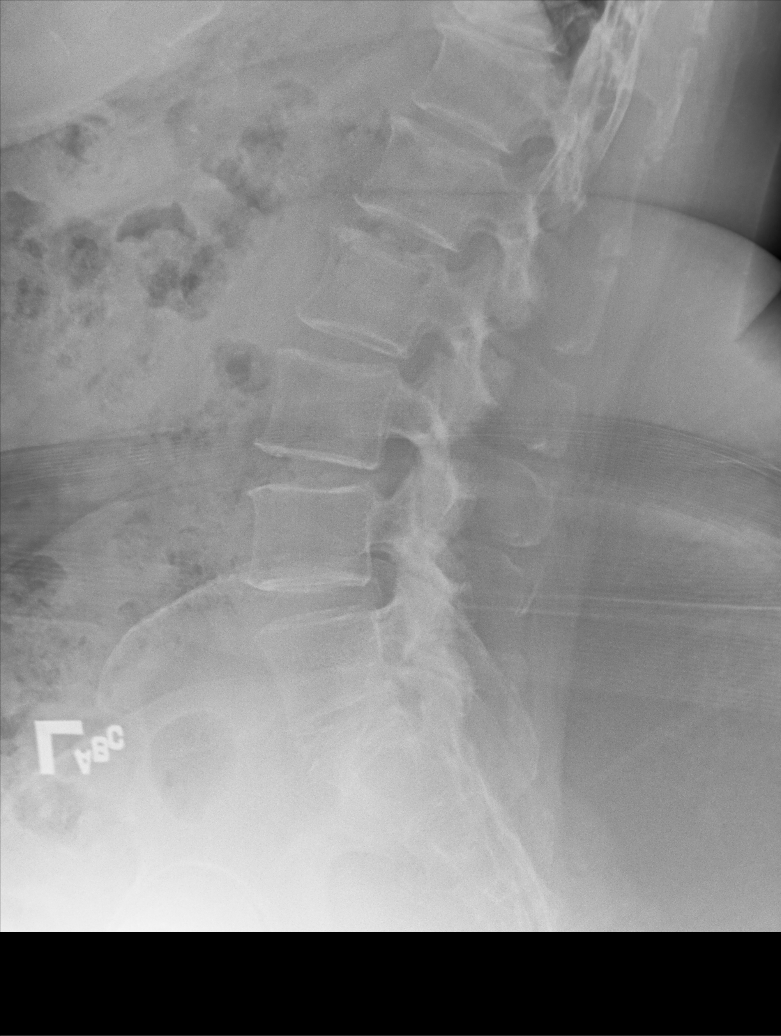

[lumbar spine lat (2 of 2)]
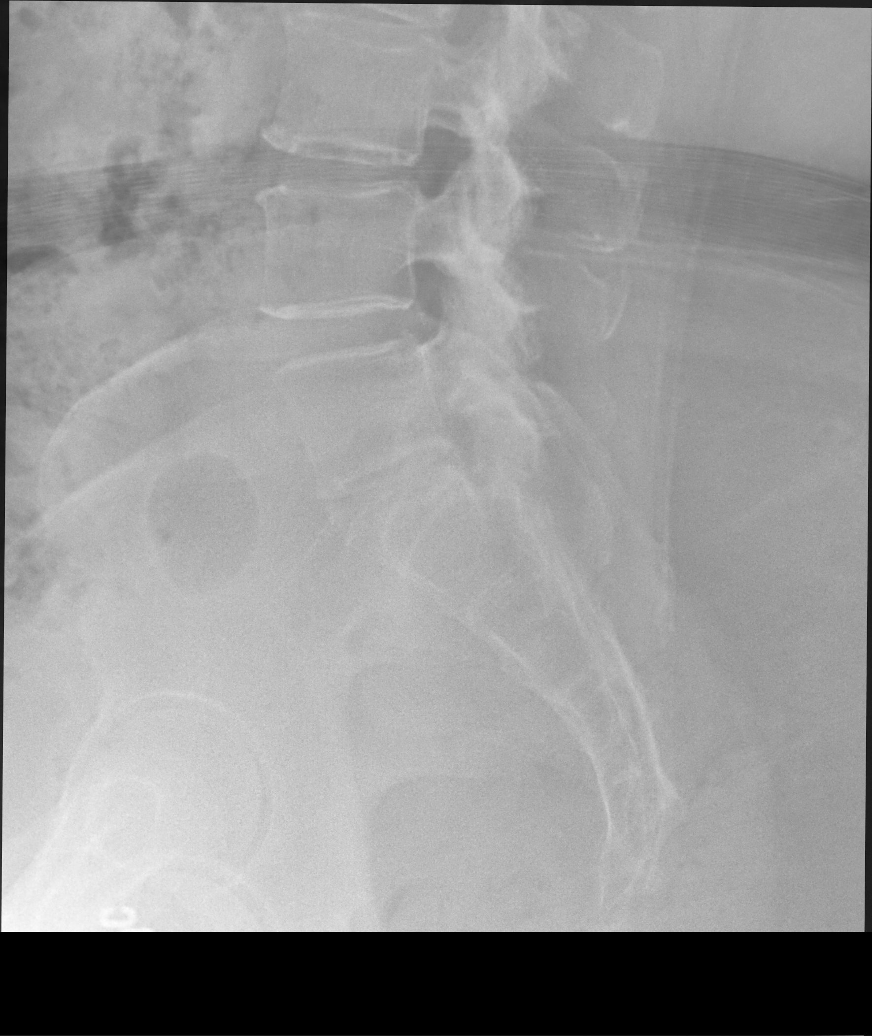

[5 of 5 positions shown; findings below may reference images not displayed]

FINDINGS: There is no fracture or malalignment. Intervertebral disc space
height is maintained. Lower lumbar facet arthropathy noted.
Paraspinous structures unremarkable.
IMPRESSION: No acute abnormality or change compared to the prior examination.
Lower lumbar facet arthropathy noted.

## 2022-03-04 ENCOUNTER — Encounter: Payer: Self-pay | Admitting: Internal Medicine

## 2022-03-04 ENCOUNTER — Ambulatory Visit (INDEPENDENT_AMBULATORY_CARE_PROVIDER_SITE_OTHER): Payer: Medicare Other | Admitting: Internal Medicine

## 2022-03-04 VITALS — BP 136/86 | HR 84 | Temp 98.3°F | Ht 67.0 in | Wt 272.0 lb

## 2022-03-04 DIAGNOSIS — G4486 Cervicogenic headache: Secondary | ICD-10-CM | POA: Diagnosis not present

## 2022-03-04 DIAGNOSIS — J069 Acute upper respiratory infection, unspecified: Secondary | ICD-10-CM

## 2022-03-04 MED ORDER — AMOXICILLIN-POT CLAVULANATE 875-125 MG PO TABS: 1.0000 | ORAL_TABLET | Freq: Two times a day (BID) | ORAL | 0 refills | Status: DC

## 2022-03-04 MED ORDER — MELOXICAM 15 MG PO TABS: 15.0000 mg | ORAL_TABLET | Freq: Every day | ORAL | 0 refills | Status: DC | PRN

## 2022-03-04 MED ORDER — HYDROCODONE BIT-HOMATROP MBR 5-1.5 MG/5ML PO SOLN: 5.0000 mL | Freq: Three times a day (TID) | ORAL | 0 refills | Status: DC | PRN

## 2022-03-04 NOTE — Patient Instructions (Addendum)
? ? ? ? ?  Medications changes include :   Augmentin twice daily x 10 days, meloxicam 15 mg daily as needed for headache.  Cough syrup for nighttime.  ? ? ?Your prescription(s) have been sent to your pharmacy.  ? ? ?Return if symptoms worsen or fail to improve. ? ?

## 2022-03-04 NOTE — Progress Notes (Signed)
? ? ?Subjective:  ? ? Patient ID: Lisa Mcconnell, female    DOB: Jan 25, 1956, 66 y.o.   MRN: [id number] ? ?This visit occurred during the SARS-CoV-2 public health emergency.  Safety protocols were in place, including screening questions prior to the visit, additional usage of staff PPE, and extensive cleaning of exam room while observing appropriate contact time as indicated for disinfecting solutions. ? ? ? ?HPI ?Lisa Mcconnell is here for  ?Chief Complaint  ?Patient presents with  ? Headache  ?  Not sure if its sinus or allergy related.   ? ? ? ?Right side of head pain -this morning she woke up with right-sided neck and head pain.  Initially she thought she slept wrong.  The neck pain did end up going away.  The head pain was a sharp, throbbing pain.  She denies any numbness or tingling.  She did take Tylenol and it did go away.  At 11:00 this morning the head pain came back and it has been on and off since then.  She is very nervous because her mother had a history of an aneurysm.  She has had imaging in the past and has no known aneurysms. ? ? ? ?Cough, mucus x 2 weeks  - taking robitussin DM .  For 2 weeks she has had nasal congestion, drainage down the back of her throat and cough that is productive at times.  It has not improved over the past 2 weeks.  Over-the-counter medication does not seem to helping.  She denies fevers, shortness of breath and wheezing.  She denies sinus pain. ? ? ? ?Medications and allergies reviewed with patient and updated if appropriate. ? ?Current Outpatient Medications on File Prior to Visit  ?Medication Sig Dispense Refill  ? acetaminophen (TYLENOL) 500 MG tablet Take 1,000 mg by mouth every 6 (six) hours as needed for moderate pain.    ? amLODipine (NORVASC) 5 MG tablet TAKE 1 TABLET (5 MG TOTAL) BY MOUTH DAILY. 90 tablet 1  ? aspirin EC 81 MG tablet Take 1 tablet (81 mg total) by mouth daily. 90 tablet 1  ? benzonatate (TESSALON) 100 MG capsule Take 1 capsule (100 mg total) by mouth 2  (two) times daily as needed for cough. 30 capsule 0  ? cephALEXin (KEFLEX) 500 MG capsule Take 1 capsule (500 mg total) by mouth 3 (three) times daily. 9 capsule 0  ? gabapentin (NEURONTIN) 100 MG capsule Take 1 capsule (100 mg total) by mouth 3 (three) times daily. 30 capsule 0  ? losartan-hydrochlorothiazide (HYZAAR) 100-25 MG tablet Take 1 tablet by mouth daily. 90 tablet 1  ? meloxicam (MOBIC) 15 MG tablet Take 1 tablet (15 mg total) by mouth daily as needed for pain. 30 tablet 0  ? ondansetron (ZOFRAN) 4 MG tablet Take 1 tablet (4 mg total) by mouth every 8 (eight) hours as needed for nausea or vomiting. 20 tablet 0  ? oxyCODONE-acetaminophen (PERCOCET/ROXICET) 5-325 MG tablet Take 1-2 tablets by mouth every 6 (six) hours as needed for severe pain. 20 tablet 0  ? trimethoprim-polymyxin b (POLYTRIM) ophthalmic solution Place 1 drop into the left eye every 4 (four) hours. 10 mL 0  ? ?No current facility-administered medications on file prior to visit.  ? ? ?Review of Systems  ?Constitutional:  Negative for chills and fever.  ?HENT:  Positive for congestion and postnasal drip. Negative for ear pain, sinus pressure, sinus pain and sore throat.   ?Eyes:  Negative for visual disturbance.  ?Respiratory:  Positive for cough. Negative for shortness of breath and wheezing.   ?Neurological:  Positive for headaches. Negative for dizziness, light-headedness and numbness.  ? ?   ?Objective:  ? ?Vitals:  ? 03/04/22 1448  ?BP: 136/86  ?Pulse: 84  ?Temp: 98.3 ?F (36.8 ?C)  ?SpO2: 95%  ? ?BP Readings from Last 3 Encounters:  ?03/04/22 136/86  ?02/09/22 136/79  ?01/31/22 (!) 163/77  ? ?Wt Readings from Last 3 Encounters:  ?03/04/22 272 lb (123.4 kg)  ?02/09/22 272 lb 12.8 oz (123.7 kg)  ?05/14/21 279 lb 4 oz (126.7 kg)  ? ?Body mass index is 42.6 kg/m?. ? ?  ?Physical Exam ?Constitutional:   ?   General: She is not in acute distress. ?   Appearance: Normal appearance. She is not ill-appearing.  ?HENT:  ?   Head: Normocephalic and  atraumatic.  ?   Comments: No tenderness right side of head with palpation ?   Right Ear: Tympanic membrane, ear canal and external ear normal.  ?   Left Ear: Tympanic membrane, ear canal and external ear normal.  ?   Mouth/Throat:  ?   Mouth: Mucous membranes are moist.  ?   Pharynx: No oropharyngeal exudate or posterior oropharyngeal erythema.  ?Eyes:  ?   Conjunctiva/sclera: Conjunctivae normal.  ?Cardiovascular:  ?   Rate and Rhythm: Normal rate and regular rhythm.  ?Pulmonary:  ?   Effort: Pulmonary effort is normal. No respiratory distress.  ?   Breath sounds: Normal breath sounds. No wheezing or rales.  ?Musculoskeletal:  ?   Cervical back: Neck supple. No swelling, deformity or tenderness.  ?Lymphadenopathy:  ?   Cervical: No cervical adenopathy.  ?Skin: ?   General: Skin is warm and dry.  ?Neurological:  ?   Mental Status: She is alert and oriented to person, place, and time.  ?   Cranial Nerves: No cranial nerve deficit, dysarthria or facial asymmetry.  ?   Sensory: No sensory deficit.  ?   Motor: No weakness.  ?Psychiatric:     ?   Mood and Affect: Mood normal.  ? ?   ? ? ? ? ? ?Assessment & Plan:  ? ? ?Right-sided head pain: ?Acute ?Likely cervicogenic ?This morning woke up with right-sided neck pain and head pain ?Neck pain has resolved and she has had intermittent right-sided head pain since then that is sharp and throbbing ?Discussed etiology-not an aneurysm ?No need for imaging-she is currently pain-free ?No neurological deficits ?Meloxicam 15 mg daily for the next couple of days.  Can apply ice to right side of neck ?Call if no improvement ? ?URI: ?Acute ?Given duration of symptoms we will go ahead and prescribe an antibiotic-Augmentin 875-125 mg twice daily x10 days ?Hycodan cough syrup 5 mils every 8 hours as needed ?Over-the-counter cold medications as needed ?Follow-up if no improvement ? ? ? ? ?

## 2022-03-22 ENCOUNTER — Ambulatory Visit (INDEPENDENT_AMBULATORY_CARE_PROVIDER_SITE_OTHER): Payer: Medicare Other | Admitting: Family Medicine

## 2022-03-22 ENCOUNTER — Encounter: Payer: Self-pay | Admitting: Family Medicine

## 2022-03-22 VITALS — BP 119/73 | HR 64 | Temp 97.3°F | Ht 67.0 in | Wt 274.0 lb

## 2022-03-22 DIAGNOSIS — I10 Essential (primary) hypertension: Secondary | ICD-10-CM

## 2022-03-22 DIAGNOSIS — J329 Chronic sinusitis, unspecified: Secondary | ICD-10-CM | POA: Diagnosis not present

## 2022-03-22 MED ORDER — AZELASTINE HCL 0.1 % NA SOLN: 2.0000 | Freq: Two times a day (BID) | NASAL | 12 refills | Status: DC

## 2022-03-22 MED ORDER — BENZONATATE 100 MG PO CAPS: 100.0000 mg | ORAL_CAPSULE | Freq: Two times a day (BID) | ORAL | 0 refills | Status: DC | PRN

## 2022-03-22 MED ORDER — AMOXICILLIN-POT CLAVULANATE 875-125 MG PO TABS: 1.0000 | ORAL_TABLET | Freq: Two times a day (BID) | ORAL | 0 refills | Status: DC

## 2022-03-22 NOTE — Progress Notes (Signed)
? ?  Lisa Mcconnell is a 66 y.o. female who presents today for an office visit. ? ?Assessment/Plan:  ?New/Acute Problems: ?Sinusitis ?No red flags.  We will send in another course of Augmentin.  Also start Astelin nasal spray.  We will also refill her Tessalon for cough.  She will let us know if not improving.  May consider imaging versus referral to ENT at that time.  We discussed reasons to return to care. ? ?Chronic Problems Addressed Today: ?Hypertension ?At goal today on amlodipine 5 mg daily and losartan-HCTZ 100-25 once daily ? ? ? ?  ?Subjective:  ?HPI: ? ?Patient here with sinus congestion. Started several weeks ago.  She saw the provider at a different office a couple weeks ago and was given a prescription for Augmentin.  This helped with symptoms however symptoms started returning after she finished the antibiotics.  She has tried several over-the-counter medications for allergies without much improvement.  No fevers or chills.  A lot of facial pain.  Some yellow discharge and drainage.  Some cough. ? ?   ?  ?Objective:  ?Physical Exam: ?BP 119/73   Pulse 64   Temp (!) 97.3 ?F (36.3 ?C) (Temporal)   Ht '5\' 7"'$  (1.702 m)   Wt 274 lb (124.3 kg)   SpO2 98%   BMI 42.91 kg/m?   ?Gen: No acute distress, resting comfortably ?HEENT: TMs with clear effusion.  OP erythematous.  Nasal mucosa erythematous and boggy bilaterally. ?CV: Regular rate and rhythm with no murmurs appreciated ?Pulm: Normal work of breathing, clear to auscultation bilaterally with no crackles, wheezes, or rhonchi ?Neuro: Grossly normal, moves all extremities ?Psych: Normal affect and thought content ? ?   ? ?Algis Greenhouse. Jerline Pain, MD ?03/22/2022 12:27 PM  ?

## 2022-03-22 NOTE — Patient Instructions (Signed)
It was very nice to see you today! ? ?You have a sinus infection.  Please start the nasal spray, antibiotic, and cough medication.  Let us know if not improving. ? ?Take care, ?Dr Jerline Pain ? ?PLEASE NOTE: ? ?If you had any lab tests please let us know if you have not heard back within a few days. You may see your results on mychart before we have a chance to review them but we will give you a call once they are reviewed by [place]. If we ordered any referrals today, please let us know if you have not heard from their office within the next week.  ? ?Please try these tips to maintain a healthy lifestyle: ? ?Eat at least 3 REAL meals and 1-2 snacks per day.  Aim for no more than 5 hours between eating.  If you eat breakfast, please do so within one hour of getting up.  ? ?Each meal should contain half fruits/vegetables, one quarter protein, and one quarter carbs (no bigger than a computer mouse) ? ?Cut down on sweet beverages. This includes juice, soda, and sweet tea.  ? ?Drink at least 1 glass of water with each meal and aim for at least 8 glasses per day ? ?Exercise at least 150 minutes every week.   ?

## 2022-04-24 ENCOUNTER — Other Ambulatory Visit: Payer: Self-pay | Admitting: Family Medicine

## 2022-04-24 DIAGNOSIS — I10 Essential (primary) hypertension: Secondary | ICD-10-CM

## 2022-06-03 ENCOUNTER — Encounter: Payer: Self-pay | Admitting: Physician Assistant

## 2022-06-03 ENCOUNTER — Ambulatory Visit (INDEPENDENT_AMBULATORY_CARE_PROVIDER_SITE_OTHER): Payer: Medicare Other | Admitting: Physician Assistant

## 2022-06-03 VITALS — BP 134/78 | HR 58 | Temp 98.6°F | Ht 67.0 in | Wt 270.0 lb

## 2022-06-03 DIAGNOSIS — G8929 Other chronic pain: Secondary | ICD-10-CM | POA: Diagnosis not present

## 2022-06-03 DIAGNOSIS — M546 Pain in thoracic spine: Secondary | ICD-10-CM

## 2022-06-03 DIAGNOSIS — G5700 Lesion of sciatic nerve, unspecified lower limb: Secondary | ICD-10-CM | POA: Diagnosis not present

## 2022-06-03 MED ORDER — PREDNISONE 20 MG PO TABS: 40.0000 mg | ORAL_TABLET | Freq: Every day | ORAL | 0 refills | Status: DC

## 2022-06-03 MED ORDER — GABAPENTIN 100 MG PO CAPS: 100.0000 mg | ORAL_CAPSULE | Freq: Three times a day (TID) | ORAL | 1 refills | Status: DC

## 2022-06-03 NOTE — Progress Notes (Signed)
Lisa Mcconnell is a 66 y.o. female here for a new problem.  History of Present Illness:   Chief Complaint  Patient presents with   Hip Pain    X 5 days.     HPI  Hip pain/sciatica Bilateral hip pain for 5 days. Left more than right. Denies any injury. Pain radiates down leg to back of calf. States pain can get to 9/10 when sitting or laying down. Has improvement when walking around. Patient has had similar symptoms in the past but never lasted more than 2-3 days. Has been using tylenol and ibuprofen with minimal improvement. She has used heat but not helpful at all.  Denies: urinary issues, weakness, numbness/tingling.   Chronic R sided back pain Ongoing x 2.5 years. Has had at least two visits at our office for this. Denies worsening but frustrated it persists.     Past Medical History:  Diagnosis Date   ACE-inhibitor cough    Anemia    Arthritis    Chronic constipation    Hemorrhoids    Hypertension    Obesity      Social History   Tobacco Use   Smoking status: Never   Smokeless tobacco: Never  Substance Use Topics   Alcohol use: No   Drug use: No    Past Surgical History:  Procedure Laterality Date   CESAREAN SECTION     x 3    COLONOSCOPY     left knee replacement  03/2008   TOTAL KNEE ARTHROPLASTY Right 10/12/2015   TOTAL KNEE ARTHROPLASTY Right 10/12/2015   Procedure: TOTAL KNEE ARTHROPLASTY;  Surgeon: Frederik Pear, MD;  Location: [facility];  Service: Orthopedics;  Laterality: Right;    Family History  Problem Relation Age of Onset   Hypertension Mother    Stroke Mother    Kidney failure Mother    Aneurysm Mother    Alcohol abuse Father    Hypertension Father    Heart attack Father    Heart disease Maternal Grandmother    Heart disease Maternal Grandfather    Heart disease Paternal Grandmother    Heart disease Paternal Grandfather     Allergies  Allergen Reactions   Benazepril Hcl Cough    Current Medications:   Current Outpatient  Medications:    acetaminophen (TYLENOL) 500 MG tablet, Take 1,000 mg by mouth every 6 (six) hours as needed for moderate pain., Disp: , Rfl:    amLODipine (NORVASC) 5 MG tablet, TAKE 1 TABLET BY MOUTH DAILY, Disp: 90 tablet, Rfl: 3   aspirin EC 81 MG tablet, Take 1 tablet (81 mg total) by mouth daily., Disp: 90 tablet, Rfl: 1   losartan-hydrochlorothiazide (HYZAAR) 100-25 MG tablet, TAKE 1 TABLET BY MOUTH DAILY, Disp: 90 tablet, Rfl: 3   predniSONE (DELTASONE) 20 MG tablet, Take 2 tablets (40 mg total) by mouth daily., Disp: 10 tablet, Rfl: 0   gabapentin (NEURONTIN) 100 MG capsule, Take 1 capsule (100 mg total) by mouth 3 (three) times daily., Disp: 60 capsule, Rfl: 1   meloxicam (MOBIC) 15 MG tablet, Take 1 tablet (15 mg total) by mouth daily as needed for pain. (Patient not taking: Reported on 06/03/2022), Disp: 30 tablet, Rfl: 0   Review of Systems:   ROS Negative unless otherwise specified per HPI.   Vitals:   Vitals:   06/03/22 1141  BP: 134/78  Pulse: (!) 58  Temp: 98.6 F (37 C)  TempSrc: Oral  SpO2: 98%  Weight: 270 lb (122.5 kg)  Height:  $'5\' 7"'N$  (1.702 m)     Body mass index is 42.29 kg/m.  Physical Exam:   Physical Exam Vitals and nursing note reviewed.  Constitutional:      General: She is not in acute distress.    Appearance: She is well-developed. She is not ill-appearing or toxic-appearing.  Cardiovascular:     Rate and Rhythm: Normal rate and regular rhythm.     Pulses: Normal pulses.     Heart sounds: Normal heart sounds, S1 normal and S2 normal.  Pulmonary:     Effort: Pulmonary effort is normal.     Breath sounds: Normal breath sounds.  Musculoskeletal:     Comments: No decreased ROM 2/2 pain with flexion/extension, lateral side bends, or rotation. Reproducible tenderness with deep palpation to bilateral SI joint muscles. No bony tenderness. No evidence of erythema, rash or ecchymosis.    Skin:    General: Skin is warm and dry.  Neurological:      Mental Status: She is alert.     GCS: GCS eye subscore is 4. GCS verbal subscore is 5. GCS motor subscore is 6.  Psychiatric:        Speech: Speech normal.        Behavior: Behavior normal. Behavior is cooperative.     Assessment and Plan:   Chronic right-sided thoracic back pain Ongoing Referral to sports med to help [place] further investigate  Piriformis syndrome, unspecified laterality No red flags Start prednisone 40 mg daily x 5 days She has been on gabapentin in the past for her foot and would like to restart so this was sent in as well    Inda Coke, PA-C

## 2022-06-03 NOTE — Patient Instructions (Signed)
It was great to see you!  A referral has been placed for you to see one of our fantastic providers at [place]. Someone from their office will be in touch soon regarding scheduling your appointment.  Their location:  [place] [facility]  [address] on the 1st floor Phone number [phone] Fax [phone].   This location is across the street from the entrance to [employer] and in the same complex as the [facility] prednisone for your pain  May take gabapentin as well  Take care,  Inda Coke PA-C

## 2022-06-10 ENCOUNTER — Ambulatory Visit (INDEPENDENT_AMBULATORY_CARE_PROVIDER_SITE_OTHER): Payer: Medicare Other

## 2022-06-10 ENCOUNTER — Ambulatory Visit: Payer: Medicare Other | Admitting: Family Medicine

## 2022-06-10 VITALS — BP 140/74 | HR 67 | Ht 67.0 in | Wt 273.4 lb

## 2022-06-10 DIAGNOSIS — M4316 Spondylolisthesis, lumbar region: Secondary | ICD-10-CM | POA: Diagnosis not present

## 2022-06-10 DIAGNOSIS — M5441 Lumbago with sciatica, right side: Secondary | ICD-10-CM | POA: Diagnosis not present

## 2022-06-10 DIAGNOSIS — G8929 Other chronic pain: Secondary | ICD-10-CM | POA: Diagnosis not present

## 2022-06-10 DIAGNOSIS — M546 Pain in thoracic spine: Secondary | ICD-10-CM | POA: Diagnosis not present

## 2022-06-10 DIAGNOSIS — M5442 Lumbago with sciatica, left side: Secondary | ICD-10-CM | POA: Diagnosis not present

## 2022-06-10 DIAGNOSIS — M545 Low back pain, unspecified: Secondary | ICD-10-CM | POA: Diagnosis not present

## 2022-06-10 DIAGNOSIS — M549 Dorsalgia, unspecified: Secondary | ICD-10-CM | POA: Diagnosis not present

## 2022-06-10 MED ORDER — TIZANIDINE HCL 4 MG PO TABS: 4.0000 mg | ORAL_TABLET | Freq: Three times a day (TID) | ORAL | 1 refills | Status: DC | PRN

## 2022-06-10 NOTE — Progress Notes (Signed)
I, Peterson Lombard, LAT, ATC acting as a scribe for Lynne Leader, MD.  Subjective:    CC: Thoracic and low back pain  HPI: Pt is a 66 y/o female c/o thoracic back pain ongoing for over a year. Pt locates pain to the R-side of her mid and low back. Pain location will vary and something will move proximally to around the rhomboids. Pt works as a [employer] certified accountant.   Radiating pain: no LE numbness/tingling: no LE weakness: no Aggravates: nothing in particular Treatments tried: Tylenol, naproxen, tramadol, hydrocodone  Dx imaging: 01/08/20 T-spine & L-spine XR  07/14/18 T-spine XR  04/15/2017 L-spine MRI  04/06/17 L-spine XR  Pertinent review of Systems: No fevers or chills  Relevant historical information: Hypertension   Objective:    Vitals:   06/10/22 1100  BP: (!) 140/74  Pulse: 67  SpO2: 98%   General: Well Developed, well nourished, and in no acute distress.   MSK: T-spine: Nontender midline. Tender palpation right thoracic paraspinal musculature. Normal thoracic motion. Normal scapular motion.  L-spine: Nontender midline.  Normal-appearing. Tender palpation right lumbar paraspinal musculature. Decreased lumbar motion. Lower extremity strength reflexes and sensation are intact distally.  Lab and Radiology Results  X-ray images T-spine and L-spine obtained today personally and independently interpreted.  T-spine: DDD T11-12.  Mid thoracic lateral view difficult to see vertebrae due to inadequate penetration.  L-spine: DDD worse at L5-S1.  Anterior listhesis L4-L5.  Await formal radiology review    Impression and Recommendations:    Assessment and Plan: 66 y.o. female with  Chronic right thoracic back pain and right low back pain.  Pain thought to be due to muscle spasm and dysfunction of rhomboid and right lumbar paraspinal musculature or quadratus lumborum.  She is a good candidate for physical therapy trial.  She has had multiple different medications including  tramadol and hydrocodone.  Recommend heat and add trial of tizanidine.  However main intervention will be physical therapy.  Recheck in 6 weeks.Marland Kitchen  PDMP not reviewed this encounter. Orders Placed This Encounter  Procedures   DG Thoracic Spine W/Swimmers    Standing Status:   Future    Number of Occurrences:   1    Standing Expiration Date:   06/11/2023    Order Specific Question:   Reason for Exam (SYMPTOM  OR DIAGNOSIS REQUIRED)    Answer:   thoracic back pain    Order Specific Question:   Preferred imaging location?    Answer:   [facility]   DG Lumbar Spine 2-3 Views    Standing Status:   Future    Number of Occurrences:   1    Standing Expiration Date:   07/11/2022    Order Specific Question:   Reason for Exam (SYMPTOM  OR DIAGNOSIS REQUIRED)    Answer:   back pain    Order Specific Question:   Preferred imaging location?    Answer:   [facility]   Ambulatory referral to Physical Therapy    Referral Priority:   Routine    Referral Type:   Physical Medicine    Referral Reason:   Specialty Services Required    Requested Specialty:   Physical Therapy    Number of Visits Requested:   1   Meds ordered this encounter  Medications   tiZANidine (ZANAFLEX) 4 MG tablet    Sig: Take 1 tablet (4 mg total) by mouth every 8 (eight) hours as needed for muscle spasms.  Dispense:  90 tablet    Refill:  1    Discussed warning signs or symptoms. Please see discharge instructions. Patient expresses understanding.   The above documentation has been reviewed and is accurate and complete Lynne Leader, M.D.

## 2022-06-10 NOTE — Patient Instructions (Addendum)
Thank you for coming in today.   Please get an Xray today before you leave   I've referred you to Physical Therapy.  Let us know if you don't hear from them in one week.   Recheck in 6 weeks.   Try tizanidine muscle relaxer.

## 2022-06-13 NOTE — Progress Notes (Signed)
Lumbar spine x-ray shows some mild arthritis changes

## 2022-06-13 NOTE — Progress Notes (Signed)
Thoracic spine x-ray shows no compression fractures

## 2022-07-05 ENCOUNTER — Ambulatory Visit: Payer: Medicare Other | Admitting: Physical Therapy

## 2022-07-09 ENCOUNTER — Other Ambulatory Visit: Payer: Self-pay | Admitting: Family Medicine

## 2022-07-12 NOTE — Telephone Encounter (Signed)
Rx refill request approved per Dr. Corey's orders. 

## 2022-07-22 ENCOUNTER — Ambulatory Visit: Payer: Medicare Other | Admitting: Family Medicine

## 2022-07-22 NOTE — Progress Notes (Deleted)
   I, Peterson Lombard, LAT, ATC acting as a scribe for Lynne Leader, MD.  Lisa Mcconnell is a 66 y.o. female who presents to [place] at [facility] today for f/u chronic R-sided thoracic back pain and LBP. Pt was last seen by Dr. Georgina Snell on 06/10/22 and was prescribed tizanidine, advised to use a heating pad, and was referred to PT, but pt did not schedule any visits. Today, pt reports  Dx imaging: 06/10/22 L-spine & t-spine XR 01/08/20 T-spine & L-spine XR             07/14/18 T-spine XR             04/15/2017 L-spine MRI             04/06/17 L-spine XR  Pertinent review of systems: ***  Relevant historical information: ***   Exam:  There were no vitals taken for this visit. General: Well Developed, well nourished, and in no acute distress.   MSK: ***    Lab and Radiology Results No results found for this or any previous visit (from the past 72 hour(s)). No results found.     Assessment and Plan: 66 y.o. female with ***   PDMP not reviewed this encounter. No orders of the defined types were placed in this encounter.  No orders of the defined types were placed in this encounter.    Discussed warning signs or symptoms. Please see discharge instructions. Patient expresses understanding.   ***

## 2022-07-25 ENCOUNTER — Other Ambulatory Visit: Payer: Self-pay | Admitting: Family Medicine

## 2022-07-26 NOTE — Telephone Encounter (Signed)
Rx refill request approved per Dr. Corey's orders. 

## 2023-01-02 DIAGNOSIS — M542 Cervicalgia: Secondary | ICD-10-CM | POA: Diagnosis not present

## 2023-01-02 DIAGNOSIS — M79645 Pain in left finger(s): Secondary | ICD-10-CM | POA: Diagnosis not present

## 2023-01-06 DIAGNOSIS — M791 Myalgia, unspecified site: Secondary | ICD-10-CM | POA: Diagnosis not present

## 2023-01-06 DIAGNOSIS — M5412 Radiculopathy, cervical region: Secondary | ICD-10-CM | POA: Diagnosis not present

## 2023-01-06 DIAGNOSIS — M65312 Trigger thumb, left thumb: Secondary | ICD-10-CM | POA: Diagnosis not present

## 2023-01-12 ENCOUNTER — Telehealth: Payer: Self-pay

## 2023-01-12 NOTE — Telephone Encounter (Signed)
Called patient to schedule Medicare Annual Wellness Visit (AWV). Unable to reach patient.  Last date of AWV: eligible 11/07/22 for AWV-I  Please schedule an appointment at any time with [facility].   Norton Blizzard, [place] (AAMA)  [place] [facility] [phone]

## 2023-01-19 DIAGNOSIS — M5412 Radiculopathy, cervical region: Secondary | ICD-10-CM | POA: Diagnosis not present

## 2023-01-24 ENCOUNTER — Encounter: Payer: Medicare Other | Admitting: Physician Assistant

## 2023-02-07 DIAGNOSIS — M5412 Radiculopathy, cervical region: Secondary | ICD-10-CM | POA: Diagnosis not present

## 2023-02-07 DIAGNOSIS — M65312 Trigger thumb, left thumb: Secondary | ICD-10-CM | POA: Diagnosis not present

## 2023-02-14 DIAGNOSIS — M542 Cervicalgia: Secondary | ICD-10-CM | POA: Diagnosis not present

## 2023-02-27 DIAGNOSIS — M5412 Radiculopathy, cervical region: Secondary | ICD-10-CM | POA: Diagnosis not present

## 2023-03-02 ENCOUNTER — Other Ambulatory Visit: Payer: Self-pay | Admitting: Physician Assistant

## 2023-03-02 DIAGNOSIS — I10 Essential (primary) hypertension: Secondary | ICD-10-CM

## 2023-03-06 DIAGNOSIS — M5412 Radiculopathy, cervical region: Secondary | ICD-10-CM | POA: Diagnosis not present

## 2023-03-27 DIAGNOSIS — M5412 Radiculopathy, cervical region: Secondary | ICD-10-CM | POA: Diagnosis not present

## 2023-05-10 NOTE — Progress Notes (Shared)
Subjective:    Lisa Mcconnell is a 67 y.o. female and is here for a comprehensive physical exam.  HPI  Health Maintenance Due  Topic Date Due   Medicare Annual Wellness (AWV)  Never done   Zoster Vaccines- Shingrix (1 of 2) Never done   MAMMOGRAM  08/24/2017   Colonoscopy  09/19/2018   Pneumonia Vaccine 69+ Years old (1 of 1 - PCV) Never done   DEXA SCAN  Never done   COVID-19 Vaccine (3 - 2023-24 season) 07/08/2022    Acute Concerns: ***  Chronic Issues: Hypertension Treated with amlodipine 5 mg daily, losartan-hydrochlorothiazide 100-25 mg daily.  Musculoskeletal Pain; Muscle Spasms Treated with prednisone 40 mg daily, tizanidine 4 mg every 8 hours as needed, gabapentin 100 mg three times daily.  Health Maintenance: Immunizations -- due for shingles, pneumonia vaccines. UTD on tetanus vaccine. Colonoscopy -- Last completed November 2009. It was unremarkable other than internal hemorrhoids. Mammogram -- Due for repeat study. PAP -- Deferred due to age. Bone Density -- Due for first study. Diet -- *** Exercise -- ***  Sleep habits -- *** Mood -- ***  UTD with dentist? - *** UTD with eye doctor? - ***  Weight history: Wt Readings from Last 10 Encounters:  06/10/22 273 lb 6.4 oz (124 kg)  06/03/22 270 lb (122.5 kg)  03/22/22 274 lb (124.3 kg)  03/04/22 272 lb (123.4 kg)  02/09/22 272 lb 12.8 oz (123.7 kg)  05/14/21 279 lb 4 oz (126.7 kg)  03/30/21 278 lb 9.6 oz (126.4 kg)  01/08/20 276 lb 8 oz (125.4 kg)  12/20/18 278 lb 6.1 oz (126.3 kg)  11/21/18 279 lb (126.6 kg)   There is no height or weight on file to calculate BMI. No LMP recorded. Patient is postmenopausal.  Alcohol use:  reports no history of alcohol use.  Tobacco use:  Tobacco Use: Low Risk  (06/03/2022)   Patient History    Smoking Tobacco Use: Never    Smokeless Tobacco Use: Never    Passive Exposure: Not on file   Eligible for lung cancer screening? ***     03/22/2022   11:50 AM   Depression screen PHQ 2/9  Decreased Interest 0  Down, Depressed, Hopeless 0  PHQ - 2 Score 0     Other providers/specialists: Patient Care Team: Jarold Motto, [place] as PCP - General (Physician Assistant)    PMHx, SurgHx, SocialHx, Medications, and Allergies were reviewed in the Visit Navigator and updated as appropriate.   Past Medical History:  Diagnosis Date   ACE-inhibitor cough    Anemia    Arthritis    Chronic constipation    Hemorrhoids    Hypertension    Obesity      Past Surgical History:  Procedure Laterality Date   CESAREAN SECTION     x 3    COLONOSCOPY     left knee replacement  03/2008   TOTAL KNEE ARTHROPLASTY Right 10/12/2015   TOTAL KNEE ARTHROPLASTY Right 10/12/2015   Procedure: TOTAL KNEE ARTHROPLASTY;  Surgeon: Gean Birchwood, MD;  Location: [facility];  Service: Orthopedics;  Laterality: Right;     Family History  Problem Relation Age of Onset   Hypertension Mother    Stroke Mother    Kidney failure Mother    Aneurysm Mother    Alcohol abuse Father    Hypertension Father    Heart attack Father    Heart disease Maternal Grandmother    Heart disease Maternal Grandfather  Heart disease Paternal Grandmother    Heart disease Paternal Grandfather     Social History   Tobacco Use   Smoking status: Never   Smokeless tobacco: Never  Substance Use Topics   Alcohol use: No   Drug use: No    Review of Systems:   ROS  Objective:   There were no vitals taken for this visit. There is no height or weight on file to calculate BMI.   General Appearance:    Alert, cooperative, no distress, appears stated age  Head:    Normocephalic, without obvious abnormality, atraumatic  Eyes:    PERRL, conjunctiva/corneas clear, EOM's intact, fundi    benign, both eyes  Ears:    Normal TM's and external ear canals, both ears  Nose:   Nares normal, septum midline, mucosa normal, no drainage    or sinus tenderness  Throat:   Lips, mucosa, and tongue normal;  teeth and gums normal  Neck:   Supple, symmetrical, trachea midline, no adenopathy;    thyroid:  no enlargement/tenderness/nodules; no carotid   bruit or JVD  Back:     Symmetric, no curvature, ROM normal, no CVA tenderness  Lungs:     Clear to auscultation bilaterally, respirations unlabored  Chest Wall:    No tenderness or deformity   Heart:    Regular rate and rhythm, S1 and S2 normal, no murmur, rub or gallop  Breast Exam:    ***No tenderness, masses, or nipple abnormality  Abdomen:     Soft, non-tender, bowel sounds active all four quadrants,    no masses, no organomegaly  Genitalia:    ***Normal female without lesion, discharge or tenderness  Extremities:   Extremities normal, atraumatic, no cyanosis or edema  Pulses:   2+ and symmetric all extremities  Skin:   Skin color, texture, turgor normal, no rashes or lesions  Lymph nodes:   Cervical, supraclavicular, and axillary nodes normal  Neurologic:   CNII-XII intact, normal strength, sensation and reflexes    throughout    Assessment/Plan:   ***   I,Alexander Ruley,acting as a scribe for [employer], PA.,have documented all relevant documentation on the behalf of Jarold Motto, PA,as directed by  Jarold Motto, PA while in the presence of Jarold Motto, [place].   ***   Jarold Motto, PA-C [facility] [place]

## 2023-05-11 ENCOUNTER — Other Ambulatory Visit: Payer: Self-pay | Admitting: Physician Assistant

## 2023-05-11 DIAGNOSIS — I10 Essential (primary) hypertension: Secondary | ICD-10-CM

## 2023-05-12 NOTE — Telephone Encounter (Signed)
LAST APPOINTMENT DATE: 06/04/2023  NEXT APPOINTMENT DATE: 05/17/2023

## 2023-05-17 ENCOUNTER — Encounter: Payer: Medicare Other | Admitting: Physician Assistant

## 2023-06-05 ENCOUNTER — Encounter: Payer: Medicare Other | Admitting: Physician Assistant

## 2023-07-07 ENCOUNTER — Encounter: Payer: Medicare Other | Admitting: Physician Assistant

## 2023-07-20 ENCOUNTER — Other Ambulatory Visit: Payer: Self-pay | Admitting: Physician Assistant

## 2023-07-20 DIAGNOSIS — I10 Essential (primary) hypertension: Secondary | ICD-10-CM

## 2023-09-06 ENCOUNTER — Encounter: Payer: Medicare Other | Admitting: Physician Assistant

## 2023-10-31 ENCOUNTER — Ambulatory Visit: Payer: Medicare Other | Admitting: Family Medicine

## 2024-01-22 ENCOUNTER — Telehealth: Payer: Self-pay

## 2024-01-22 NOTE — Telephone Encounter (Signed)
 On 3.14.25, I noticed patient was scheduled for an OV with Dr. Jimmey Ralph but the notes stated she wanted to establish care with Dr. Hyman Hopes, who isn't at this office. Patient is also a previous patient of Samantha Worley's and Dr. Jimmey Ralph isn't currently taking new patients. I called patient on 01/19/24 and today but had to LVM both times. Within the VM I asked patient to call back so we can get clarification on what she is needing.

## 2024-02-02 ENCOUNTER — Ambulatory Visit: Payer: Medicare Other | Admitting: Family Medicine

## 2024-02-02 NOTE — Telephone Encounter (Signed)
 LVM again & canceled appt due to no patient response.

## 2024-02-24 IMAGING — US US EXTREM LOW*L* LIMITED
1 series · 12 of 12 positions shown · non-contrast
Comparison: Left ankle x-rays dated December 23, 2021.

CLINICAL DATA: Left ankle mass.

EXAM:
ULTRASOUND LEFT LOWER EXTREMITY LIMITED
TECHNIQUE: Ultrasound examination of the lower extremity soft tissues was
performed in the area of clinical concern.

[Series 1: us extrem low*left* limited · 12 acquisitions, 12 frames shown]
[im 1/12]
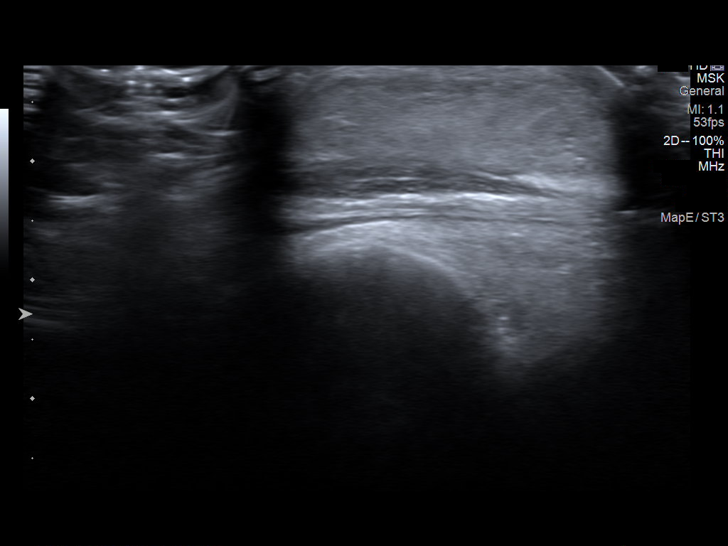
[im 2/12]
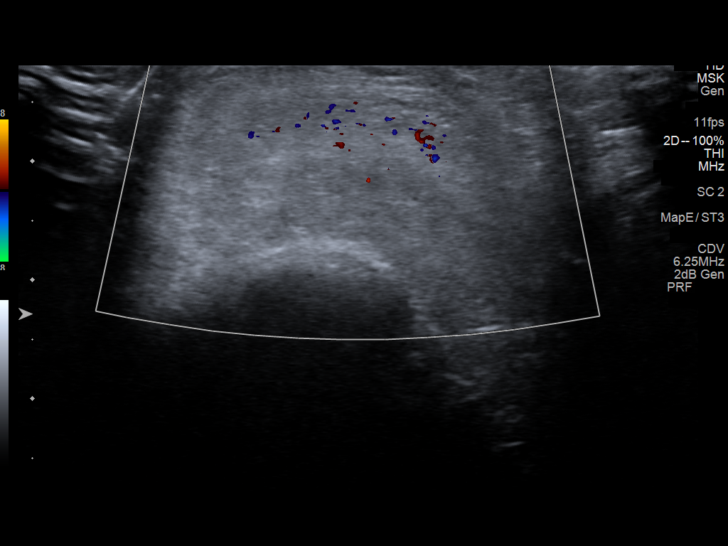
[im 3/12]
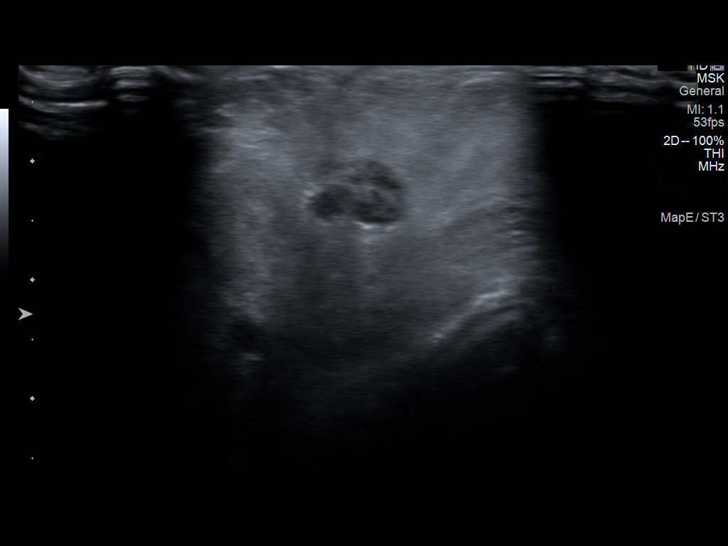
[im 4/12]
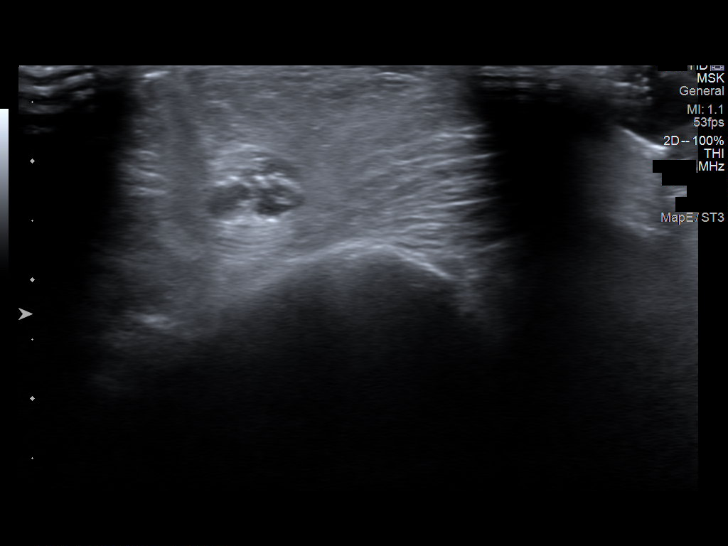
[im 5/12]
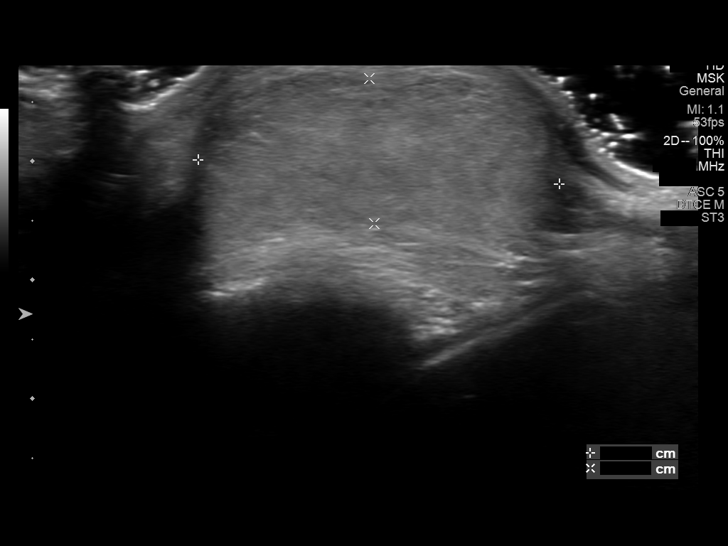
[im 6/12]
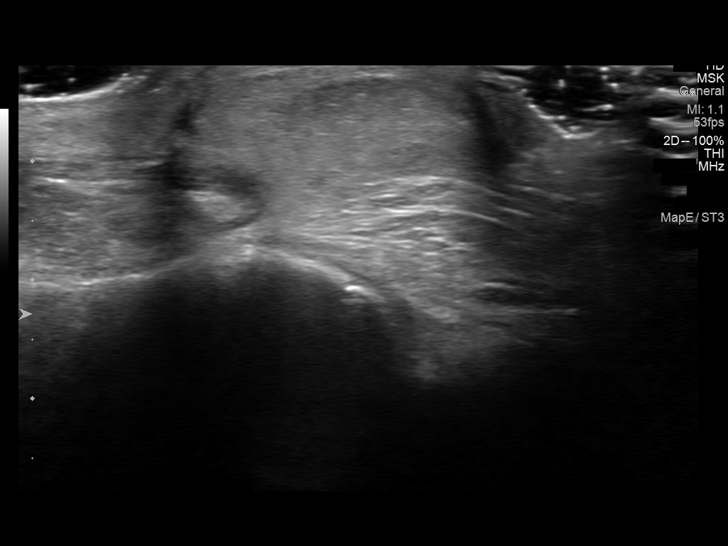
[im 7/12]
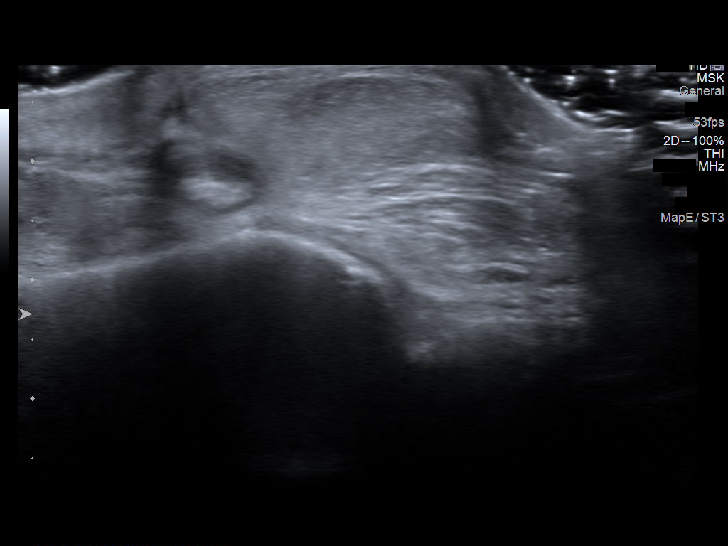
[im 8/12]
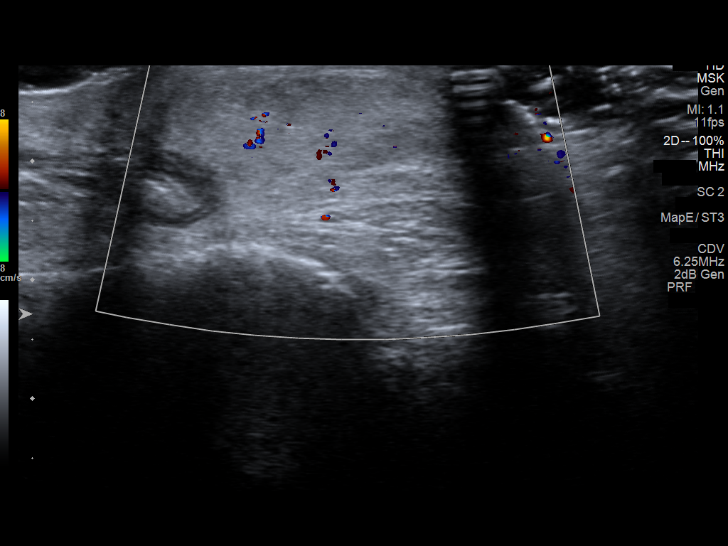
[im 9/12]
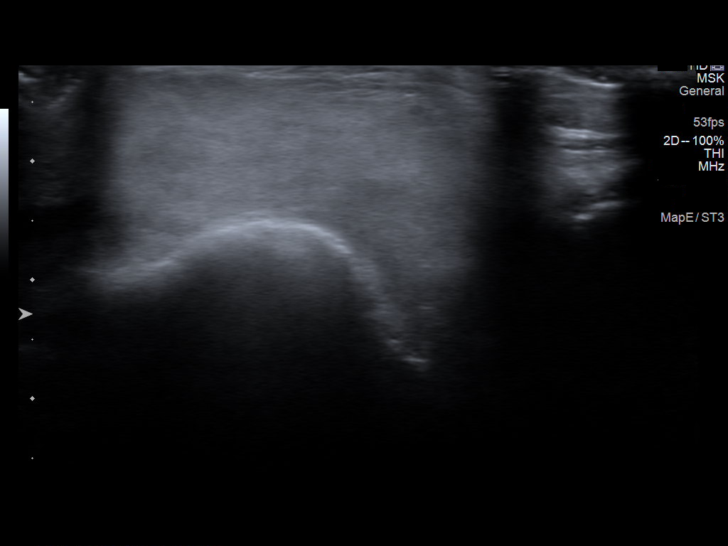
[im 10/12]
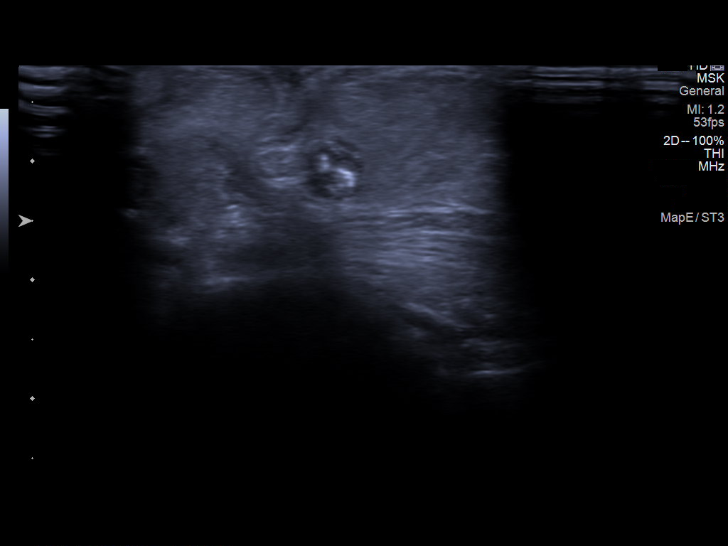
[im 11/12]
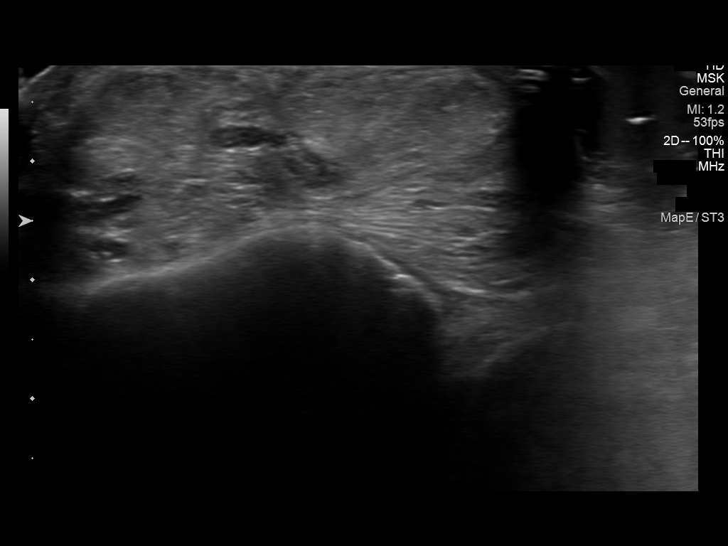
[im 12/12]
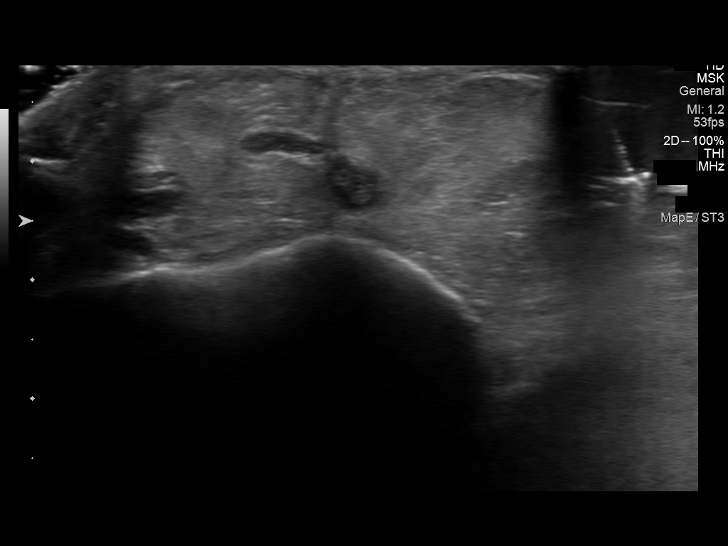

[12 of 12 positions shown; findings below may reference images not displayed]

FINDINGS: Focused ultrasound of the palpable abnormality along the
anterolateral left ankle demonstrates a 3.0 x 4.2 x 1.2 cm lobulated
mixed echogenicity solid mass surrounding the extensor tendons with
mild internal vascularity.
IMPRESSION: 1. 4.2 cm soft tissue mass surrounding the extensor tendons of the
left ankle. This is not clearly a lipoma, although a fatty neoplasm
is in the differential, which also includes giant cell tumor of the
tendon sheath given the mass's intimate association with the
extensor tendons. Recommend further evaluation with MRI of the left
ankle with and without contrast or tissue sampling.

## 2024-02-27 ENCOUNTER — Ambulatory Visit: Payer: Self-pay

## 2024-02-27 ENCOUNTER — Emergency Department (HOSPITAL_BASED_OUTPATIENT_CLINIC_OR_DEPARTMENT_OTHER): Admitting: Radiology

## 2024-02-27 ENCOUNTER — Encounter (HOSPITAL_BASED_OUTPATIENT_CLINIC_OR_DEPARTMENT_OTHER): Payer: Self-pay | Admitting: Emergency Medicine

## 2024-02-27 ENCOUNTER — Other Ambulatory Visit: Payer: Self-pay

## 2024-02-27 ENCOUNTER — Emergency Department (HOSPITAL_BASED_OUTPATIENT_CLINIC_OR_DEPARTMENT_OTHER)
Admission: EM | Admit: 2024-02-27 | Discharge: 2024-02-27 | Disposition: A | Discharge status: Home / Self Care | Attending: Emergency Medicine | Admitting: Emergency Medicine

## 2024-02-27 ENCOUNTER — Emergency Department (HOSPITAL_BASED_OUTPATIENT_CLINIC_OR_DEPARTMENT_OTHER)

## 2024-02-27 DIAGNOSIS — R001 Bradycardia, unspecified: Secondary | ICD-10-CM | POA: Diagnosis not present

## 2024-02-27 DIAGNOSIS — I10 Essential (primary) hypertension: Secondary | ICD-10-CM | POA: Insufficient documentation

## 2024-02-27 DIAGNOSIS — Z7982 Long term (current) use of aspirin: Secondary | ICD-10-CM | POA: Diagnosis not present

## 2024-02-27 DIAGNOSIS — Z79899 Other long term (current) drug therapy: Secondary | ICD-10-CM | POA: Insufficient documentation

## 2024-02-27 DIAGNOSIS — E876 Hypokalemia: Secondary | ICD-10-CM | POA: Diagnosis not present

## 2024-02-27 DIAGNOSIS — R1013 Epigastric pain: Secondary | ICD-10-CM | POA: Diagnosis not present

## 2024-02-27 DIAGNOSIS — R079 Chest pain, unspecified: Secondary | ICD-10-CM | POA: Diagnosis not present

## 2024-02-27 LAB — CBC WITH DIFFERENTIAL/PLATELET
Abs Immature Granulocytes: 0.02 10*3/uL (ref 0.00–0.07)
Basophils Absolute: 0 10*3/uL (ref 0.0–0.1)
Basophils Relative: 1 %
Eosinophils Absolute: 0.3 10*3/uL (ref 0.0–0.5)
Eosinophils Relative: 5 %
HCT: 33.4 % — ABNORMAL LOW (ref 36.0–46.0)
Hemoglobin: 11.1 g/dL — ABNORMAL LOW (ref 12.0–15.0)
Immature Granulocytes: 0 %
Lymphocytes Relative: 45 %
Lymphs Abs: 2.9 10*3/uL (ref 0.7–4.0)
MCH: 31.4 pg (ref 26.0–34.0)
MCHC: 33.2 g/dL (ref 30.0–36.0)
MCV: 94.6 fL (ref 80.0–100.0)
Monocytes Absolute: 0.4 10*3/uL (ref 0.1–1.0)
Monocytes Relative: 6 %
Neutro Abs: 2.8 10*3/uL (ref 1.7–7.7)
Neutrophils Relative %: 43 %
Platelets: 221 10*3/uL (ref 150–400)
RBC: 3.53 MIL/uL — ABNORMAL LOW (ref 3.87–5.11)
RDW: 13.2 % (ref 11.5–15.5)
WBC: 6.5 10*3/uL (ref 4.0–10.5)
nRBC: 0 % (ref 0.0–0.2)

## 2024-02-27 LAB — COMPREHENSIVE METABOLIC PANEL WITH GFR
ALT: 10 U/L (ref 0–44)
AST: 27 U/L (ref 15–41)
Albumin: 4.3 g/dL (ref 3.5–5.0)
Alkaline Phosphatase: 79 U/L (ref 38–126)
Anion gap: 10 (ref 5–15)
BUN: 14 mg/dL (ref 8–23)
CO2: 27 mmol/L (ref 22–32)
Calcium: 9.9 mg/dL (ref 8.9–10.3)
Chloride: 106 mmol/L (ref 98–111)
Creatinine, Ser: 0.83 mg/dL (ref 0.44–1.00)
GFR, Estimated: >60 mL/min (ref >60)
Glucose, Bld: 97 mg/dL (ref 70–99)
Potassium: 3.4 mmol/L — ABNORMAL LOW (ref 3.5–5.1)
Sodium: 143 mmol/L (ref 135–145)
Total Bilirubin: 0.3 mg/dL (ref 0.0–1.2)
Total Protein: 7 g/dL (ref 6.5–8.1)

## 2024-02-27 LAB — LIPASE, BLOOD: Lipase: 21 U/L (ref 11–51)

## 2024-02-27 LAB — TROPONIN T, HIGH SENSITIVITY: Troponin T High Sensitivity: 15 ng/L (ref <19)

## 2024-02-27 MED ORDER — PANTOPRAZOLE SODIUM 20 MG PO TBEC: 20.0000 mg | DELAYED_RELEASE_TABLET | Freq: Every day | ORAL | 0 refills | Status: DC

## 2024-02-27 MED ORDER — ALUM & MAG HYDROXIDE-SIMETH 200-200-20 MG/5ML PO SUSP
15.0000 mL | Freq: Once | ORAL | Status: AC
  Administered 2024-02-27: 15 mL via ORAL
  Filled 2024-02-27: qty 30

## 2024-02-27 MED ORDER — PANTOPRAZOLE SODIUM 40 MG PO TBEC
40.0000 mg | DELAYED_RELEASE_TABLET | Freq: Once | ORAL | Status: AC
  Administered 2024-02-27: 40 mg via ORAL
  Filled 2024-02-27: qty 1

## 2024-02-27 NOTE — Telephone Encounter (Signed)
  Chief Complaint: severe chest pain  Symptoms: radiates to stomach Frequency: 10 days ago but is coming and going more often Pertinent Negatives: Patient denies sweating, radiating arm, neck or back pain Disposition: [x] ED /[] Urgent Care (no appt availability in office) / [] Appointment(In office/virtual)/ []  [facility]/ [] Home Care/ [] Refused Recommended Disposition /[] [facility]/ []  Follow-up with PCP Additional Notes: to ED Copied from CRM [id number]. Topic: Clinical - Red Word Triage >> Feb 27, 2024  2:46 PM Jorie Newness J wrote: Red Word that prompted transfer to Nurse Triage: The pt states she is having a sharp pain fro under her breast going to her stomach Reason for Disposition  SEVERE chest pain  Answer Assessment - Initial Assessment Questions 1. LOCATION: "Where does it hurt?"       Mid chest  2. RADIATION: "Does the pain go anywhere else?" (e.g., into neck, jaw, arms, back)     Stomach  3. ONSET: "When did the chest pain begin?" (Minutes, hours or days)      10 days ago no 4. PATTERN: "Does the pain come and go, or has it been constant since it started?"  "Does it get worse with exertion?"      Comes and goes - 5. DURATION: "How long does it last" (e.g., seconds, minutes, hours)     15-20 minutes 6. SEVERITY: "How bad is the pain?"  (e.g., Scale 1-10; mild, moderate, or severe)    - MILD (1-3): doesn't interfere with normal activities     - MODERATE (4-7): interferes with normal activities or awakens from sleep    - SEVERE (8-10): excruciating pain, unable to do any normal activities       sharp 8. PULMONARY RISK FACTORS: "Do you have any history of lung disease?"  (e.g., blood clots in lung, asthma, emphysema, birth control pills)     none 9. CAUSE: "What do you think is causing the chest pain?"     Unknown  10. OTHER SYMPTOMS: "Do you have any other symptoms?" (e.g., dizziness, nausea, vomiting, sweating, fever, difficulty breathing, cough)        no  Protocols used: Chest Pain-A-AH

## 2024-02-27 NOTE — ED Provider Triage Note (Signed)
 Emergency Medicine Provider Triage Evaluation Note  Lisa Mcconnell , a 68 y.o. female  was evaluated in triage.  Pt complains of chest pain.  Review of Systems  Positive: Chest/epigastric pain x 10 days Negative: Fever, cough, SOB  Physical Exam  BP (!) 168/65   Pulse 60   Temp 98.4 F (36.9 C) (Oral)   Resp 18   SpO2 100%  Gen:   Awake, no distress   Resp:  Normal effort  MSK:   Moves extremities without difficulty  Other:  RRR  Medical Decision Making  Medically screening exam initiated at 3:57 PM.  Appropriate orders placed.  Lisa Mcconnell was informed that the remainder of the evaluation will be completed by another provider, this initial triage assessment does not replace that evaluation, and the importance of remaining in the ED until their evaluation is complete.  10 days of off and on symptoms as above. No modifying factors. Pain is sharp, "I double over when its bad". Usually last about 1 hour then goes away with Tylenol .    Mandy Second, PA-C 02/27/24 1558

## 2024-02-27 NOTE — ED Triage Notes (Signed)
 Patient states intermittent epigastric CP x 10 days. Denies SHOB.

## 2024-02-27 NOTE — Discharge Instructions (Addendum)
 While you were in the emergency room, you have blood work done that was normal.  Your EKG done which looks at the electrical activity of your heart.  This was normal.  Your symptoms could be caused by some irritation of your stomach lining.  I sent in a medication called Protonix  which you can take each day.  I would encourage you to follow-up with your primary care doctor within 1 to 2 weeks to discuss the symptoms.  Return to the emergency room if you develop worsening or persistent symptoms.  If you do not a primary care doctor, included in your discharge paperwork is a telephone number that you can call to establish care with PCP.

## 2024-02-27 NOTE — Telephone Encounter (Signed)
 Noted but no PCP on file for this patient; former [facility] patient

## 2024-02-27 NOTE — ED Provider Notes (Signed)
 [place] EMERGENCY DEPARTMENT AT [facility] Provider Note  CSN: [id number] Arrival date & time: 02/27/24 1538  Chief Complaint(s) Chest Pain  HPI Lisa Mcconnell is a 68 y.o. female who is here today for intermittent episodes of pain in her chest, in her epigastric region.  She says the symptoms seem to come and go over the last 10 days.  She has not had any shortness of breath felt nauseated.  She has not noticed anything that seems to make it feel better or worse.  She is currently not experiencing any symptoms.   Past Medical History Past Medical History:  Diagnosis Date   ACE-inhibitor cough    Anemia    Arthritis    Chronic constipation    Hemorrhoids    Hypertension    Obesity    Patient Active Problem List   Diagnosis Date Noted   Ankle mass, left 01/25/2022   Insulin resistance 10/30/2018   Pure hypercholesterolemia, on Pravastatin  12/29/2017   ASCVD (arteriosclerotic cardiovascular disease) risk 9.5% 12/29/2017   Morbid obesity (HCC) 12/29/2017   Primary osteoarthritis of right knee 10/11/2015   Hypertension, on Hyzaar and Norvasc  10/06/2008   Home Medication(s) Prior to Admission medications   Medication Sig Start Date End Date Taking? Authorizing Provider  pantoprazole  (PROTONIX ) 20 MG tablet Take 1 tablet (20 mg total) by mouth daily. 02/27/24 03/28/24 Yes Afton Horse T, DO  acetaminophen  (TYLENOL ) 500 MG tablet Take 1,000 mg by mouth every 6 (six) hours as needed for moderate pain.    [provider]  amLODipine  (NORVASC ) 5 MG tablet TAKE 1 TABLET BY MOUTH DAILY 07/21/23   Alexander Iba, PA  aspirin  EC 81 MG tablet Take 1 tablet (81 mg total) by mouth daily. 11/21/18   Jonn Nett, DO  gabapentin  (NEURONTIN ) 100 MG capsule Take 1 capsule (100 mg total) by mouth 3 (three) times daily. 06/03/22   Alexander Iba, PA  losartan -hydrochlorothiazide  (HYZAAR) 100-25 MG tablet TAKE 1 TABLET BY MOUTH DAILY 07/21/23   Alexander Iba, PA   predniSONE  (DELTASONE ) 20 MG tablet Take 2 tablets (40 mg total) by mouth daily. 06/03/22   Alexander Iba, PA  tiZANidine  (ZANAFLEX ) 4 MG tablet TAKE 1 TABLET (4 MG TOTAL) BY MOUTH EVERY 8 (EIGHT) HOURS AS NEEDED FOR MUSCLE SPASMS 07/26/22   Syliva Even, MD                                                                                                                                    Past Surgical History Past Surgical History:  Procedure Laterality Date   CESAREAN SECTION     x 3    COLONOSCOPY     left knee replacement  03/2008   TOTAL KNEE ARTHROPLASTY Right 10/12/2015   TOTAL KNEE ARTHROPLASTY Right 10/12/2015   Procedure: TOTAL KNEE ARTHROPLASTY;  Surgeon: Wendolyn Hamburger, MD;  Location: [facility] OR;  Service: Orthopedics;  Laterality: Right;  Family History Family History  Problem Relation Age of Onset   Hypertension Mother    Stroke Mother    Kidney failure Mother    Aneurysm Mother    Alcohol abuse Father    Hypertension Father    Heart attack Father    Heart disease Maternal Grandmother    Heart disease Maternal Grandfather    Heart disease Paternal Grandmother    Heart disease Paternal Grandfather     Social History Social History   Tobacco Use   Smoking status: Never   Smokeless tobacco: Never  Substance Use Topics   Alcohol use: No   Drug use: No   Allergies Benazepril hcl  Review of Systems Review of Systems  Physical Exam Vital Signs  I have reviewed the triage vital signs BP (!) 169/64 (BP Location: Right Arm)   Pulse (!) 51   Temp 98.5 F (36.9 C) (Oral)   Resp 18   SpO2 99%   Physical Exam Vitals and nursing note reviewed.  Constitutional:      General: She is not in acute distress. Cardiovascular:     Rate and Rhythm: Bradycardia present.     Heart sounds: Normal heart sounds.     No friction rub.  Pulmonary:     Effort: Pulmonary effort is normal.     Breath sounds: No decreased breath sounds, wheezing or rhonchi.  Abdominal:      General: There is no abdominal bruit.     Palpations: Abdomen is soft. There is no fluid wave, hepatomegaly, splenomegaly or mass.     Tenderness: There is no abdominal tenderness. There is no guarding or rebound.  Musculoskeletal:     Cervical back: Normal range of motion.  Neurological:     Mental Status: She is alert.     ED Results and Treatments Labs (all labs ordered are listed, but only abnormal results are displayed) Labs Reviewed  CBC WITH DIFFERENTIAL/PLATELET - Abnormal; Notable for the following components:      Result Value   RBC 3.53 (*)    Hemoglobin 11.1 (*)    HCT 33.4 (*)    All other components within normal limits  COMPREHENSIVE METABOLIC PANEL WITH GFR - Abnormal; Notable for the following components:   Potassium 3.4 (*)    All other components within normal limits  LIPASE, BLOOD  TROPONIN T, HIGH SENSITIVITY                                                                                                                          Radiology DG Chest 2 View Result Date: 02/27/2024 CLINICAL DATA:  Chest pain. EXAM: CHEST - 2 VIEW COMPARISON:  Chest radiograph dated 04/14/2017. FINDINGS: No focal consolidation, pleural effusion, or pneumothorax. Stable cardiac silhouette. No acute osseous pathology. IMPRESSION: No active cardiopulmonary disease. Electronically Signed   By: Angus Bark M.D.   On: 02/27/2024 16:21    Pertinent labs & imaging results that were available during my care  of the patient were reviewed by me and considered in my medical decision making (see MDM for details).  Medications Ordered in ED Medications  alum & mag hydroxide-simeth (MAALOX/MYLANTA) 200-200-20 MG/5ML suspension 15 mL (has no administration in time range)  pantoprazole  (PROTONIX ) EC tablet 40 mg (has no administration in time range)                                                                                                                                      Procedures Procedures  (including critical care time)  Medical Decision Making / ED Course   This patient presents to the ED for concern of intermittent abdominal pain, this involves an extensive number of treatment options, and is a complaint that carries with it a high risk of complications and morbidity.  The differential diagnosis includes biliary colic, peptic ulcer disease, gastritis, pancreatitis, ACS.  MDM: On exam, patient overall well-appearing.  Very pleasant, talkative.  Patient overall healthy, is currently in process of trying to lose weight.  Says that she is trying to run a calorie deficit, she has noticed these pains seem to be occurring more since she has begun that.  Since being in the ED, she has not had any symptoms.  Patient was concerned that there could be something going on with her heart.  Patient is a soft abdomen.  Was not able to elicit any tenderness.  She has normal heart sounds.  Her EKG is nonischemic.  My independent review of the patient's EKG shows no ST segment depressions or elevations, no T wave inversions, no evidence of acute ischemia.  Her initial troponin is 15.  Based on the patient's history exam, I think that most likely this is a GI issue rather than cardiac.  Certainly could be peptic ulcer disease versus biliary colic.  Had planned to order ultrasound on the patient, or the patient tells me that she has to leave the emergency room by 9.  Is unlikely the patient's workup would be to be to be fully completed.  Discussed with the patient, she preferred to try symptomatic management, and assures me she will return to the ED if her symptoms persist or worsen.   Additional history obtained: -Additional history obtained from  -External records from outside source obtained and reviewed including: Chart review including previous notes, labs, imaging, consultation notes   Lab Tests: -I ordered, reviewed, and interpreted labs.   The pertinent  results include:   Labs Reviewed  CBC WITH DIFFERENTIAL/PLATELET - Abnormal; Notable for the following components:      Result Value   RBC 3.53 (*)    Hemoglobin 11.1 (*)    HCT 33.4 (*)    All other components within normal limits  COMPREHENSIVE METABOLIC PANEL WITH GFR - Abnormal; Notable for the following components:   Potassium 3.4 (*)    All other components within normal limits  LIPASE, BLOOD  TROPONIN T, HIGH SENSITIVITY      EKG my independent review of the patient's EKG shows no ST segment depressions or elevations, no T wave inversions, no evidence of acute ischemia.  EKG Interpretation Date/Time:  Tuesday February 27 2024 16:04:05 EDT Ventricular Rate:  56 PR Interval:  136 QRS Duration:  82 QT Interval:  438 QTC Calculation: 422 R Axis:   18  Text Interpretation: Sinus bradycardia Otherwise normal ECG When compared with ECG of 06-Oct-2015 09:02, No significant change was found Confirmed by Afton Horse [phone]) on 02/27/2024 8:10:05 PM         Imaging Studies ordered: I ordered imaging studies including chest x-ray I independently visualized and interpreted imaging. I agree with the radiologist interpretation   Medicines ordered and prescription drug management: Meds ordered this encounter  Medications   alum & mag hydroxide-simeth (MAALOX/MYLANTA) 200-200-20 MG/5ML suspension 15 mL   pantoprazole  (PROTONIX ) EC tablet 40 mg   pantoprazole  (PROTONIX ) 20 MG tablet    Sig: Take 1 tablet (20 mg total) by mouth daily.    Dispense:  30 tablet    Refill:  0    -I have reviewed the patients home medicines and have made adjustments as needed   Cardiac Monitoring: The patient was maintained on a cardiac monitor.  I personally viewed and interpreted the cardiac monitored which showed an underlying rhythm of: Sinus bradycardia  Social Determinants of Health:  Factors impacting patients care include:    Reevaluation: After the interventions noted above, I  reevaluated the patient and found that they have :improved  Co morbidities that complicate the patient evaluation  Past Medical History:  Diagnosis Date   ACE-inhibitor cough    Anemia    Arthritis    Chronic constipation    Hemorrhoids    Hypertension    Obesity       Dispostion: I considered admission for this patient, however the patient has an overall benign exam and workup thus far, wishes to return home.  Will discharge with close follow-up.     Final Clinical Impression(s) / ED Diagnoses Final diagnoses:  Epigastric pain     @PCDICTATION @    Afton Horse T, DO 02/27/24 2015

## 2024-03-04 ENCOUNTER — Inpatient Hospital Stay: Admitting: Physician Assistant

## 2024-03-11 ENCOUNTER — Ambulatory Visit (INDEPENDENT_AMBULATORY_CARE_PROVIDER_SITE_OTHER): Admitting: Physician Assistant

## 2024-03-11 ENCOUNTER — Encounter: Payer: Self-pay | Admitting: Physician Assistant

## 2024-03-11 VITALS — BP 120/60 | HR 67 | Temp 97.2°F | Ht 67.0 in | Wt 254.2 lb

## 2024-03-11 DIAGNOSIS — D649 Anemia, unspecified: Secondary | ICD-10-CM | POA: Diagnosis not present

## 2024-03-11 DIAGNOSIS — I10 Essential (primary) hypertension: Secondary | ICD-10-CM | POA: Diagnosis not present

## 2024-03-11 DIAGNOSIS — Z1211 Encounter for screening for malignant neoplasm of colon: Secondary | ICD-10-CM

## 2024-03-11 DIAGNOSIS — E88819 Insulin resistance, unspecified: Secondary | ICD-10-CM

## 2024-03-11 DIAGNOSIS — M7989 Other specified soft tissue disorders: Secondary | ICD-10-CM | POA: Diagnosis not present

## 2024-03-11 DIAGNOSIS — E7800 Pure hypercholesterolemia, unspecified: Secondary | ICD-10-CM

## 2024-03-11 DIAGNOSIS — R1013 Epigastric pain: Secondary | ICD-10-CM

## 2024-03-11 LAB — COMPREHENSIVE METABOLIC PANEL WITH GFR
ALT: 8 U/L (ref 0–35)
AST: 17 U/L (ref 0–37)
Albumin: 4 g/dL (ref 3.5–5.2)
Alkaline Phosphatase: 67 U/L (ref 39–117)
BUN: 16 mg/dL (ref 6–23)
CO2: 28 meq/L (ref 19–32)
Calcium: 9.6 mg/dL (ref 8.4–10.5)
Chloride: 105 meq/L (ref 96–112)
Creatinine, Ser: 0.81 mg/dL (ref 0.40–1.20)
GFR: 75.16 mL/min (ref >60.00)
Glucose, Bld: 108 mg/dL — ABNORMAL HIGH (ref 70–99)
Potassium: 3 meq/L — ABNORMAL LOW (ref 3.5–5.1)
Sodium: 141 meq/L (ref 135–145)
Total Bilirubin: 0.5 mg/dL (ref 0.2–1.2)
Total Protein: 7.3 g/dL (ref 6.0–8.3)

## 2024-03-11 LAB — IBC + FERRITIN
Ferritin: 151.1 ng/mL (ref 10.0–291.0)
Iron: 73 ug/dL (ref 42–145)
Saturation Ratios: 24 % (ref 20.0–50.0)
TIBC: 303.8 ug/dL (ref 250.0–450.0)
Transferrin: 217 mg/dL (ref 212.0–360.0)

## 2024-03-11 LAB — CBC WITH DIFFERENTIAL/PLATELET
Basophils Absolute: 0 10*3/uL (ref 0.0–0.1)
Basophils Relative: 0.6 % (ref 0.0–3.0)
Eosinophils Absolute: 0.3 10*3/uL (ref 0.0–0.7)
Eosinophils Relative: 4.6 % (ref 0.0–5.0)
HCT: 36.1 % (ref 36.0–46.0)
Hemoglobin: 12.1 g/dL (ref 12.0–15.0)
Lymphocytes Relative: 41.1 % (ref 12.0–46.0)
Lymphs Abs: 2.3 10*3/uL (ref 0.7–4.0)
MCHC: 33.4 g/dL (ref 30.0–36.0)
MCV: 96.4 fl (ref 78.0–100.0)
Monocytes Absolute: 0.3 10*3/uL (ref 0.1–1.0)
Monocytes Relative: 6 % (ref 3.0–12.0)
Neutro Abs: 2.6 10*3/uL (ref 1.4–7.7)
Neutrophils Relative %: 47.7 % (ref 43.0–77.0)
Platelets: 223 10*3/uL (ref 150.0–400.0)
RBC: 3.74 Mil/uL — ABNORMAL LOW (ref 3.87–5.11)
RDW: 13.5 % (ref 11.5–15.5)
WBC: 5.5 10*3/uL (ref 4.0–10.5)

## 2024-03-11 LAB — LIPID PANEL
Cholesterol: 188 mg/dL (ref 0–200)
HDL: 61.9 mg/dL (ref >39.00)
LDL Cholesterol: 112 mg/dL — ABNORMAL HIGH (ref 0–99)
NonHDL: 126.45
Total CHOL/HDL Ratio: 3
Triglycerides: 71 mg/dL (ref 0.0–149.0)
VLDL: 14.2 mg/dL (ref 0.0–40.0)

## 2024-03-11 LAB — HEMOGLOBIN A1C: Hgb A1c MFr Bld: 6 % (ref 4.6–6.5)

## 2024-03-11 MED ORDER — PRAVASTATIN SODIUM 20 MG PO TABS: 20.0000 mg | ORAL_TABLET | Freq: Every day | ORAL | 1 refills | Status: DC

## 2024-03-11 MED ORDER — PANTOPRAZOLE SODIUM 20 MG PO TBEC
20.0000 mg | DELAYED_RELEASE_TABLET | Freq: Every day | ORAL | 1 refills | Status: DC
Start: 2024-03-11 — End: 2024-06-12

## 2024-03-11 NOTE — Progress Notes (Signed)
 Lisa Mcconnell is a 68 y.o. female here for a follow up of a pre-existing problem.  History of Present Illness:   Chief Complaint  Patient presents with   Follow-up    Pt would like to review labs done in the ED on 4/22. Pain in epigastric region has subsided.    HPI  ED f/u: Pt presented to the ED at [facility] with intermittent chest pain in the epigastric region on 02/27/24.  Pain first started in the upper middle of her chest, radiating downward to the center of her chest over the rest of the week. She notes she has been trying to lose weight by consuming less than 1200 calories a day and eating a healthier diet. Pt was prescribed Pantoprazole  20 mg once daily by the ED, which she reports good compliance and tolerance. At the ED, blood work, ultrasound, and EKG were performed. Ultrasound showed some gas in stomach, but was otherwise normal. Blood work showed small decrease in Hgb. Today, pt reports no pain. Pt has hx of anemia when she was younger and hypertension. She reports good compliance and tolerance of her antihypertensives. Pt is agreeable to updating her colonoscopy and rechecking blood work. Pt is overdue for mammogram, but is not agreeable to rechecking.  Bilateral ankle swelling // HTN  Pt complains of swelling in both ankles.  Pt is on Losartan -HTCZ 100-25 mg tablet once daily, which she has noted that she has taken twice at least 3 times over the past 6 months to see if it would help with her swelling  Denies shortness of breath, chest pain    Past Medical History:  Diagnosis Date   ACE-inhibitor cough    Anemia    Arthritis    Chronic constipation    Hemorrhoids    Hypertension    Obesity      Social History   Tobacco Use   Smoking status: Never   Smokeless tobacco: Never  Substance Use Topics   Alcohol use: No   Drug use: No    Past Surgical History:  Procedure Laterality Date   CESAREAN SECTION     x 3    COLONOSCOPY     left knee  replacement  03/2008   TOTAL KNEE ARTHROPLASTY Right 10/12/2015   TOTAL KNEE ARTHROPLASTY Right 10/12/2015   Procedure: TOTAL KNEE ARTHROPLASTY;  Surgeon: Wendolyn Hamburger, MD;  Location: [facility] OR;  Service: Orthopedics;  Laterality: Right;    Family History  Problem Relation Age of Onset   Hypertension Mother    Stroke Mother    Kidney failure Mother    Aneurysm Mother    Alcohol abuse Father    Hypertension Father    Heart attack Father    Heart disease Maternal Grandmother    Heart disease Maternal Grandfather    Heart disease Paternal Grandmother    Heart disease Paternal Grandfather     Allergies  Allergen Reactions   Benazepril Hcl Cough    Current Medications:   Current Outpatient Medications:    acetaminophen  (TYLENOL ) 500 MG tablet, Take 1,000 mg by mouth every 6 (six) hours as needed for moderate pain., Disp: , Rfl:    amLODipine  (NORVASC ) 5 MG tablet, TAKE 1 TABLET BY MOUTH DAILY, Disp: 100 tablet, Rfl: 2   aspirin  EC 81 MG tablet, Take 1 tablet (81 mg total) by mouth daily., Disp: 90 tablet, Rfl: 1   gabapentin  (NEURONTIN ) 100 MG capsule, Take 1 capsule (100 mg total) by mouth 3 (three) times daily.,  Disp: 60 capsule, Rfl: 1   losartan -hydrochlorothiazide  (HYZAAR) 100-25 MG tablet, TAKE 1 TABLET BY MOUTH DAILY, Disp: 100 tablet, Rfl: 2   pravastatin  (PRAVACHOL ) 20 MG tablet, Take 1 tablet (20 mg total) by mouth daily., Disp: 90 tablet, Rfl: 1   pantoprazole  (PROTONIX ) 20 MG tablet, Take 1 tablet (20 mg total) by mouth daily., Disp: 90 tablet, Rfl: 1   Review of Systems:   Negative unless otherwise specified per HPI.  Vitals:   Vitals:   03/11/24 1348  BP: 120/60  Pulse: 67  Temp: (!) 97.2 F (36.2 C)  TempSrc: Temporal  SpO2: 98%  Weight: 254 lb 4 oz (115.3 kg)  Height: 5\' 7"  (1.702 m)     Body mass index is 39.82 kg/m.  Physical Exam:   Physical Exam Vitals and nursing note reviewed.  Constitutional:      General: She is not in acute distress.     Appearance: She is well-developed. She is not ill-appearing or toxic-appearing.  Cardiovascular:     Rate and Rhythm: Normal rate and regular rhythm.     Pulses: Normal pulses.     Heart sounds: Normal heart sounds, S1 normal and S2 normal.  Pulmonary:     Effort: Pulmonary effort is normal.     Breath sounds: Normal breath sounds.  Musculoskeletal:     Right lower leg: No edema.     Left lower leg: No edema.  Skin:    General: Skin is warm and dry.  Neurological:     Mental Status: She is alert.     GCS: GCS eye subscore is 4. GCS verbal subscore is 5. GCS motor subscore is 6.  Psychiatric:        Speech: Speech normal.        Behavior: Behavior normal. Behavior is cooperative.     Assessment and Plan:   Primary hypertension Normotensive Continue amlodipine  5 mg daily and losartan  hydrochlorothiazide  100-25 mg daily Follow-up in 1 month for CPE, sooner if concerns  Insulin resistance Update A1c and provide further recommendations  Pure hypercholesterolemia Agreeable to restarting pravastatin  20 mg daily Update blood work and restart this medication  Low hemoglobin Unclear etiology but she is overdue for her colonoscopy We will recheck CBC and add iron panel Refer for colonoscopy  Special screening for malignant neoplasms, colon Referral placed  Epigastric pain Overall improved with Protonix , refilled today  Leg swelling No swelling on my exam Update blood work Consider stopping amlodipine  5 mg daily to see if this improves symptoms Recommend compression socks and elevation of legs   I, Timoteo Force, acting as a Neurosurgeon for [employer], [place]., have documented all relevant documentation on the behalf of Alexander Iba, [place], as directed by  Alexander Iba, PA while in the presence of Alexander Iba, [place].  I, Alexander Iba, [place], have reviewed all documentation for this visit. The documentation on 03/11/24 for the exam, diagnosis, procedures, and orders are all  accurate and complete.  Alexander Iba, PA-C

## 2024-03-11 NOTE — Patient Instructions (Signed)
 It was great to see you!  Restart pravastatin  20 mg daily  We will update blood work today  Consider compression stockings to help with swelling  I will order colonoscopy for you  Let's follow-up in 3 months, sooner if you have concerns.  Take care,  Alexander Iba PA-C

## 2024-03-12 ENCOUNTER — Other Ambulatory Visit: Payer: Self-pay | Admitting: *Deleted

## 2024-03-12 ENCOUNTER — Other Ambulatory Visit: Payer: Self-pay | Admitting: Physician Assistant

## 2024-03-12 ENCOUNTER — Telehealth: Payer: Self-pay | Admitting: *Deleted

## 2024-03-12 MED ORDER — PRAVASTATIN SODIUM 40 MG PO TABS: 40.0000 mg | ORAL_TABLET | Freq: Every day | ORAL | 3 refills | Status: DC

## 2024-03-12 NOTE — Telephone Encounter (Signed)
 Lisa Mcconnell, pt wants to know if you will prescribe pain medication for her low back pain? Pt said she has tried Tylenol  and Aleve  with no relief, also taking Ibuprofen  and wants to know if okay to take Ibuprofen . Please advise.

## 2024-03-13 NOTE — Telephone Encounter (Signed)
 Pt called requesting why they needed another appointment. Explained to pt the reasoning. Pt understood and is keeping appointment with BF

## 2024-03-13 NOTE — Telephone Encounter (Signed)
 Noted.

## 2024-03-13 NOTE — Telephone Encounter (Signed)
 Left message on voicemail to call office.

## 2024-04-03 ENCOUNTER — Ambulatory Visit (INDEPENDENT_AMBULATORY_CARE_PROVIDER_SITE_OTHER): Admitting: Internal Medicine

## 2024-04-03 ENCOUNTER — Encounter: Payer: Self-pay | Admitting: Internal Medicine

## 2024-04-03 VITALS — BP 130/70 | HR 59 | Temp 97.5°F | Ht 67.0 in | Wt 250.0 lb

## 2024-04-03 DIAGNOSIS — Z13820 Encounter for screening for osteoporosis: Secondary | ICD-10-CM | POA: Diagnosis not present

## 2024-04-03 DIAGNOSIS — R7302 Impaired glucose tolerance (oral): Secondary | ICD-10-CM | POA: Diagnosis not present

## 2024-04-03 DIAGNOSIS — E7800 Pure hypercholesterolemia, unspecified: Secondary | ICD-10-CM

## 2024-04-03 DIAGNOSIS — I10 Essential (primary) hypertension: Secondary | ICD-10-CM | POA: Diagnosis not present

## 2024-04-03 DIAGNOSIS — M545 Low back pain, unspecified: Secondary | ICD-10-CM | POA: Diagnosis not present

## 2024-04-03 DIAGNOSIS — Z124 Encounter for screening for malignant neoplasm of cervix: Secondary | ICD-10-CM | POA: Diagnosis not present

## 2024-04-03 DIAGNOSIS — Z1211 Encounter for screening for malignant neoplasm of colon: Secondary | ICD-10-CM | POA: Diagnosis not present

## 2024-04-03 DIAGNOSIS — Z1231 Encounter for screening mammogram for malignant neoplasm of breast: Secondary | ICD-10-CM

## 2024-04-03 MED ORDER — MELOXICAM 7.5 MG PO TABS
7.5000 mg | ORAL_TABLET | Freq: Every day | ORAL | 0 refills | Status: DC
Start: 2024-04-03 — End: 2024-05-15

## 2024-04-03 NOTE — Progress Notes (Signed)
 Established Patient Office Visit     CC/Reason for Visit: Establish care, discuss chronic medical conditions  HPI: Lisa Mcconnell is a 68 y.o. female who is coming in today for the above mentioned reasons. Past Medical History is significant for: Hypertension, hyperlipidemia, morbid obesity.  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  She does not smoke, drinks occasionally, has allergies to ACE inhibitors which cause a cough, her past surgical history significant for 3 C-sections and bilateral total knee replacements.  Her family history is significant for hypertension and a stroke in her mother and her father had hypertension and an MI.  She is due for pneumonia and shingles vaccines.  She was discontinued off amlodipine  earlier this month due to lower extremity edema which has resolved.  Blood pressure remains stable.  She is overdue for all age-appropriate cancer screening.  2 weeks ago she suffered a fall landing on her back.  Pain has slowly improved but is still very tender at nighttime.   Past Medical/Surgical History: Past Medical History:  Diagnosis Date   ACE-inhibitor cough    Anemia    Arthritis    Chronic constipation    Hemorrhoids    Hypertension    Obesity     Past Surgical History:  Procedure Laterality Date   CESAREAN SECTION     x 3    COLONOSCOPY     left knee replacement  03/2008   TOTAL KNEE ARTHROPLASTY Right 10/12/2015   TOTAL KNEE ARTHROPLASTY Right 10/12/2015   Procedure: TOTAL KNEE ARTHROPLASTY;  Surgeon: Wendolyn Hamburger, MD;  Location: [facility] OR;  Service: Orthopedics;  Laterality: Right;    Social History:  reports that she has never smoked. She has never used smokeless tobacco. She reports that she does not drink alcohol and does not use drugs.  Allergies: Allergies  Allergen Reactions   Benazepril Hcl Cough    Family History:  Family History  Problem Relation Age of Onset   Hypertension  Mother    Stroke Mother    Kidney failure Mother    Aneurysm Mother    Alcohol abuse Father    Hypertension Father    Heart attack Father    Heart disease Maternal Grandmother    Heart disease Maternal Grandfather    Heart disease Paternal Grandmother    Heart disease Paternal Grandfather      Current Outpatient Medications:    acetaminophen  (TYLENOL ) 500 MG tablet, Take 1,000 mg by mouth every 6 (six) hours as needed for moderate pain., Disp: , Rfl:    aspirin  EC 81 MG tablet, Take 1 tablet (81 mg total) by mouth daily., Disp: 90 tablet, Rfl: 1   gabapentin  (NEURONTIN ) 100 MG capsule, Take 1 capsule (100 mg total) by mouth 3 (three) times daily., Disp: 60 capsule, Rfl: 1   losartan -hydrochlorothiazide  (HYZAAR) 100-25 MG tablet, TAKE 1 TABLET BY MOUTH DAILY, Disp: 100 tablet, Rfl: 2   meloxicam  (MOBIC ) 7.5 MG tablet, Take 1 tablet (7.5 mg total) by mouth daily., Disp: 10 tablet, Rfl: 0   pantoprazole  (PROTONIX ) 20 MG tablet, Take 1 tablet (20 mg total) by mouth daily., Disp: 90 tablet, Rfl: 1   pravastatin  (PRAVACHOL ) 40 MG tablet, Take 1 tablet (40 mg total) by mouth daily., Disp: 90 tablet, Rfl: 3   amLODipine  (NORVASC ) 5 MG tablet, TAKE 1 TABLET BY MOUTH DAILY (Patient not taking: Reported on 04/03/2024), Disp: 100 tablet, Rfl: 2  Review of Systems:  Negative unless indicated in HPI.   Physical Exam: Vitals:   04/03/24 1254  BP: 130/70  Pulse: (!) 59  Temp: (!) 97.5 F (36.4 C)  TempSrc: Oral  SpO2: 98%  Weight: 250 lb (113.4 kg)  Height: 5\' 7"  (1.702 m)    Body mass index is 39.16 kg/m.   Physical Exam   Impression and Plan:  Screening for osteoporosis -     DG Bone Density; Future  Encounter for screening mammogram for malignant neoplasm of breast -     Digital Screening Mammogram, Left and Right; Future  Screening for malignant neoplasm of colon  Screening for cervical cancer -     Ambulatory referral to Gynecology  Primary hypertension  Pure  hypercholesterolemia, on Pravastatin   Morbid obesity (HCC)  IGT (impaired glucose tolerance)  Acute midline low back pain without sciatica -     Meloxicam ; Take 1 tablet (7.5 mg total) by mouth daily.  Dispense: 10 tablet; Refill: 0   - Meloxicam  for lower back pain for 10 days. - Blood pressure is well-controlled, okay to stay off amlodipine  for now. - Recent lipids acceptable on pravastatin . - Recent A1c is 6.0 which makes her prediabetic.  We have discussed lifestyle changes. - Appropriate referrals for cancer screenings and bone density have been placed.  Time spent:32 minutes reviewing chart, interviewing and examining patient and formulating plan of care.     Marguerita Shih, MD [facility]

## 2024-04-05 ENCOUNTER — Other Ambulatory Visit: Payer: Self-pay | Admitting: Physician Assistant

## 2024-04-05 DIAGNOSIS — I10 Essential (primary) hypertension: Secondary | ICD-10-CM

## 2024-04-25 ENCOUNTER — Encounter: Payer: Self-pay | Admitting: Physician Assistant

## 2024-05-01 ENCOUNTER — Ambulatory Visit
Admission: RE | Admit: 2024-05-01 | Discharge: 2024-05-01 | Disposition: A | Discharge status: Home / Self Care | Source: Ambulatory Visit | Attending: Internal Medicine | Admitting: Internal Medicine

## 2024-05-01 DIAGNOSIS — Z1231 Encounter for screening mammogram for malignant neoplasm of breast: Secondary | ICD-10-CM | POA: Diagnosis not present

## 2024-05-06 ENCOUNTER — Other Ambulatory Visit: Payer: Self-pay | Admitting: Internal Medicine

## 2024-05-06 DIAGNOSIS — R928 Other abnormal and inconclusive findings on diagnostic imaging of breast: Secondary | ICD-10-CM

## 2024-05-13 ENCOUNTER — Other Ambulatory Visit: Payer: Self-pay | Admitting: Physician Assistant

## 2024-05-13 ENCOUNTER — Encounter: Admitting: Internal Medicine

## 2024-05-13 DIAGNOSIS — E7800 Pure hypercholesterolemia, unspecified: Secondary | ICD-10-CM

## 2024-05-13 DIAGNOSIS — D649 Anemia, unspecified: Secondary | ICD-10-CM

## 2024-05-13 DIAGNOSIS — I10 Essential (primary) hypertension: Secondary | ICD-10-CM

## 2024-05-13 DIAGNOSIS — R7302 Impaired glucose tolerance (oral): Secondary | ICD-10-CM

## 2024-05-15 ENCOUNTER — Encounter: Payer: Self-pay | Admitting: Internal Medicine

## 2024-05-15 ENCOUNTER — Ambulatory Visit (INDEPENDENT_AMBULATORY_CARE_PROVIDER_SITE_OTHER): Admitting: Internal Medicine

## 2024-05-15 VITALS — BP 130/68 | HR 64 | Temp 97.5°F | Ht 67.0 in | Wt 249.2 lb

## 2024-05-15 DIAGNOSIS — Z23 Encounter for immunization: Secondary | ICD-10-CM

## 2024-05-15 DIAGNOSIS — R7302 Impaired glucose tolerance (oral): Secondary | ICD-10-CM | POA: Diagnosis not present

## 2024-05-15 DIAGNOSIS — I10 Essential (primary) hypertension: Secondary | ICD-10-CM

## 2024-05-15 DIAGNOSIS — Z1211 Encounter for screening for malignant neoplasm of colon: Secondary | ICD-10-CM

## 2024-05-15 DIAGNOSIS — M1711 Unilateral primary osteoarthritis, right knee: Secondary | ICD-10-CM | POA: Diagnosis not present

## 2024-05-15 DIAGNOSIS — H539 Unspecified visual disturbance: Secondary | ICD-10-CM | POA: Diagnosis not present

## 2024-05-15 DIAGNOSIS — E7800 Pure hypercholesterolemia, unspecified: Secondary | ICD-10-CM | POA: Diagnosis not present

## 2024-05-15 DIAGNOSIS — Z0000 Encounter for general adult medical examination without abnormal findings: Secondary | ICD-10-CM

## 2024-05-15 DIAGNOSIS — Z13820 Encounter for screening for osteoporosis: Secondary | ICD-10-CM

## 2024-05-15 DIAGNOSIS — D649 Anemia, unspecified: Secondary | ICD-10-CM

## 2024-05-15 DIAGNOSIS — M545 Low back pain, unspecified: Secondary | ICD-10-CM | POA: Diagnosis not present

## 2024-05-15 MED ORDER — MELOXICAM 7.5 MG PO TABS: 7.5000 mg | ORAL_TABLET | Freq: Every day | ORAL | 0 refills | Status: AC

## 2024-05-15 NOTE — Progress Notes (Signed)
 Established Patient Office Visit     CC/Reason for Visit: Annual preventive exam and subsequent Medicare wellness visit  HPI: Lisa Mcconnell is a 68 y.o. female who is coming in today for the above mentioned reasons. Past Medical History is significant for: Hypertension, hyperlipidemia, morbid obesity.  Feeling well without acute concerns or complaints.  Is overdue for an eye exam, has routine dental care.  Is due for Tdap, shingles and pneumonia vaccines.  Is due for all age-appropriate cancer screening with the exception of her mammogram which was normal a few months ago.   Past Medical/Surgical History: Past Medical History:  Diagnosis Date   ACE-inhibitor cough    Anemia    Arthritis    Chronic constipation    Hemorrhoids    Hypertension    Obesity     Past Surgical History:  Procedure Laterality Date   CESAREAN SECTION     x 3    COLONOSCOPY     left knee replacement  03/2008   TOTAL KNEE ARTHROPLASTY Right 10/12/2015   TOTAL KNEE ARTHROPLASTY Right 10/12/2015   Procedure: TOTAL KNEE ARTHROPLASTY;  Surgeon: Dempsey Sensor, MD;  Location: [facility];  Service: Orthopedics;  Laterality: Right;    Social History:  reports that she has never smoked. She has never used smokeless tobacco. She reports that she does not drink alcohol and does not use drugs.  Allergies: Allergies  Allergen Reactions   Benazepril Hcl Cough    Family History:  Family History  Problem Relation Age of Onset   Hypertension Mother    Stroke Mother    Kidney failure Mother    Aneurysm Mother    Alcohol abuse Father    Hypertension Father    Heart attack Father    Heart disease Maternal Grandmother    Heart disease Maternal Grandfather    Heart disease Paternal Grandmother    Heart disease Paternal Grandfather      Current Outpatient Medications:    acetaminophen  (TYLENOL ) 500 MG tablet, Take 1,000 mg by mouth every 6 (six) hours as needed for moderate pain., Disp: , Rfl:    aspirin   EC 81 MG tablet, Take 1 tablet (81 mg total) by mouth daily., Disp: 90 tablet, Rfl: 1   losartan -hydrochlorothiazide  (HYZAAR) 100-25 MG tablet, TAKE 1 TABLET BY MOUTH DAILY, Disp: 100 tablet, Rfl: 2   pravastatin  (PRAVACHOL ) 40 MG tablet, Take 1 tablet (40 mg total) by mouth daily., Disp: 90 tablet, Rfl: 3   amLODipine  (NORVASC ) 5 MG tablet, TAKE 1 TABLET BY MOUTH DAILY (Patient not taking: Reported on 05/15/2024), Disp: 100 tablet, Rfl: 2   gabapentin  (NEURONTIN ) 100 MG capsule, Take 1 capsule (100 mg total) by mouth 3 (three) times daily. (Patient not taking: Reported on 05/15/2024), Disp: 60 capsule, Rfl: 1   meloxicam  (MOBIC ) 7.5 MG tablet, Take 1 tablet (7.5 mg total) by mouth daily., Disp: 10 tablet, Rfl: 0   pantoprazole  (PROTONIX ) 20 MG tablet, Take 1 tablet (20 mg total) by mouth daily. (Patient not taking: Reported on 05/15/2024), Disp: 90 tablet, Rfl: 1  Review of Systems:  Negative unless indicated in HPI.   Physical Exam: Vitals:   05/15/24 1447  BP: 130/68  Pulse: 64  Temp: (!) 97.5 F (36.4 C)  SpO2: 98%  Weight: 249 lb 3.2 oz (113 kg)  Height: 5' 7 (1.702 m)    Body mass index is 39.03 kg/m.   Physical Exam Vitals reviewed.  Constitutional:      General:  She is not in acute distress.    Appearance: Normal appearance. She is obese. She is not ill-appearing, toxic-appearing or diaphoretic.  HENT:     Head: Normocephalic.     Right Ear: Tympanic membrane, ear canal and external ear normal. There is no impacted cerumen.     Left Ear: Tympanic membrane, ear canal and external ear normal. There is no impacted cerumen.     Nose: Nose normal.     Mouth/Throat:     Mouth: Mucous membranes are moist.     Pharynx: Oropharynx is clear. No oropharyngeal exudate or posterior oropharyngeal erythema.  Eyes:     General: No scleral icterus.       Right eye: No discharge.        Left eye: No discharge.     Conjunctiva/sclera: Conjunctivae normal.     Pupils: Pupils are equal,  round, and reactive to light.  Neck:     Vascular: No carotid bruit.  Cardiovascular:     Rate and Rhythm: Normal rate and regular rhythm.     Pulses: Normal pulses.     Heart sounds: Normal heart sounds.  Pulmonary:     Effort: Pulmonary effort is normal. No respiratory distress.     Breath sounds: Normal breath sounds.  Abdominal:     General: Abdomen is flat. Bowel sounds are normal.     Palpations: Abdomen is soft.  Musculoskeletal:        General: Normal range of motion.     Cervical back: Normal range of motion.  Skin:    General: Skin is warm and dry.  Neurological:     General: No focal deficit present.     Mental Status: She is alert and oriented to person, place, and time. Mental status is at baseline.  Psychiatric:        Mood and Affect: Mood normal.        Behavior: Behavior normal.        Thought Content: Thought content normal.        Judgment: Judgment normal.    Subsequent Medicare wellness visit   1. Risk factors, based on past  M,S,F - Cardiac Risk Factors include: advanced age (>77men, >37 women);diabetes mellitus;hypertension;dyslipidemia   2.  Physical activities: Dietary issues and exercise activities discussed:      3.  Depression/mood:     4.  ADL's:    05/15/2024    2:53 PM  In your present state of health, do you have any difficulty performing the following activities:  Hearing? 0  Vision? 0  Difficulty concentrating or making decisions? 0  Walking or climbing stairs? 0  Dressing or bathing? 0  Doing errands, shopping? 0  Preparing Food and eating ? N  Using the Toilet? Y  In the past six months, have you accidently leaked urine? N  Do you have problems with loss of bowel control? N  Managing your Medications? N  Managing your Finances? N  Housekeeping or managing your Housekeeping? N     5.  Fall risk:     03/30/2021    2:55 PM 03/22/2022   11:51 AM 03/11/2024    1:48 PM 04/03/2024   12:53 PM 05/15/2024    2:56 PM  Fall Risk  Falls  in the past year? 0 0 0 1 1  Was there an injury with Fall?  0 0 0 1  Fall Risk Category Calculator  0 0 1 3  Fall Risk Category (Retired)  Low      (  RETIRED) Patient Fall Risk Level  Low fall risk      Patient at Risk for Falls Due to No Fall Risks No Fall Risks     Fall risk Follow up   Falls evaluation completed Falls evaluation completed Falls evaluation completed     Data saved with a previous flowsheet row definition     6.  Home safety: No problems identified   7.  Height weight, and visual acuity: height and weight as above, vision/hearing: Vision Screening   Right eye Left eye Both eyes  Without correction 20/70 20/70 20/70   With correction        8.  Counseling: Counseling given: Not Answered    9. Lab orders based on risk factors: Laboratory update will be reviewed   10. Cognitive assessment:        05/15/2024    2:57 PM  6CIT Screen  What Year? 0 points  What month? 0 points  What time? 0 points  Count back from 20 0 points  Months in reverse 2 points  Repeat phrase 0 points  Total Score 2 points     11. Screening: Patient provided with a written and personalized 5-10 year screening schedule in the AVS. Health Maintenance  Topic Date Due   Colon Cancer Screening  09/19/2018   DEXA scan (bone density measurement)  Never done   Pneumococcal Vaccine for age over 22 (1 of 1 - PCV) 03/11/2025*   COVID-19 Vaccine (3 - 2024-25 season) 03/11/2025*   Medicare Annual Wellness Visit  05/15/2025   Mammogram  05/01/2026   DTaP/Tdap/Td vaccine (3 - Td or Tdap) 11/21/2028   Hepatitis C Screening  Completed   Hepatitis B Vaccine  Aged Out   HPV Vaccine  Aged Out   Meningitis B Vaccine  Aged Out   Flu Shot  Discontinued   Zoster (Shingles) Vaccine  Discontinued  *Topic was postponed. The date shown is not the original due date.    12. Provider List Update: Patient Care Team    Relationship Specialty Notifications Start End  Theophilus Andrews, Tully GRADE, MD PCP -  General Internal Medicine  04/03/24      13. Advance Directives: Does Patient Have a Medical Advance Directive?: No Would patient like information on creating a medical advance directive?: No - Patient declined  14. Opioids: Patient is not on any opioid prescriptions and has no risk factors for a substance use disorder.   15.   Goals      Weight (lb) < 200 lb (90.7 kg)         I have personally reviewed and noted the following in the patient's chart:   Medical and social history Use of alcohol, tobacco or illicit drugs  Current medications and supplements Functional ability and status Nutritional status Physical activity Advanced directives List of other physicians Hospitalizations, surgeries, and ER visits in previous 12 months Vitals Screenings to include cognitive, depression, and falls Referrals and appointments  In addition, I have reviewed and discussed with patient certain preventive protocols, quality metrics, and best practice recommendations. A written personalized care plan for preventive services as well as general preventive health recommendations were provided to patient.   Impression and Plan:  Medicare annual wellness visit, subsequent  IGT (impaired glucose tolerance) -     Hemoglobin A1c; Future  Morbid obesity (HCC) -     TSH; Future -     Vitamin B12; Future -     VITAMIN D 25 Hydroxy (Vit-D Deficiency,  Fractures); Future  Low hemoglobin  Screening for osteoporosis -     DG Bone Density; Future  Screening for malignant neoplasm of colon -     Ambulatory referral to Gastroenterology  Vision changes -     Ambulatory referral to Ophthalmology  Immunization due  Primary hypertension -     CBC with Differential/Platelet; Future -     Comprehensive metabolic panel with GFR; Future  Primary osteoarthritis of right knee  Pure hypercholesterolemia, on Pravastatin  -     Lipid panel; Future  Acute midline low back pain without sciatica -      Meloxicam ; Take 1 tablet (7.5 mg total) by mouth daily.  Dispense: 10 tablet; Refill: 0   -Recommend routine eye and dental care. -Healthy lifestyle discussed in detail. -Labs to be updated today. -Prostate cancer screening: N/A Health Maintenance  Topic Date Due   Colon Cancer Screening  09/19/2018   DEXA scan (bone density measurement)  Never done   Pneumococcal Vaccine for age over 24 (1 of 1 - PCV) 03/11/2025*   COVID-19 Vaccine (3 - 2024-25 season) 03/11/2025*   Medicare Annual Wellness Visit  05/15/2025   Mammogram  05/01/2026   DTaP/Tdap/Td vaccine (3 - Td or Tdap) 11/21/2028   Hepatitis C Screening  Completed   Hepatitis B Vaccine  Aged Out   HPV Vaccine  Aged Out   Meningitis B Vaccine  Aged Out   Flu Shot  Discontinued   Zoster (Shingles) Vaccine  Discontinued  *Topic was postponed. The date shown is not the original due date.    - PCV 20 in office today, will obtain Tdap and shingles at the pharmacy. - Referrals placed for colonoscopy, DEXA scan, GYN, ophthalmology.    Tully Theophilus Andrews, MD [facility]

## 2024-05-15 NOTE — Addendum Note (Signed)
 Addended by: WILLO KIRKE DEL on: 05/15/2024 03:28 PM   Modules accepted: Orders

## 2024-05-27 NOTE — Progress Notes (Incomplete)
 68 y.o. No obstetric history on file. postmenopausal female here for annual exam. Married.  She reports ***.  Urine sample provided: ***  Postmenopausal bleeding: *** Pelvic discharge or pain: *** Breast mass, nipple discharge or skin changes : *** Sexually active: ***   Last PAP:     Component Value Date/Time   DIAGPAP  01/23/2018 0000    NEGATIVE FOR INTRAEPITHELIAL LESIONS OR MALIGNANCY.   ADEQPAP  01/23/2018 0000    Satisfactory for evaluation  endocervical/transformation zone component PRESENT.   Last mammogram: 05/01/24 Incomplete - Need additional imaging evaluation Last DXA: *** *** Last colonoscopy: 09/19/08 10 year recall  Exercising: *** Smoker:***  Flowsheet Row Office Visit from 05/15/2024 in [facility]  PHQ-2 Total Score 0       GYN HISTORY: ***  OB History  No obstetric history on file.   Past Medical History:  Diagnosis Date   ACE-inhibitor cough    Anemia    Arthritis    Chronic constipation    Hemorrhoids    Hypertension    Obesity    Past Surgical History:  Procedure Laterality Date   CESAREAN SECTION     x 3    COLONOSCOPY     left knee replacement  03/2008   TOTAL KNEE ARTHROPLASTY Right 10/12/2015   TOTAL KNEE ARTHROPLASTY Right 10/12/2015   Procedure: TOTAL KNEE ARTHROPLASTY;  Surgeon: Dempsey Sensor, MD;  Location: [facility] OR;  Service: Orthopedics;  Laterality: Right;   Current Outpatient Medications on File Prior to Visit  Medication Sig Dispense Refill   acetaminophen  (TYLENOL ) 500 MG tablet Take 1,000 mg by mouth every 6 (six) hours as needed for moderate pain.     amLODipine  (NORVASC ) 5 MG tablet TAKE 1 TABLET BY MOUTH DAILY (Patient not taking: Reported on 05/15/2024) 100 tablet 2   aspirin  EC 81 MG tablet Take 1 tablet (81 mg total) by mouth daily. 90 tablet 1   gabapentin  (NEURONTIN ) 100 MG capsule Take 1 capsule (100 mg total) by mouth 3 (three) times daily. (Patient not taking: Reported on 05/15/2024)  60 capsule 1   losartan -hydrochlorothiazide  (HYZAAR) 100-25 MG tablet TAKE 1 TABLET BY MOUTH DAILY 100 tablet 2   meloxicam  (MOBIC ) 7.5 MG tablet Take 1 tablet (7.5 mg total) by mouth daily. 10 tablet 0   pantoprazole  (PROTONIX ) 20 MG tablet Take 1 tablet (20 mg total) by mouth daily. (Patient not taking: Reported on 05/15/2024) 90 tablet 1   pravastatin  (PRAVACHOL ) 40 MG tablet Take 1 tablet (40 mg total) by mouth daily. 90 tablet 3   No current facility-administered medications on file prior to visit.   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   3 adult children's -- son in [place] business   2 grandsons   Social Drivers of Health   Financial Resource Strain: Low Risk  (05/15/2024)   Overall Financial Resource Strain (CARDIA)  Difficulty of Paying Living Expenses: Not hard at all  Food Insecurity: No Food Insecurity (05/15/2024)   Hunger Vital Sign    Worried About Running Out of Food in the Last Year: Never true    Ran Out of Food in the Last Year: Never true  Transportation Needs: No Transportation Needs (05/15/2024)   PRAPARE - Administrator, Civil Service (Medical): No    Lack of Transportation (Non-Medical): No  Physical Activity: Sufficiently Active (05/15/2024)   Exercise Vital Sign    Days of Exercise per Week: 5 days    Minutes of Exercise per Session: 120 min  Stress: No Stress Concern Present (05/15/2024)   [employer] of Occupational Health - Occupational Stress Questionnaire    Feeling of Stress:  Not at all  Social Connections: Moderately Integrated (05/15/2024)   Social Connection and Isolation Panel    Frequency of Communication with Friends and Family: More than three times a week    Frequency of Social Gatherings with Friends and Family: More than three times a week    Attends Religious Services: More than 4 times per year    Active Member of [employer] or Organizations: No    Attends Banker Meetings: Never    Marital Status: Married  Catering manager Violence: Not At Risk (05/15/2024)   Humiliation, Afraid, Rape, and Kick questionnaire    Fear of Current or Ex-Partner: No    Emotionally Abused: No    Physically Abused: No    Sexually Abused: No   Family History  Problem Relation Age of Onset   Hypertension Mother    Stroke Mother    Kidney failure Mother    Aneurysm Mother    Alcohol abuse Father    Hypertension Father    Heart attack Father    Heart disease Maternal Grandmother    Heart disease Maternal Grandfather    Heart disease Paternal Grandmother    Heart disease Paternal Grandfather    Allergies  Allergen Reactions   Benazepril Hcl Cough      PE There were no vitals filed for this visit. There is no height or weight on file to calculate BMI.  Physical Exam    Assessment and Plan:        There are no diagnoses linked to this encounter. Clotilda FORBES Pa, CMA

## 2024-05-29 ENCOUNTER — Encounter: Admitting: Obstetrics and Gynecology

## 2024-06-03 ENCOUNTER — Ambulatory Visit: Admission: RE | Admit: 2024-06-03 | Source: Ambulatory Visit

## 2024-06-03 ENCOUNTER — Ambulatory Visit
Admission: RE | Admit: 2024-06-03 | Discharge: 2024-06-03 | Disposition: A | Discharge status: Home / Self Care | Source: Ambulatory Visit | Attending: Internal Medicine | Admitting: Internal Medicine

## 2024-06-03 DIAGNOSIS — R928 Other abnormal and inconclusive findings on diagnostic imaging of breast: Secondary | ICD-10-CM

## 2024-06-03 DIAGNOSIS — R922 Inconclusive mammogram: Secondary | ICD-10-CM | POA: Diagnosis not present

## 2024-06-03 DIAGNOSIS — N6489 Other specified disorders of breast: Secondary | ICD-10-CM | POA: Diagnosis not present

## 2024-06-03 DIAGNOSIS — R92321 Mammographic fibroglandular density, right breast: Secondary | ICD-10-CM | POA: Diagnosis not present

## 2024-06-05 DIAGNOSIS — H25813 Combined forms of age-related cataract, bilateral: Secondary | ICD-10-CM | POA: Diagnosis not present

## 2024-06-05 DIAGNOSIS — H35033 Hypertensive retinopathy, bilateral: Secondary | ICD-10-CM | POA: Diagnosis not present

## 2024-06-05 DIAGNOSIS — H5203 Hypermetropia, bilateral: Secondary | ICD-10-CM | POA: Diagnosis not present

## 2024-06-05 DIAGNOSIS — H524 Presbyopia: Secondary | ICD-10-CM | POA: Diagnosis not present

## 2024-06-05 DIAGNOSIS — H52223 Regular astigmatism, bilateral: Secondary | ICD-10-CM | POA: Diagnosis not present

## 2024-06-12 ENCOUNTER — Ambulatory Visit (INDEPENDENT_AMBULATORY_CARE_PROVIDER_SITE_OTHER): Admitting: Obstetrics and Gynecology

## 2024-06-12 ENCOUNTER — Encounter: Payer: Self-pay | Admitting: Obstetrics and Gynecology

## 2024-06-12 ENCOUNTER — Other Ambulatory Visit (HOSPITAL_COMMUNITY)
Admission: RE | Admit: 2024-06-12 | Discharge: 2024-06-12 | Disposition: A | Discharge status: Home / Self Care | Source: Ambulatory Visit | Attending: Obstetrics and Gynecology | Admitting: Obstetrics and Gynecology

## 2024-06-12 VITALS — BP 124/64 | HR 61 | Temp 97.5°F | Ht 66.25 in | Wt 244.0 lb

## 2024-06-12 DIAGNOSIS — Z01419 Encounter for gynecological examination (general) (routine) without abnormal findings: Secondary | ICD-10-CM | POA: Insufficient documentation

## 2024-06-12 DIAGNOSIS — Z124 Encounter for screening for malignant neoplasm of cervix: Secondary | ICD-10-CM

## 2024-06-12 DIAGNOSIS — Z1331 Encounter for screening for depression: Secondary | ICD-10-CM

## 2024-06-12 DIAGNOSIS — Z1151 Encounter for screening for human papillomavirus (HPV): Secondary | ICD-10-CM | POA: Insufficient documentation

## 2024-06-12 NOTE — Progress Notes (Signed)
 68 y.o. [id number] postmenopausal female here for annual exam. Married. Hydrologist.  She reports no other concerns.  Urine sample provided: No  Postmenopausal bleeding: none Pelvic discharge or pain: none Breast mass, nipple discharge or skin changes : none Sexually active: No   Last PAP:     Component Value Date/Time   DIAGPAP  01/23/2018 0000    NEGATIVE FOR INTRAEPITHELIAL LESIONS OR MALIGNANCY.   ADEQPAP  01/23/2018 0000    Satisfactory for evaluation  endocervical/transformation zone component PRESENT.  HPV: neg  Last mammogram: 06/03/24 Bi-Rads 1, B Last DXA: never, ordered by PCP  Last colonoscopy: 09/19/08 10 yr recall  Exercising: Yes, cardio and pickerball Smoker:No  Flowsheet Row Office Visit [facility]12/20[facility] in [facility]  PHQ-2 Total Score 0      GYN HISTORY: No sig hx  OB History  Gravida Para Term Preterm AB Living  3 3 3   3   SAB IAB Ectopic Multiple Live Births      3    # Outcome Date GA Lbr Len/2nd Weight Sex Type Anes PTL Lv  3 Term      CS-Unspec   LIV  2 Term      CS-Unspec   LIV  1 Term      CS-Unspec   LIV   Past Medical History:  Diagnosis Date   ACE-inhibitor cough    Anemia    Arthritis    Chronic constipation    Hemorrhoids    Hypertension    Obesity    Past Surgical History:  Procedure Laterality Date   CESAREAN SECTION     x 3    COLONOSCOPY     left knee replacement  03/2008   TOTAL KNEE ARTHROPLASTY Right 10/12/2015   TOTAL KNEE ARTHROPLASTY Right 10/12/2015   Procedure: TOTAL KNEE ARTHROPLASTY;  Surgeon: Dempsey Sensor, MD;  Location: [facility] OR;  Service: Orthopedics;  Laterality: Right;   Current Outpatient Medications on File Prior to Visit  Medication Sig Dispense Refill   acetaminophen  (TYLENOL ) 500 MG tablet Take 1,000 mg by mouth every 6 (six) hours as needed for moderate pain.     aspirin  EC 81 MG tablet Take 1 tablet (81 mg total) by mouth daily. 90 tablet 1   gabapentin  (NEURONTIN ) 100 MG  capsule Take 1 capsule (100 mg total) by mouth 3 (three) times daily. 60 capsule 1   losartan -hydrochlorothiazide  (HYZAAR) 100-25 MG tablet TAKE 1 TABLET BY MOUTH DAILY 100 tablet 2   meloxicam  (MOBIC ) 7.5 MG tablet Take 1 tablet (7.5 mg total) by mouth daily. 10 tablet 0   pravastatin  (PRAVACHOL ) 40 MG tablet Take 1 tablet (40 mg total) by mouth daily. 90 tablet 3   No current facility-administered medications on file prior to visit.   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 -- son in [employer] business   2 grandsons   Social Drivers of Corporate investment banker Strain: Low Risk  (05/15/2024)   Overall Financial Resource Strain (CARDIA)    Difficulty of Paying Living Expenses: Not hard at all  Food Insecurity: No Food Insecurity (05/15/2024)   Hunger Vital Sign    Worried About Running Out of Food in the Last Year: Never true    Ran Out of Food in the Last Year: Never true  Transportation Needs: No Transportation Needs (05/15/2024)   PRAPARE - Administrator, Civil Service (Medical): No    Lack of Transportation (Non-Medical): No  Physical Activity: Sufficiently Active (05/15/2024)   Exercise Vital Sign    Days of Exercise per Week: 5 days    Minutes of Exercise per Session: 120 min  Stress: No Stress Concern Present (05/15/2024)   [employer] of Occupational Health - Occupational Stress Questionnaire    Feeling of  Stress: Not at all  Social Connections: Moderately Integrated (05/15/2024)   Social Connection and Isolation Panel    Frequency of Communication with Friends and Family: More than three times a week    Frequency of Social Gatherings with Friends and Family: More than three times a week    Attends Religious Services: More than 4 times per year    Active Member of [employer] or Organizations: No    Attends Banker Meetings: Never    Marital Status: Married  Catering manager Violence: Not At Risk (05/15/2024)   Humiliation, Afraid, Rape, and Kick questionnaire    Fear of Current or Ex-Partner: No    Emotionally Abused: No    Physically Abused: No    Sexually Abused: No   Family History  Problem Relation Age of Onset   Hypertension Mother    Stroke Mother    Kidney failure Mother    Aneurysm Mother    Alcohol abuse Father    Hypertension Father    Heart attack Father    Heart disease Maternal Grandmother    Heart disease Maternal Grandfather    Heart disease Paternal Grandmother    Heart disease Paternal Grandfather    Allergies  Allergen Reactions   Benazepril Hcl Cough     PE Today's Vitals   06/12/24 0930  BP: 124/64  Pulse: 61  Temp: (!) 97.5 F (36.4 C)  TempSrc: Oral  SpO2: 99%  Weight: 244 lb (110.7 kg)  Height: 5' 6.25 (1.683 m)   Body mass index is 39.09 kg/m.  Physical Exam Vitals reviewed. Exam conducted with a chaperone present.  Constitutional:      General: She is not in acute distress.    Appearance: Normal appearance.  HENT:     Head: Normocephalic and atraumatic.     Nose: Nose normal.  Eyes:     Extraocular Movements: Extraocular movements intact.     Conjunctiva/sclera: Conjunctivae normal.  Neck:     Thyroid : No thyroid  mass, thyromegaly or thyroid  tenderness.  Pulmonary:     Effort: Pulmonary effort is normal.  Chest:     Chest wall: No mass or tenderness.  Breasts:    Right: Normal. No swelling, mass, nipple discharge, skin  change or tenderness.     Left: Normal. No swelling, mass, nipple discharge, skin change or tenderness.  Abdominal:     General: There is no distension.     Palpations: Abdomen is soft.     Tenderness: There is no abdominal tenderness.  Genitourinary:    General: Normal vulva.     Exam position: Lithotomy position.     Urethra: No prolapse.     Vagina: Normal. No vaginal discharge or bleeding.     Cervix: Normal. No lesion.     Uterus: Normal. Not enlarged and not tender.      Adnexa: Right adnexa normal and left adnexa normal.  Musculoskeletal:        General: Normal range of motion.     Cervical back: Normal range of motion.  Lymphadenopathy:     Upper Body:     Right upper body: No axillary adenopathy.     Left upper body: No axillary adenopathy.     Lower Body: No right inguinal adenopathy. No left inguinal adenopathy.  Skin:    General: Skin is warm and dry.  Neurological:     General: No focal deficit present.     Mental Status: She is alert.  Psychiatric:        Mood and Affect: Mood normal.        Behavior: Behavior normal.       Assessment and Plan:        Encounter for breast and pelvic examination Assessment & Plan: Cervical cancer screening performed according to ASCCP guidelines. Encouraged annual mammogram screening Colonoscopy due, referral sent by PCP, encouraged to scheduled DXA ordered by PCP, # provided to schedule Labs and immunizations with her primary For patients under 50-70yo, I recommend 1200mg  calcium daily and 600IU of vitamin D daily.    Cervical cancer screening -     Cytology - PAP  Negative depression screening   Jaquan Sadowsky LULLA Pa, MD

## 2024-06-12 NOTE — Patient Instructions (Signed)

## 2024-06-12 NOTE — Assessment & Plan Note (Signed)
 Cervical cancer screening performed according to ASCCP guidelines. Encouraged annual mammogram screening Colonoscopy due, referral sent by PCP, encouraged to scheduled DXA ordered by PCP, # provided to schedule Labs and immunizations with her primary For patients under 50-67yo, I recommend 1200mg  calcium daily and 600IU of vitamin D daily.

## 2024-06-17 ENCOUNTER — Ambulatory Visit: Payer: Self-pay | Admitting: Obstetrics and Gynecology

## 2024-06-17 LAB — CYTOLOGY - PAP
Comment: NEGATIVE
Diagnosis: NEGATIVE
High risk HPV: NEGATIVE

## 2024-06-19 DIAGNOSIS — H04123 Dry eye syndrome of bilateral lacrimal glands: Secondary | ICD-10-CM | POA: Diagnosis not present

## 2024-06-19 DIAGNOSIS — H5704 Mydriasis: Secondary | ICD-10-CM | POA: Diagnosis not present

## 2024-07-03 ENCOUNTER — Encounter: Payer: Self-pay | Admitting: Physician Assistant

## 2024-09-05 ENCOUNTER — Other Ambulatory Visit (INDEPENDENT_AMBULATORY_CARE_PROVIDER_SITE_OTHER): Payer: Self-pay

## 2024-09-05 ENCOUNTER — Ambulatory Visit: Admitting: Orthopedic Surgery

## 2024-09-05 VITALS — BP 151/80 | HR 71 | Ht 66.0 in | Wt 244.0 lb

## 2024-09-05 DIAGNOSIS — M545 Low back pain, unspecified: Secondary | ICD-10-CM | POA: Diagnosis not present

## 2024-09-05 DIAGNOSIS — M546 Pain in thoracic spine: Secondary | ICD-10-CM

## 2024-09-05 DIAGNOSIS — M25512 Pain in left shoulder: Secondary | ICD-10-CM

## 2024-09-05 DIAGNOSIS — G8929 Other chronic pain: Secondary | ICD-10-CM

## 2024-09-05 MED ORDER — TRAMADOL HCL 50 MG PO TABS: 100.0000 mg | ORAL_TABLET | Freq: Four times a day (QID) | ORAL | 0 refills | Status: AC | PRN

## 2024-09-05 NOTE — Progress Notes (Signed)
 Orthopedic Spine Surgery Office Note  Assessment: Patient is a 68 y.o. female with left periscapular back pain. Feels it around the level of T4 in the periscapular musculature   Plan: -Explained that initially conservative treatment is tried as a significant number of patients may experience relief with these treatment modalities. Discussed that the conservative treatments include:  -activity modification  -physical therapy  -over the counter pain medications  -medrol  dosepak  -steroid injections -Patient has tried tylenol , ibuprofen   -Recommended PT. Referral provided to her today. Prescribed tramadol  for additional pain relief.  She should use lidocaine  patches as well to help with the pain.  Discussed weight loss -Trigger point injection done today in the office -If she is not doing any better at her next visit, we will order an MRI of the thoracic spine to evaluate further -Patient should return to office in 6-7 weeks, x-rays at next visit: none   Patient expressed understanding of the plan and all questions were answered to the patient's satisfaction.   ___________________________________________________________________________   History:  Patient is a 68 y.o. female who presents today for thoracic spine.  Patient has had 6 months of left-sided thoracic back pain.  She feels it in the area of the periscapular musculature around the level of T4.  There is no trauma or injury that preceded the onset of the pain.  She has no pain radiating into either upper or lower extremity.  She has been using Tylenol  and 800 mg of ibuprofen  regularly to help with the pain.  She has not tried any other treatment so far.   Weakness: Denies Symptoms of imbalance: Denies Paresthesias and numbness: Denies Bowel or bladder incontinence: Denies Saddle anesthesia: Denies  Treatments tried: Tylenol , ibuprofen   Review of systems: Denies fevers and chills, night sweats, unexplained weight loss,  history of cancer.  Has had pain that wakes her at night  Past medical history: HTN Chronic constipation Hemorrhoids  Allergies: ACEi  Past surgical history:  Bilateral TKA C-section  Social history: Denies use of nicotine product (smoking, vaping, patches, smokeless) Alcohol use: Yes, approximately 2 drinks per week Denies recreational drug use   Physical Exam:  BMI of 39.4  General: no acute distress, appears stated age Neurologic: alert, answering questions appropriately, following commands Respiratory: unlabored breathing on room air, symmetric chest rise Psychiatric: appropriate affect, normal cadence to speech   MSK (spine):  -Strength exam      Left  Right EHL    5/5  5/5 TA    5/5  5/5 GSC    5/5  5/5 Knee extension  5/5  5/5 Hip flexion   5/5  5/5  -Sensory exam    Sensation intact to light touch in L3-S1 nerve distributions of bilateral lower extremities  -Achilles DTR: 1/4 on the left, 1/4 on the right -Patellar tendon DTR: 1/4 on the left, 1/4 on the right  -Straight leg raise: Negative bilaterally -Clonus: no beats bilaterally  -Right shoulder exam: No pain through range of motion -Left shoulder exam: No pain through range of motion, negative Jobe, negative belly press, no weakness with external rotation with arm at side  Imaging: XRs of the thoracic spine from 09/05/2024 were independently reviewed and interpreted, showing chronic appearing compression fracture in the lower thoracic spine.  No acute fracture seen.  No dislocation seen.  No spondylolisthesis seen.  Physiologic kyphosis.  XRs of the lumbar spine from 09/05/2024 was independently reviewed and interpreted, showing no evidence of instability on flexion/extension views.  No fracture or dislocation seen.  Disc at loss at L5/S1.   Patient name: Lisa Mcconnell Patient MRN: [id number] Date of visit: 09/05/24'

## 2024-09-16 NOTE — Therapy (Incomplete)
 OUTPATIENT PHYSICAL THERAPY UPPER EXTREMITY EVALUATION   Patient Name: Lisa Mcconnell MRN: [id number] DOB:20-Jul-1956, 68 y.o., female Today's Date: 09/16/2024  END OF SESSION:   Past Medical History:  Diagnosis Date   ACE-inhibitor cough    Anemia    Arthritis    Chronic constipation    Hemorrhoids    Hypertension    Obesity    Past Surgical History:  Procedure Laterality Date   CESAREAN SECTION     x 3    COLONOSCOPY     left knee replacement  03/2008   TOTAL KNEE ARTHROPLASTY Right 10/12/2015   TOTAL KNEE ARTHROPLASTY Right 10/12/2015   Procedure: TOTAL KNEE ARTHROPLASTY;  Surgeon: Dempsey Sensor, MD;  Location: [facility] OR;  Service: Orthopedics;  Laterality: Right;   Patient Active Problem List   Diagnosis Date Noted   Encounter for breast and pelvic examination 06/12/2024   Ankle mass, left 01/25/2022   Insulin resistance 10/30/2018   Pure hypercholesterolemia, on Pravastatin  12/29/2017   ASCVD (arteriosclerotic cardiovascular disease) risk 9.5% 12/29/2017   Morbid obesity (HCC) 12/29/2017   Primary osteoarthritis of right knee 10/11/2015   Hypertension, on Hyzaar and Norvasc  10/06/2008    PCP: Tully CINDERELLA Theophilus Delma, MD  REFERRING PROVIDER: Ozell DELENA Ada, MD   REFERRING DIAG: [phone] (ICD-10-CM) - Periscapular pain of left shoulder  THERAPY DIAG:  No diagnosis found.  Rationale for Evaluation and Treatment: Rehabilitation  ONSET DATE: ***  SUBJECTIVE:                                                                                                                                                                                      SUBJECTIVE STATEMENT: *** Hand dominance: {MISC; OT HAND DOMINANCE:[phone]}  PERTINENT HISTORY: ***  PAIN:  Are you having pain? Yes: NPRS scale: *** Pain location: *** Pain description: *** Aggravating factors: *** Relieving factors: ***  PRECAUTIONS: {Therapy precautions:24002}  RED FLAGS: {PT Red  Flags:29287}   WEIGHT BEARING RESTRICTIONS: No; chronic compression fracture  FALLS:  Has patient fallen in last 6 months? {fallsyesno:27318}  LIVING ENVIRONMENT: Lives with: {[facility] lives with:25569::lives with their family} Lives in: {Lives in:25570} Stairs: {opstairs:27293} Has following equipment at home: {Assistive devices:23999}  OCCUPATION: ***  PLOF: {PLOF:24004}  PATIENT GOALS: ***  NEXT MD VISIT: ***  OBJECTIVE:  Note: Objective measures were completed at Evaluation unless otherwise noted.  DIAGNOSTIC FINDINGS:  ***  PATIENT SURVEYS :  PSFS: THE PATIENT SPECIFIC FUNCTIONAL SCALE  Place score of 0-10 (0 = unable to perform activity and 10 = able to perform activity at the same level as before injury or problem)  Activity Date: 09/17/2024  2.     3.     4.      Total Score ***      Total Score = Sum of activity scores/number of activities  Minimally Detectable Change: 3 points (for single activity); 2 points (for average score)  [facility] (nd). The Patient Specific Functional Scale . Retrieved from [email or web address]   COGNITION: Overall cognitive status: {cognition:24006}     SENSATION: {sensation:27233}  POSTURE: ***  UPPER EXTREMITY ROM:   {AROM/PROM:27142} ROM Right eval Left eval  Shoulder flexion    Shoulder extension    Shoulder abduction    Shoulder adduction    Shoulder internal rotation    Shoulder external rotation    Elbow flexion    Elbow extension    Wrist flexion    Wrist extension    Wrist ulnar deviation    Wrist radial deviation    Wrist pronation    Wrist supination    (Blank rows = not tested)  UPPER EXTREMITY MMT:  MMT Right eval Left eval  Shoulder flexion    Shoulder extension    Shoulder abduction    Shoulder adduction    Shoulder internal rotation    Shoulder external rotation    Middle trapezius    Lower trapezius     Elbow flexion    Elbow extension    Wrist flexion    Wrist extension    Wrist ulnar deviation    Wrist radial deviation    Wrist pronation    Wrist supination    Grip strength (lbs)    (Blank rows = not tested)  SHOULDER SPECIAL TESTS: Impingement tests: {shoulder impingement test:25231:a} SLAP lesions: {SLAP lesions:25232} Instability tests: {shoulder instability test:25233} Rotator cuff assessment: {rotator cuff assessment:25234} Biceps assessment: {biceps assessment:25235}  JOINT MOBILITY TESTING:  ***  PALPATION:  ***                                                                                                                             TREATMENT DATE: ***   PATIENT EDUCATION: Education details: *** Person educated: {Person educated:25204} Education method: {Education Method:25205} Education comprehension: {Education Comprehension:25206}  HOME EXERCISE PROGRAM: ***  ASSESSMENT:  CLINICAL IMPRESSION: Patient is a *** y.o. *** who was seen today for physical therapy evaluation and treatment for ***.    OBJECTIVE IMPAIRMENTS: {opptimpairments:25111}.   ACTIVITY LIMITATIONS: {activitylimitations:27494}  PARTICIPATION LIMITATIONS: {participationrestrictions:25113}  PERSONAL FACTORS: {Personal factors:25162} are also affecting patient's functional outcome.   REHAB POTENTIAL: {rehabpotential:25112}  CLINICAL DECISION MAKING: {clinical decision making:25114}  EVALUATION COMPLEXITY: {Evaluation complexity:25115}  GOALS: Goals reviewed with patient? {yes/no:20286}  SHORT TERM GOALS: Target date: ***  *** Baseline: Goal status: {GOALSTATUS:25110}  2.  *** Baseline:  Goal status: {GOALSTATUS:25110}  3.  *** Baseline:  Goal status: {GOALSTATUS:25110}  4.  *** Baseline:  Goal status: {GOALSTATUS:25110}  5.  *** Baseline:  Goal status: {GOALSTATUS:25110}  6.  *** Baseline:  Goal status: {GOALSTATUS:25110}  LONG TERM  GOALS: Target date:  ***  *** Baseline:  Goal status: {GOALSTATUS:25110}  2.  *** Baseline:  Goal status: {GOALSTATUS:25110}  3.  *** Baseline:  Goal status: {GOALSTATUS:25110}  4.  *** Baseline:  Goal status: {GOALSTATUS:25110}  5.  *** Baseline:  Goal status: {GOALSTATUS:25110}  6.  *** Baseline:  Goal status: {GOALSTATUS:25110}  PLAN: PT FREQUENCY: {rehab frequency:25116}  PT DURATION: {rehab duration:25117}  PLANNED INTERVENTIONS: {rehab planned interventions:25118::97110-Therapeutic exercises,97530- Therapeutic [phone]- Neuromuscular re-education,97535- Self Rjmz,02859- Manual therapy,Patient/Family education}  PLAN FOR NEXT SESSION: ***   Susannah Daring, PT, DPT 09/16/24 3:51 PM

## 2024-09-17 ENCOUNTER — Ambulatory Visit

## 2024-09-17 ENCOUNTER — Encounter: Payer: Self-pay | Admitting: Physical Therapy

## 2024-09-17 ENCOUNTER — Ambulatory Visit (INDEPENDENT_AMBULATORY_CARE_PROVIDER_SITE_OTHER): Admitting: Physical Therapy

## 2024-09-17 DIAGNOSIS — M546 Pain in thoracic spine: Secondary | ICD-10-CM | POA: Diagnosis not present

## 2024-09-17 DIAGNOSIS — M6281 Muscle weakness (generalized): Secondary | ICD-10-CM

## 2024-09-17 DIAGNOSIS — M5459 Other low back pain: Secondary | ICD-10-CM

## 2024-09-17 DIAGNOSIS — M25512 Pain in left shoulder: Secondary | ICD-10-CM

## 2024-09-17 NOTE — Therapy (Signed)
 OUTPATIENT PHYSICAL THERAPY EVALUATION   Patient Name: Lisa Mcconnell MRN: [id number] DOB:1956/09/24, 68 y.o., female Today's Date: 09/17/2024  END OF SESSION:  PT End of Session - 09/17/24 0940     Visit Number 1    Number of Visits 20    Date for Recertification  11/29/24    Authorization Type UHC/Medicare    Progress Note Due on Visit 10    PT Start Time 0935    PT Stop Time 1015    PT Time Calculation (min) 40 min    Activity Tolerance Patient tolerated treatment well    Behavior During Therapy [facility] for tasks assessed/performed          Past Medical History:  Diagnosis Date   ACE-inhibitor cough    Anemia    Arthritis    Chronic constipation    Hemorrhoids    Hypertension    Obesity    Past Surgical History:  Procedure Laterality Date   CESAREAN SECTION     x 3    COLONOSCOPY     left knee replacement  03/2008   TOTAL KNEE ARTHROPLASTY Right 10/12/2015   TOTAL KNEE ARTHROPLASTY Right 10/12/2015   Procedure: TOTAL KNEE ARTHROPLASTY;  Surgeon: Dempsey Sensor, MD;  Location: [facility] OR;  Service: Orthopedics;  Laterality: Right;   Patient Active Problem List   Diagnosis Date Noted   Encounter for breast and pelvic examination 06/12/2024   Ankle mass, left 01/25/2022   Insulin resistance 10/30/2018   Pure hypercholesterolemia, on Pravastatin  12/29/2017   ASCVD (arteriosclerotic cardiovascular disease) risk 9.5% 12/29/2017   Morbid obesity (HCC) 12/29/2017   Primary osteoarthritis of right knee 10/11/2015   Hypertension, on Hyzaar and Norvasc  10/06/2008    PCP: Theophilus Andrews, Tully GRADE, MD   REFERRING PROVIDER: Georgina Ozell LABOR, MD   REFERRING DIAG:  Diagnosis  M25.512 (ICD-10-CM) - Periscapular pain of left shoulder    THERAPY DIAG:  Acute left-sided thoracic back pain  Acute pain of left shoulder  Muscle weakness (generalized)  Rationale for Evaluation and Treatment: Rehabilitation  ONSET DATE: for years  SUBJECTIVE:                                                                                                                                                                                       SUBJECTIVE STATEMENT:  Patient is a 68 y.o. female with left periscapular back pain. Feels it around the level of T4 in the periscapular musculature. Pt reporting pain extends into her mid and low back.     PERTINENT HISTORY: See PMH  PAIN:  NPRS scale: no pain at rest, worse pain 8/10 Pain location: left parascapular,  mid back, low bac Pain description: achy Aggravating factors:  certain activities Relieving factors: sitting, resting, pillows  PRECAUTIONS: None  WEIGHT BEARING RESTRICTIONS: No  FALLS:  Has patient fallen in last 6 months? 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  No fracture or dislocation seen.  Disc at loss at L5/S1.   PATIENT SURVEYS:  Patient-Specific Activity Scoring Scheme  0 represents "unable to perform." 10 represents "able to perform at prior level. 0 1 2 3 4 5 6 7 8 9  10 (Date and Score)   Activity Eval  09/17/24    1. Bending over to pick something off floor  3     2. Standing prolonged   3    3. Reaching with added weight helping pt's at work 3   4.    5.    Score 3    Total score = sum of the activity scores/number of activities Minimum detectable change (90%CI) for average score = 2 points Minimum detectable change (90%CI) for single activity score = 3 points  COGNITION: Overall cognitive status: WFL     SENSATION: WFL  POSTURE: Forward head, rounder shoulders,   UPPER EXTREMITY ROM:   ROM 09/17/24 Right Eval 09/17/24 active  Left Eval 09/17/24 active  Shoulder flexion  138 142  Shoulder extension  40 40  Shoulder abduction  155 152  Shoulder adduction     Shoulder internal rotation     Shoulder external rotation  74 70  Elbow flexion     Elbow extension     Lumbar flexion 70    Lumbar extension 18 c pain    Rotation  WFL WFL  Lateral lean  30 18            (Blank rows = not tested)  UPPER EXTREMITY MMT:  MMT Right Eval 09/17/24 HHD in pounds  Left Eval 09/17/24 HHD in pounds  Shoulder flexion 14.7 15.0 15.1 12.5  Shoulder extension    Shoulder abduction 10.2 12.9 12.3 12.4  Shoulder adduction    Shoulder internal rotation    Shoulder external rotation    Middle trapezius    Lower trapezius    Elbow flexion    Elbow extension    Wrist flexion    Wrist extension    Wrist ulnar deviation    Wrist radial deviation    Wrist pronation    Wrist supination    Grip strength (lbs)    (Blank rows = not tested)   PALPATION:  TTP: left rhomboids  TODAY'S TREATMENT:                                                                                                       DATE:09/17/24 Therex: HEP instruction/performance c cues for techniques, handout provided.  Trial set performed of each for comprehension and symptom assessment.  See below for exercise list   PATIENT EDUCATION: Education details: HEP, POC Person educated: Patient Education method: Explanation, Demonstration, Verbal cues, and Handouts Education comprehension: verbalized understanding, returned demonstration, and verbal cues required  HOME EXERCISE PROGRAM: Access Code: [email or web address] URL: [email or web address] Date: 09/17/2024 Prepared by: Delon Lunger  Exercises - Shoulder External Rotation and Scapular  Retraction with Resistance  - 2 x daily - 7 x weekly - 15 reps - Standing Shoulder Row with Anchored Resistance  - 2 x daily - 7 x weekly - 15 reps - 3 seconds hold - Seated Upper Trap Stretch  - 2 x daily - 7 x weekly - 4 reps - 10 seconds hold - Mid-Thoracic Spine Mobilization with Taped Tennis Balls Shoulder Flexion at Wall  - 2 x daily - 7 x weekly  ASSESSMENT:  CLINICAL IMPRESSION: Patient is a 68 y.o. who comes to clinic with complaints of mid throacic and left parascapular pain with mobility, strength and movement coordination deficits that impair their ability to perform usual daily and recreational functional activities without increase difficulty/symptoms at this time.  Patient to benefit from skilled PT services to address impairments and limitations to improve to previous level of function without restriction secondary to condition.   OBJECTIVE IMPAIRMENTS: decreased mobility, difficulty walking, decreased ROM, decreased strength, prosthetic dependency , and pain.   ACTIVITY LIMITATIONS: lifting, bending, sitting, squatting, and reach over head  PARTICIPATION LIMITATIONS: community activity and occupation  PERSONAL FACTORS: see PMH above are also affecting patient's functional outcome.   REHAB POTENTIAL: Good  CLINICAL DECISION MAKING: Stable/uncomplicated  EVALUATION COMPLEXITY: Low   GOALS: Goals reviewed with patient? Yes  SHORT TERM GOALS: (target date for Short term goals are 3 weeks 10/08/2024)  1.Patient will demonstrate independent use of home exercise program to maintain progress from in clinic treatments. Goal status: New  LONG TERM GOALS: (target dates for all long term goals are 10 weeks  11/29/24 )   1. Patient will demonstrate/report pain at worst less than or equal to 2/10 to facilitate minimal limitation in daily activity secondary to pain symptoms. Goal status: New   2. Patient will demonstrate independent use of home exercise program to facilitate  ability to maintain/progress functional gains from skilled physical therapy services. Goal status: New   3. Patient will demonstrate Patient specific functional scale avg > or = 5 to indicate reduced disability due to condition.  Goal status: New   4.  Patient will demonstrate lifting using correct body mechanics 10 pounds from floor to counter surface with no pain.   Goal status: New   5.  Patient will demonstrate improved left  shoulder flexion and abduction strength by 5 pounds using HHD for improved functional mobility and reaching activities.    Goal status:  New   6.  Pt will report she is able to stand for >/= 20 minutes with back pain </= 3/10.  Goal status: New     PLAN:  PT FREQUENCY: 1x/week  PT DURATION: 10 weeks  PLANNED INTERVENTIONS: Can include 02853- PT Re-evaluation, 97110-Therapeutic exercises, 97530- Therapeutic activity, [id number]- Neuromuscular re-education, 97535- Self Care, 97140- Manual therapy, [phone]- Gait training, [phone]- Orthotic Fit/training, [phone]- Canalith repositioning, [id number]- Aquatic Therapy, [phone]- Electrical stimulation (unattended), [id number] Physical performance testing, 97016- Vasopneumatic device, [id number]- Ultrasound, [id number]- Traction (mechanical), [id number]- Ionotophoresis 4mg /ml Dexamethasone,  20560 - Needle insertion w/o injection 1 or 2 muscles, 20561 - Needle insertion w/o injection 3 or more muscles.   Patient/Family education, Balance training, Stair training, Taping, Dry Needling, Joint mobilization, Joint manipulation, Spinal manipulation, Spinal mobilization, Scar mobilization, Vestibular training, Visual/preceptual remediation/compensation, DME instructions, Cryotherapy, and Moist heat.  All performed as medically necessary.  All included unless contraindicated  PLAN FOR NEXT SESSION: Review HEP knowledge/results.   Date of referral: Theophilus Andrews, Tully GRADE, MD  Referring provider: Georgina Ozell LABOR, MD  Referring diagnosis?  Diagnosis   M25.512 (ICD-10-CM) - Periscapular pain of left shoulder   Treatment diagnosis? (if different than referring diagnosis) m54.6, m25.512, m62.81, m54.59  What was this (referring dx) caused by? Ongoing Issue  Lysle of Condition: Initial Onset (within last 3 months)   Laterality: Lt  Current Functional Measure Score: Patient Specific Functional Scale 3  Objective measurements identify impairments when they are compared to normal values, the uninvolved extremity, and prior level of function.  [x]  Yes  []  No  Objective assessment of functional ability: Minimal functional limitations   Briefly describe symptoms: left parascapular pain, mid thoracic pain, low back pain, difficulty with standing, reaching and bending  How did symptoms start: over time over the last year or greater  Average pain intensity:  Last 24 hours: 8/10  Past week: 8/10  How often does the pt experience symptoms? Frequently  How much have the symptoms interfered with usual daily activities? Moderately  How has condition changed since care began at this facility? NA - initial visit  In general, how is the patients overall health? Good   BACK PAIN (STarT Back Screening Tool) Has pain spread down the leg(s) at some time in the last 2 weeks? no Has there been pain in the shoulder or neck at some time in the last 2 weeks? yes Has the pt only walked short distances because of back pain? yes Has patient dressed more slowly because of back pain in the past 2 weeks? yes Does patient think it's not safe for a person with this condition to be physically active? no Does patient have worrying thoughts a lot of the time? no Does patient feel back pain is terrible and will never get any better? no Has patient stopped enjoying things they usually enjoy? sometimes    Delon JONELLE Lunger, PT, MPT 09/17/2024, 9:41 AM

## 2024-10-08 ENCOUNTER — Telehealth: Payer: Self-pay | Admitting: Physical Therapy

## 2024-10-08 ENCOUNTER — Encounter: Admitting: Physical Therapy

## 2024-10-08 NOTE — Telephone Encounter (Signed)
 LVM for pt as she did not show for her PT appt. Reminded of next scheduled appt and to call or cancel through MyChart if she is unable to attend.    Corean JULIANNA Ku, PT, DPT 10/08/24 9:49 AM

## 2024-10-08 NOTE — Therapy (Incomplete)
 OUTPATIENT PHYSICAL THERAPY TREATMENT   Patient Name: Lisa Mcconnell MRN: [id number] DOB:09-Apr-1956, 68 y.o., female Today's Date: 10/08/2024  END OF SESSION:    Past Medical History:  Diagnosis Date   ACE-inhibitor cough    Anemia    Arthritis    Chronic constipation    Hemorrhoids    Hypertension    Obesity    Past Surgical History:  Procedure Laterality Date   CESAREAN SECTION     x 3    COLONOSCOPY     left knee replacement  03/2008   TOTAL KNEE ARTHROPLASTY Right 10/12/2015   TOTAL KNEE ARTHROPLASTY Right 10/12/2015   Procedure: TOTAL KNEE ARTHROPLASTY;  Surgeon: Dempsey Sensor, MD;  Location: [facility] OR;  Service: Orthopedics;  Laterality: Right;   Patient Active Problem List   Diagnosis Date Noted   Encounter for breast and pelvic examination 06/12/2024   Ankle mass, left 01/25/2022   Insulin resistance 10/30/2018   Pure hypercholesterolemia, on Pravastatin  12/29/2017   ASCVD (arteriosclerotic cardiovascular disease) risk 9.5% 12/29/2017   Morbid obesity (HCC) 12/29/2017   Primary osteoarthritis of right knee 10/11/2015   Hypertension, on Hyzaar and Norvasc  10/06/2008    PCP: Theophilus Andrews, Tully GRADE, MD   REFERRING PROVIDER: Georgina Ozell LABOR, MD   REFERRING DIAG:  Diagnosis  M25.512 (ICD-10-CM) - Periscapular pain of left shoulder    THERAPY DIAG:  No diagnosis found.  Rationale for Evaluation and Treatment: Rehabilitation  ONSET DATE: for years  SUBJECTIVE:                                                                                                                                                                                      SUBJECTIVE STATEMENT: ***  Patient is a 68 y.o. female with left periscapular back pain. Feels it around the level of T4 in the periscapular musculature. Pt reporting pain extends into her mid and low back.     PERTINENT HISTORY: See PMH  PAIN:  NPRS scale: no pain at rest, worse pain 8/10 Pain location: left  parascapular, mid back, low bac Pain description: achy Aggravating factors:  certain activities Relieving factors: sitting, resting, pillows  PRECAUTIONS: None  WEIGHT BEARING RESTRICTIONS: No  FALLS:  Has patient fallen in last 6 months? 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  No fracture or dislocation seen.  Disc at loss at L5/S1.   PATIENT SURVEYS:  Patient-Specific Activity Scoring Scheme  0 represents "unable to perform." 10 represents "able to perform at prior level. 0 1 2 3 4 5 6 7 8 9  10 (Date and Score)   Activity Eval  09/17/24    1. Bending over to pick something off floor  3     2. Standing prolonged   3    3. Reaching with added weight helping pt's at work 3   4.    5.    Score 3    Total score = sum of the activity scores/number of activities Minimum detectable change (90%CI) for average score = 2 points Minimum detectable change (90%CI) for single activity score = 3 points  COGNITION: Overall cognitive status: WFL     SENSATION: WFL  POSTURE: Forward head, rounder shoulders,   UPPER EXTREMITY ROM:   ROM 09/17/24 Right Eval 09/17/24 active Left Eval 09/17/24 active  Shoulder flexion  138 142  Shoulder extension  40 40  Shoulder abduction  155 152  Shoulder adduction     Shoulder internal rotation     Shoulder external rotation  74 70  Elbow flexion     Elbow extension     Lumbar flexion 70    Lumbar extension 18 c pain    Rotation  WFL WFL  Lateral lean  30 18            (Blank rows = not tested)  UPPER EXTREMITY MMT:  MMT Right Eval 09/17/24 HHD in pounds   Left Eval 09/17/24 HHD in pounds  Shoulder flexion 14.7 15.0 15.1 12.5  Shoulder extension    Shoulder abduction 10.2 12.9 12.3 12.4  Shoulder adduction    Shoulder internal rotation    Shoulder external rotation    Middle trapezius    Lower trapezius    Elbow flexion    Elbow extension    Wrist flexion    Wrist extension    Wrist ulnar deviation    Wrist radial deviation    Wrist pronation    Wrist supination    Grip strength (lbs)    (Blank rows = not tested)   PALPATION:  TTP: left rhomboids                                                                                                                                                                                                   TODAY'S TREATMENT 10/08/24 ***     09/17/24 Therex: HEP instruction/performance c cues for techniques, handout provided.  Trial set performed of  each for comprehension and symptom assessment.  See below for exercise list   PATIENT EDUCATION: Education details: HEP, POC Person educated: Patient Education method: Explanation, Demonstration, Verbal cues, and Handouts Education comprehension: verbalized understanding, returned demonstration, and verbal cues required  HOME EXERCISE PROGRAM: Access Code: [email or web address] URL: [email or web address] Date: 09/17/2024 Prepared by: Delon Lunger  Exercises - Shoulder External Rotation and Scapular Retraction with Resistance  - 2 x daily - 7 x weekly - 15 reps - Standing Shoulder Row with Anchored Resistance  - 2 x daily - 7 x weekly - 15 reps - 3 seconds hold - Seated Upper Trap Stretch  - 2 x daily - 7 x weekly - 4 reps - 10 seconds hold - Mid-Thoracic Spine Mobilization with Taped Tennis Balls Shoulder Flexion at Wall  - 2 x daily - 7 x weekly  ASSESSMENT:  CLINICAL IMPRESSION: *** Patient is a 68 y.o. who comes to clinic with complaints of mid throacic and left parascapular pain with mobility, strength and movement  coordination deficits that impair their ability to perform usual daily and recreational functional activities without increase difficulty/symptoms at this time.  Patient to benefit from skilled PT services to address impairments and limitations to improve to previous level of function without restriction secondary to condition.   OBJECTIVE IMPAIRMENTS: decreased mobility, difficulty walking, decreased ROM, decreased strength, prosthetic dependency , and pain.   ACTIVITY LIMITATIONS: lifting, bending, sitting, squatting, and reach over head  PARTICIPATION LIMITATIONS: community activity and occupation  PERSONAL FACTORS: see PMH above are also affecting patient's functional outcome.   REHAB POTENTIAL: Good  CLINICAL DECISION MAKING: Stable/uncomplicated  EVALUATION COMPLEXITY: Low   GOALS: Goals reviewed with patient? Yes  SHORT TERM GOALS: (target date for Short term goals are 3 weeks 10/08/2024)  1.Patient will demonstrate independent use of home exercise program to maintain progress from in clinic treatments. Goal status: New  LONG TERM GOALS: (target dates for all long term goals are 10 weeks  11/29/24 )   1. Patient will demonstrate/report pain at worst less than or equal to 2/10 to facilitate minimal limitation in daily activity secondary to pain symptoms. Goal status: New   2. Patient will demonstrate independent use of home exercise program to facilitate ability to maintain/progress functional gains from skilled physical therapy services. Goal status: New   3. Patient will demonstrate Patient specific functional scale avg > or = 5 to indicate reduced disability due to condition.  Goal status: New   4.  Patient will demonstrate lifting using correct body mechanics 10 pounds from floor to counter surface with no pain.   Goal status: New   5.  Patient will demonstrate improved left  shoulder flexion and abduction strength by 5 pounds using HHD for improved functional mobility  and reaching activities.    Goal status: New   6.  Pt will report she is able to stand for >/= 20 minutes with back pain </= 3/10.  Goal status: New     PLAN:  PT FREQUENCY: 1x/week  PT DURATION: 10 weeks  PLANNED INTERVENTIONS: Can include 02853- PT Re-evaluation, 97110-Therapeutic exercises, 97530- Therapeutic activity, [id number]- Neuromuscular re-education, 97535- Self Care, 97140- Manual therapy, [phone]- Gait training, [phone]- Orthotic Fit/training, [phone]- Canalith repositioning, [id number]- Aquatic Therapy, [phone]- Electrical stimulation (unattended), [id number] Physical performance testing, 97016- Vasopneumatic device, [id number]- Ultrasound, [id number]- Traction (mechanical), [id number]- Ionotophoresis 4mg /ml Dexamethasone,  20560 - Needle insertion w/o injection 1 or 2 muscles, 20561 - Needle insertion w/o injection 3 or more muscles.   Patient/Family  education, Balance training, Stair training, Taping, Dry Needling, Joint mobilization, Joint manipulation, Spinal manipulation, Spinal mobilization, Scar mobilization, Vestibular training, Visual/preceptual remediation/compensation, DME instructions, Cryotherapy, and Moist heat.  All performed as medically necessary.  All included unless contraindicated  PLAN FOR NEXT SESSION: *** Review HEP knowledge/results.   Date of referral: Theophilus Andrews, Tully GRADE, MD  Referring provider: Georgina Ozell LABOR, MD  Referring diagnosis?  Diagnosis  M25.512 (ICD-10-CM) - Periscapular pain of left shoulder   Treatment diagnosis? (if different than referring diagnosis) m54.6, m25.512, m62.81, m54.59  What was this (referring dx) caused by? Ongoing Issue  Lysle of Condition: Initial Onset (within last 3 months)   Laterality: Lt  Current Functional Measure Score: Patient Specific Functional Scale 3  Objective measurements identify impairments when they are compared to normal values, the uninvolved extremity, and prior level of function.  [x]  Yes  []  No  Objective  assessment of functional ability: Minimal functional limitations   Briefly describe symptoms: left parascapular pain, mid thoracic pain, low back pain, difficulty with standing, reaching and bending  How did symptoms start: over time over the last year or greater  Average pain intensity:  Last 24 hours: 8/10  Past week: 8/10  How often does the pt experience symptoms? Frequently  How much have the symptoms interfered with usual daily activities? Moderately  How has condition changed since care began at this facility? NA - initial visit  In general, how is the patients overall health? Good   BACK PAIN (STarT Back Screening Tool) Has pain spread down the leg(s) at some time in the last 2 weeks? no Has there been pain in the shoulder or neck at some time in the last 2 weeks? yes Has the pt only walked short distances because of back pain? yes Has patient dressed more slowly because of back pain in the past 2 weeks? yes Does patient think it's not safe for a person with this condition to be physically active? no Does patient have worrying thoughts a lot of the time? no Does patient feel back pain is terrible and will never get any better? no Has patient stopped enjoying things they usually enjoy? sometimes    Corean JULIANNA Ku, PT, DPT 10/08/24 7:15 AM

## 2024-10-14 ENCOUNTER — Telehealth: Payer: Self-pay | Admitting: Physical Therapy

## 2024-10-14 NOTE — Telephone Encounter (Signed)
 I called pt to cancel her 9:30 PT appointment on 10/15/24 due to our clinic opening at 10:00 tomorrow. Pt's appointment time was moved to 2:30 on 10/15/24. If she needs to reschedule our clinic number was left [phone].  Delon Lunger, PT, MPT 10/14/24 2:21 PM

## 2024-10-15 ENCOUNTER — Encounter: Admitting: Physical Therapy

## 2024-10-15 ENCOUNTER — Encounter: Admitting: Rehabilitative and Restorative Service Providers"

## 2024-10-15 ENCOUNTER — Telehealth: Payer: Self-pay | Admitting: Rehabilitative and Restorative Service Providers"

## 2024-10-15 NOTE — Telephone Encounter (Signed)
 Called patient after 15 mins no show for appointment today.  Left message and reminder of next appointment time, as well as call back number.  2nd no show in a row for patient so cancelled additional visits per policy to schedule 1 visit at a time.   Ozell Silvan, PT, DPT, OCS, ATC 10/15/24  2:49 PM

## 2024-10-15 NOTE — Therapy (Incomplete)
 OUTPATIENT PHYSICAL THERAPY TREATMENT   Patient Name: Lisa Mcconnell MRN: [id number] DOB:[birth year], 68 y.o., female Today's Date: 10/15/2024  END OF SESSION:    Past Medical History:  Diagnosis Date   ACE-inhibitor cough    Anemia    Arthritis    Chronic constipation    Hemorrhoids    Hypertension    Obesity    Past Surgical History:  Procedure Laterality Date   CESAREAN SECTION     x 3    COLONOSCOPY     left knee replacement  03/2008   TOTAL KNEE ARTHROPLASTY Right 10/12/2015   TOTAL KNEE ARTHROPLASTY Right 10/12/2015   Procedure: TOTAL KNEE ARTHROPLASTY;  Surgeon: Dempsey Sensor, MD;  Location: [facility] OR;  Service: Orthopedics;  Laterality: Right;   Patient Active Problem List   Diagnosis Date Noted   Encounter for breast and pelvic examination 06/12/2024   Ankle mass, left 01/25/2022   Insulin resistance 10/30/2018   Pure hypercholesterolemia, on Pravastatin  12/29/2017   ASCVD (arteriosclerotic cardiovascular disease) risk 9.5% 12/29/2017   Morbid obesity (HCC) 12/29/2017   Primary osteoarthritis of right knee 10/11/2015   Hypertension, on Hyzaar and Norvasc  10/06/2008    PCP: Theophilus Andrews, Tully GRADE, MD   REFERRING PROVIDER: Georgina Ozell LABOR, MD   REFERRING DIAG:  Diagnosis  M25.512 (ICD-10-CM) - Periscapular pain of left shoulder    THERAPY DIAG:  No diagnosis found.  Rationale for Evaluation and Treatment: Rehabilitation  ONSET DATE: for years  SUBJECTIVE:                                                                                                                                                                                      SUBJECTIVE STATEMENT:  Patient is a 68 y.o. female with left periscapular back pain. Feels it around the level of T4 in the periscapular musculature. Pt reporting pain extends into her mid and low back.     PERTINENT HISTORY: See PMH  PAIN:  NPRS scale: no pain at rest, worse pain 8/10 Pain location: left  parascapular, mid back, low bac Pain description: achy Aggravating factors:  certain activities Relieving factors: sitting, resting, pillows  PRECAUTIONS: None  WEIGHT BEARING RESTRICTIONS: No  FALLS:  Has patient fallen in last 6 months? 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  No fracture or dislocation seen.  Disc at loss at L5/S1.   PATIENT SURVEYS:  Patient-Specific Activity Scoring Scheme  0 represents "unable to perform." 10 represents "able to perform at prior level. 0 1 2 3 4 5 6 7 8 9  10 (Date and Score)   Activity Eval  09/17/24    1. Bending over to pick something off floor  3     2. Standing prolonged   3    3. Reaching with added weight helping pt's at work 3   4.    5.    Score 3    Total score = sum of the activity scores/number of activities Minimum detectable change (90%CI) for average score = 2 points Minimum detectable change (90%CI) for single activity score = 3 points  COGNITION: 09/17/2024 Overall cognitive status: WFL     SENSATION: 09/17/2024 WFL  POSTURE: 09/17/2024 Forward head, rounder shoulders,   ROM:   ROM 09/17/24 Right Eval 09/17/24 active Left Eval 09/17/24 active  Shoulder flexion  138 142  Shoulder extension  40 40  Shoulder abduction  155 152  Shoulder adduction     Shoulder internal rotation     Shoulder external rotation  74 70  Elbow flexion     Elbow extension     Lumbar flexion 70    Lumbar extension 18 c pain    Rotation  [facility] WFL  Lateral lean  30 18            (Blank rows = not tested)  UPPER EXTREMITY MMT:  MMT Right Eval 09/17/24 HHD in pounds   Left Eval 09/17/24 HHD in pounds  Shoulder flexion 14.7 15.0 15.1 12.5  Shoulder extension    Shoulder abduction 10.2 12.9 12.3 12.4  Shoulder adduction    Shoulder internal rotation    Shoulder external rotation    Middle trapezius    Lower trapezius    Elbow flexion    Elbow extension    Wrist flexion    Wrist extension    Wrist ulnar deviation    Wrist radial deviation    Wrist pronation    Wrist supination    Grip strength (lbs)    (Blank rows = not tested)   PALPATION:  09/17/2024 TTP: left rhomboids                                                                                                                                                                                                   TODAY'S TREATMENT:  DATE:10/15/2024 Therex:     TODAY'S TREATMENT:                                                                                                       DATE:09/17/24 Therex: HEP instruction/performance c cues for techniques, handout provided.  Trial set performed of each for comprehension and symptom assessment.  See below for exercise list   PATIENT EDUCATION: Education details: HEP, POC Person educated: Patient Education method: Explanation, Demonstration, Verbal cues, and Handouts Education comprehension: verbalized understanding, returned demonstration, and verbal cues required  HOME EXERCISE PROGRAM: Access Code: [email or web address] URL: [email or web address] Date: 09/17/2024 Prepared by: Delon Lunger  Exercises - Shoulder External Rotation and Scapular Retraction with Resistance  - 2 x daily - 7 x weekly - 15 reps - Standing Shoulder Row with Anchored Resistance  - 2 x daily - 7 x weekly - 15 reps - 3 seconds hold - Seated Upper Trap Stretch  - 2 x daily - 7 x weekly - 4 reps - 10 seconds hold - Mid-Thoracic Spine Mobilization with Taped  Tennis Balls Shoulder Flexion at Wall  - 2 x daily - 7 x weekly  ASSESSMENT:  CLINICAL IMPRESSION: Patient is a 68 y.o. who comes to clinic with complaints of mid throacic and left parascapular pain with mobility, strength and movement coordination deficits that impair their ability to perform usual daily and recreational functional activities without increase difficulty/symptoms at this time.  Patient to benefit from skilled PT services to address impairments and limitations to improve to previous level of function without restriction secondary to condition.   OBJECTIVE IMPAIRMENTS: decreased mobility, difficulty walking, decreased ROM, decreased strength, prosthetic dependency , and pain.   ACTIVITY LIMITATIONS: lifting, bending, sitting, squatting, and reach over head  PARTICIPATION LIMITATIONS: community activity and occupation  PERSONAL FACTORS: see PMH above are also affecting patient's functional outcome.   REHAB POTENTIAL: Good  CLINICAL DECISION MAKING: Stable/uncomplicated  EVALUATION COMPLEXITY: Low   GOALS: Goals reviewed with patient? Yes  SHORT TERM GOALS: (target date for Short term goals are 3 weeks 10/08/2024)  1.Patient will demonstrate independent use of home exercise program to maintain progress from in clinic treatments. Goal status: New  LONG TERM GOALS: (target dates for all long term goals are 10 weeks  11/29/24 )   1. Patient will demonstrate/report pain at worst less than or equal to 2/10 to facilitate minimal limitation in daily activity secondary to pain symptoms. Goal status: New   2. Patient will demonstrate independent use of home exercise program to facilitate ability to maintain/progress functional gains from skilled physical therapy services. Goal status: New   3. Patient will demonstrate Patient specific functional scale avg > or = 5 to indicate reduced disability due to condition.  Goal status: New   4.  Patient will demonstrate lifting using  correct body mechanics 10 pounds from floor to counter surface with no pain.   Goal status: New   5.  Patient will demonstrate improved left  shoulder flexion and abduction strength by 5 pounds using HHD for improved functional mobility and reaching  activities.    Goal status: New   6.  Pt will report she is able to stand for >/= 20 minutes with back pain </= 3/10.  Goal status: New     PLAN:  PT FREQUENCY: 1x/week  PT DURATION: 10 weeks  PLANNED INTERVENTIONS: Can include 02853- PT Re-evaluation, 97110-Therapeutic exercises, 97530- Therapeutic activity, [id number]- Neuromuscular re-education, 97535- Self Care, 97140- Manual therapy, [phone]- Gait training, [phone]- Orthotic Fit/training, [phone]- Canalith repositioning, [id number]- Aquatic Therapy, [phone]- Electrical stimulation (unattended), [id number] Physical performance testing, 97016- Vasopneumatic device, [id number]- Ultrasound, [id number]- Traction (mechanical), [id number]- Ionotophoresis 4mg /ml Dexamethasone,  20560 - Needle insertion w/o injection 1 or 2 muscles, 20561 - Needle insertion w/o injection 3 or more muscles.   Patient/Family education, Balance training, Stair training, Taping, Dry Needling, Joint mobilization, Joint manipulation, Spinal manipulation, Spinal mobilization, Scar mobilization, Vestibular training, Visual/preceptual remediation/compensation, DME instructions, Cryotherapy, and Moist heat.  All performed as medically necessary.  All included unless contraindicated  PLAN FOR NEXT SESSION: Review HEP knowledge/results.     Ozell Silvan, PT, DPT, OCS, ATC 10/15/24  10:48 AM    Date of referral: Theophilus Andrews, Tully GRADE, MD  Referring provider: Georgina Ozell LABOR, MD  Referring diagnosis?  Diagnosis  M25.512 (ICD-10-CM) - Periscapular pain of left shoulder   Treatment diagnosis? (if different than referring diagnosis) m54.6, m25.512, m62.81, m54.59  What was this (referring dx) caused by? Ongoing Issue  Lysle of Condition:  Initial Onset (within last 3 months)   Laterality: Lt  Current Functional Measure Score: Patient Specific Functional Scale 3  Objective measurements identify impairments when they are compared to normal values, the uninvolved extremity, and prior level of function.  [x]  Yes  []  No  Objective assessment of functional ability: Minimal functional limitations   Briefly describe symptoms: left parascapular pain, mid thoracic pain, low back pain, difficulty with standing, reaching and bending  How did symptoms start: over time over the last year or greater  Average pain intensity:  Last 24 hours: 8/10  Past week: 8/10  How often does the pt experience symptoms? Frequently  How much have the symptoms interfered with usual daily activities? Moderately  How has condition changed since care began at this facility? NA - initial visit  In general, how is the patients overall health? Good   BACK PAIN (STarT Back Screening Tool) Has pain spread down the leg(s) at some time in the last 2 weeks? no Has there been pain in the shoulder or neck at some time in the last 2 weeks? yes Has the pt only walked short distances because of back pain? yes Has patient dressed more slowly because of back pain in the past 2 weeks? yes Does patient think it's not safe for a person with this condition to be physically active? no Does patient have worrying thoughts a lot of the time? no Does patient feel back pain is terrible and will never get any better? no Has patient stopped enjoying things they usually enjoy? sometimes

## 2024-10-22 ENCOUNTER — Telehealth: Payer: Self-pay | Admitting: Physical Therapy

## 2024-10-22 ENCOUNTER — Encounter: Admitting: Physical Therapy

## 2024-10-22 NOTE — Telephone Encounter (Signed)
 I called to follow up with pt after she missed her 9:30 PT appointment. I left a message with our clinic number [phone] for pt to reschedule if she wished.  Delon Lunger, PT MPT 10/22/2024 9:55 AM

## 2024-10-24 ENCOUNTER — Ambulatory Visit: Admitting: Orthopedic Surgery

## 2024-10-24 DIAGNOSIS — M5416 Radiculopathy, lumbar region: Secondary | ICD-10-CM

## 2024-10-24 MED ORDER — GABAPENTIN 100 MG PO CAPS: 100.0000 mg | ORAL_CAPSULE | Freq: Three times a day (TID) | ORAL | 2 refills | Status: DC

## 2024-10-24 NOTE — Progress Notes (Signed)
 Orthopedic Spine Surgery Office Note   Assessment: Patient is a 68 y.o. female with left periscapular back pain. Feels it around the level of T4 in the periscapular musculature. Also, having significant pain in her lumbar spine and paraspinal muscles     Plan: -Patient has tried  PT, tylenol , ibuprofen , trigger point injection, gabapentin , tramadol  -Patient did notice improvement with the trigger point injection but only lasted for a week.  She also has noticed improvement when she does deep massage to the area.  I told her to keep doing that she can use a tennis ball along the wall to do that.  She should continue to desensitize the area -In regards to her low back, she has had several years of pain in spite of six weeks of treatment including a home exercise program, PT, tramadol , gabapentin , so recommended a MRI of the lumbar spine to evaluate further -Prescribed more gabapentin  since she has found that helpful -Patient should return to office in 4-5 weeks, x-rays at next visit: none     Patient expressed understanding of the plan and all questions were answered to the patient's satisfaction.    ___________________________________________________________________________     History:   Patient is a 68 y.o. female who comes in today for routine follow-up.  She still has pain over the left periscapular region.  She notes it has improved.  She got the injection with me in the office and felt improvement for about a week.  She is also been working with PT and doing a home exercise program.  She finds that using a tennis ball along the ball to massage the area has been helpful.  She notices pain every 2 to 3 days.  Her more concerning symptom is pain in her low back.  She has had pain for about 3 years now.  She feels it on a daily basis.  It is worse with bending.  She has not noticed any significant relief with the gabapentin , tramadol , home exercise program.  She has no pain radiating into  either lower extremity.    Treatments tried: PT, tylenol , ibuprofen , trigger point injection, tramadol      Physical Exam:   General: no acute distress, appears stated age Neurologic: alert, answering questions appropriately, following commands Respiratory: unlabored breathing on room air, symmetric chest rise Psychiatric: appropriate affect, normal cadence to speech     MSK (spine):   -Strength exam                                                   Left                  Right EHL                              5/5                  5/5 TA                                 5/5                  5/5 GSC  5/5                  5/5 Knee extension            5/5                  5/5 Hip flexion                    5/5                  5/5   -Sensory exam                           Sensation intact to light touch in L3-S1 nerve distributions of bilateral lower extremities    Imaging: XRs of the thoracic spine from 09/05/2024 were previously independently reviewed and interpreted, showing chronic appearing compression fracture in the lower thoracic spine.  No acute fracture seen.  No dislocation seen.  No spondylolisthesis seen.  Physiologic kyphosis.   XRs of the lumbar spine from 09/05/2024 were previously independently reviewed and interpreted, showing no evidence of instability on flexion/extension views.  No fracture or dislocation seen.  Disc height loss at L5/S1.     Patient name: Lisa Mcconnell Patient MRN: [id number] Date of visit: 10/24/2024

## 2024-10-25 ENCOUNTER — Telehealth: Payer: Self-pay

## 2024-10-25 MED ORDER — GABAPENTIN 100 MG PO CAPS: 100.0000 mg | ORAL_CAPSULE | Freq: Three times a day (TID) | ORAL | 2 refills | Status: AC

## 2024-10-25 NOTE — Addendum Note (Signed)
 Addended by: GEORGINA SHARPER on: 10/25/2024 04:06 PM   Modules accepted: Orders

## 2024-10-25 NOTE — Telephone Encounter (Signed)
 Belvie, pharmacy tech with [facility] is calling about the Gabapentin  100 mg capsules Rx sent to them yesterday. With trying to process the Rx, he said it looks like the patient does not have eligibility with [facility]. He tried calling the patient, but was unable to leave a vm. Belvie is asking if the Rx was possibly sent to them in error.   He is asking for a call back regarding this at #[phone], ref #[id number].

## 2024-10-29 ENCOUNTER — Encounter: Admitting: Physical Therapy

## 2024-11-05 ENCOUNTER — Encounter: Admitting: Physical Therapy

## 2024-11-11 ENCOUNTER — Other Ambulatory Visit: Payer: Self-pay | Admitting: Internal Medicine

## 2024-11-11 NOTE — Telephone Encounter (Signed)
 Copied from CRM #[id number]. Topic: Clinical - Medication Refill >> Nov 11, 2024  2:05 PM Macario HERO wrote: Medication: pravastatin  (PRAVACHOL ) 40 MG tablet [[id number]]  Has the patient contacted their pharmacy? No (Agent: If no, request that the patient contact the pharmacy for the refill. If patient does not wish to contact the pharmacy document the reason why and proceed with request.) (Agent: If yes, when and what did the pharmacy advise?)  This is the patient's preferred pharmacy:    [id number] [place], [place] - [address] AT [address]NER OF [facility] [address] [place] [address] Phone: [phone] Fax: [phone]  [facility] - [place], [place] - [address] [place] [address] Phone: [phone] Fax: [phone]  Is this the correct pharmacy for this prescription? Yes If no, delete pharmacy and type the correct one.   Has the prescription been filled recently? Yes  Is the patient out of the medication? Yes  Has the patient been seen for an appointment in the last year OR does the patient have an upcoming appointment? Yes  Can we respond through MyChart? No  Agent: Please be advised that Rx refills may take up to 3 business days. We ask that you follow-up with your pharmacy.

## 2024-11-12 ENCOUNTER — Encounter: Admitting: Physical Therapy

## 2024-11-12 MED ORDER — PRAVASTATIN SODIUM 40 MG PO TABS: 40.0000 mg | ORAL_TABLET | Freq: Every day | ORAL | 1 refills | Status: AC

## 2024-11-19 ENCOUNTER — Encounter: Admitting: Physical Therapy

## 2024-11-26 ENCOUNTER — Encounter: Admitting: Physical Therapy

## 2024-12-03 ENCOUNTER — Encounter: Admitting: Physical Therapy

## 2024-12-09 NOTE — Progress Notes (Unsigned)
" ° °  12/09/2024  Patient ID: Beckey JINNY Hirschfeld, female   DOB: 1956-04-06, 69 y.o.   MRN: [id number]  Pharmacy Quality Measure Review  This patient is appearing on a report for being at risk of failing the adherence measure for cholesterol (statin) medications this calendar year.   Medication: Pravastatin  Last fill date: 11/12/24 for 90 day supply  Insurance report was not up to date. No action needed at this time.   Jon VEAR Lindau, PharmD Clinical Pharmacist [phone]   "

## 2024-12-10 ENCOUNTER — Encounter: Admitting: Physical Therapy

## 2024-12-11 ENCOUNTER — Ambulatory Visit: Admitting: Orthopedic Surgery

## 2024-12-11 ENCOUNTER — Telehealth: Payer: Self-pay

## 2024-12-11 DIAGNOSIS — I10 Essential (primary) hypertension: Secondary | ICD-10-CM

## 2024-12-11 NOTE — Telephone Encounter (Unsigned)
 Copied from CRM #[id number]. Topic: Clinical - Medication Refill >> Dec 11, 2024  5:03 PM Hadassah PARAS wrote: Medication: losartan -hydrochlorothiazide  (HYZAAR) 100-25 MG tablet  Has the patient contacted their pharmacy? Yes (Agent: If no, request that the patient contact the pharmacy for the refill. If patient does not wish to contact the pharmacy document the reason why and proceed with request.) (Agent: If yes, when and what did the pharmacy advise?)  This is the patient's preferred pharmacy:    [id number] - [place], [place] - [address] AT [facility] [address] [place] [address] Phone: [phone] Fax: [phone]   Is this the correct pharmacy for this prescription? Yes If no, delete pharmacy and type the correct one.   Has the prescription been filled recently? No  Is the patient out of the medication? Yes  Has the patient been seen for an appointment in the last year OR does the patient have an upcoming appointment? Yes  Can we respond through MyChart? No  Agent: Please be advised that Rx refills may take up to 3 business days. We ask that you follow-up with your pharmacy. >> Dec 11, 2024  5:05 PM Hadassah PARAS wrote: Pt took last pill yesterday. Please expedite

## 2024-12-12 MED ORDER — LOSARTAN POTASSIUM-HCTZ 100-25 MG PO TABS: 1.0000 | ORAL_TABLET | Freq: Every day | ORAL | 1 refills | Status: AC

## 2024-12-12 NOTE — Telephone Encounter (Signed)
 Refill sent

## 2024-12-12 NOTE — Addendum Note (Signed)
 Addended by: KATHRYNE MILLMAN B on: 12/12/2024 10:32 AM   Modules accepted: Orders

## 2024-12-26 ENCOUNTER — Ambulatory Visit: Admitting: Podiatry
